# Patient Record
Sex: Female | Born: 1935
Health system: Southern US, Community
[De-identification: ages and names within clinical notes are randomized; demographics above are authoritative.]

## PROBLEM LIST (undated history)

## (undated) DIAGNOSIS — E039 Hypothyroidism, unspecified: Secondary | ICD-10-CM

## (undated) DIAGNOSIS — N182 Chronic kidney disease, stage 2 (mild): Secondary | ICD-10-CM

## (undated) DIAGNOSIS — J189 Pneumonia, unspecified organism: Secondary | ICD-10-CM

## (undated) DIAGNOSIS — F329 Major depressive disorder, single episode, unspecified: Secondary | ICD-10-CM

## (undated) DIAGNOSIS — J209 Acute bronchitis, unspecified: Secondary | ICD-10-CM

## (undated) DIAGNOSIS — N390 Urinary tract infection, site not specified: Secondary | ICD-10-CM

## (undated) DIAGNOSIS — I129 Hypertensive chronic kidney disease with stage 1 through stage 4 chronic kidney disease, or unspecified chronic kidney disease: Secondary | ICD-10-CM

## (undated) DIAGNOSIS — E785 Hyperlipidemia, unspecified: Secondary | ICD-10-CM

## (undated) DIAGNOSIS — I10 Essential (primary) hypertension: Secondary | ICD-10-CM

## (undated) DIAGNOSIS — R059 Cough, unspecified: Secondary | ICD-10-CM

## (undated) DIAGNOSIS — M810 Age-related osteoporosis without current pathological fracture: Secondary | ICD-10-CM

## (undated) DIAGNOSIS — N189 Chronic kidney disease, unspecified: Secondary | ICD-10-CM

## (undated) DIAGNOSIS — R05 Cough: Secondary | ICD-10-CM

## (undated) HISTORY — DX: Hyperlipidemia, unspecified: E78.5

## (undated) HISTORY — DX: Hypothyroidism, unspecified: E03.9

## (undated) HISTORY — DX: Chronic kidney disease, unspecified: N18.9

## (undated) HISTORY — DX: Urinary tract infection, site not specified: N39.0

## (undated) HISTORY — DX: Pneumonia, unspecified organism: J18.9

## (undated) HISTORY — DX: Major depressive disorder, single episode, unspecified: F32.9

## (undated) HISTORY — DX: Acute bronchitis, unspecified: J20.9

## (undated) HISTORY — DX: Hypertensive chronic kidney disease with stage 1 through stage 4 chronic kidney disease, or unspecified chronic kidney disease: I12.9

## (undated) HISTORY — DX: Chronic kidney disease, stage 2 (mild): N18.2

## (undated) HISTORY — DX: Cough, unspecified: R05.9

## (undated) HISTORY — DX: Essential (primary) hypertension: I10

## (undated) HISTORY — DX: Age-related osteoporosis without current pathological fracture: M81.0

---
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
## 2008-06-24 ENCOUNTER — Ambulatory Visit (HOSPITAL_COMMUNITY): Admission: RE | Admit: 2008-06-24 | Discharge: 2008-06-24 | Payer: Self-pay | Admitting: Pulmonary Disease

## 2008-07-04 ENCOUNTER — Emergency Department (HOSPITAL_COMMUNITY): Admission: EM | Admit: 2008-07-04 | Discharge: 2008-07-04 | Payer: Self-pay | Admitting: Emergency Medicine

## 2009-04-14 ENCOUNTER — Encounter: Payer: Self-pay | Admitting: Orthopedic Surgery

## 2009-04-14 ENCOUNTER — Emergency Department (HOSPITAL_COMMUNITY): Admission: EM | Admit: 2009-04-14 | Discharge: 2009-04-14 | Payer: Self-pay | Admitting: Emergency Medicine

## 2009-04-15 ENCOUNTER — Ambulatory Visit: Payer: Self-pay | Admitting: Orthopedic Surgery

## 2009-04-15 DIAGNOSIS — S52539A Colles' fracture of unspecified radius, initial encounter for closed fracture: Secondary | ICD-10-CM | POA: Insufficient documentation

## 2009-04-21 ENCOUNTER — Ambulatory Visit: Payer: Self-pay | Admitting: Orthopedic Surgery

## 2009-04-30 ENCOUNTER — Ambulatory Visit: Payer: Self-pay | Admitting: Orthopedic Surgery

## 2009-05-28 ENCOUNTER — Ambulatory Visit: Payer: Self-pay | Admitting: Orthopedic Surgery

## 2009-06-25 ENCOUNTER — Telehealth: Payer: Self-pay | Admitting: Orthopedic Surgery

## 2009-07-10 ENCOUNTER — Ambulatory Visit: Payer: Self-pay | Admitting: Orthopedic Surgery

## 2010-06-02 NOTE — Assessment & Plan Note (Signed)
Summary: RE-CK/XRAY WRIST/FX CARE/HUMANA/CAF   Visit Type:  Follow-up  CC:  right wrist fracture.  History of Present Illness:  DOI: 04/14/09 right wrist fracture.  TREATMENT: Cast, then brace  Not taking any medicines right now  Complains of no pain  Exam shows 50 extension 60 flexion of the RIGHT wrist no tenderness at the fracture site  X-rays ordered 3 views RIGHT wrist  X-ray show healed distal radius fracture mild alignment changes vs. normal wrist but acceptable  Doing well follow up as needed       Allergies: No Known Drug Allergies   Impression & Recommendations:  Problem # 1:  COLLES' FRACTURE, RIGHT (ICD-813.41) Assessment Improved  Orders: Post-Op Check (16109) Wrist x-ray complete, minimum 3 views (60454)  Patient Instructions: 1)  Please schedule a follow-up appointment as needed.

## 2010-06-02 NOTE — Assessment & Plan Note (Signed)
Summary: RE-CK/XRAY RT WRIST/HUMANA/CAF   Visit Type:  Follow-up  CC:  right wrist fracture.Marland Kitchen  History of Present Illness:  DOI: 04/14/09 right wrist fracture.  TREATMENT: Cast  MEDS: None  Scheduled for:  Xrays OOP.  xrays Show that the fracture still has just a hint of visibility so I've recommended a brace  AP lateral oblique RIGHT wrist shows a distal radius fracture which is healing with good callus formation, alignment is normal  Impression healing distal radius fracture    Allergies: No Known Drug Allergies   Impression & Recommendations:  Problem # 1:  COLLES' FRACTURE, RIGHT (ICD-813.41) Assessment Improved  Orders: Post-Op Check (09811) Wrist x-ray complete, minimum 3 views (91478)  Patient Instructions: 1)  Splint x 4 weeks then re-xray right wrist

## 2010-06-02 NOTE — Progress Notes (Signed)
Summary: postpone appointment  Phone Note Call from Patient   Summary of Call: Catherine West (04/10/1921) not able to come to her 4 week follow-up with  xr appointment, said she will call back when she feels better

## 2013-10-14 ENCOUNTER — Emergency Department (HOSPITAL_COMMUNITY): Payer: Medicare Other

## 2013-10-14 ENCOUNTER — Emergency Department (HOSPITAL_COMMUNITY)
Admission: EM | Admit: 2013-10-14 | Discharge: 2013-10-14 | Disposition: A | Discharge status: Home / Self Care | Payer: Medicare Other | Attending: Emergency Medicine | Admitting: Emergency Medicine

## 2013-10-14 ENCOUNTER — Encounter (HOSPITAL_COMMUNITY): Payer: Self-pay | Admitting: Emergency Medicine

## 2013-10-14 DIAGNOSIS — M25519 Pain in unspecified shoulder: Secondary | ICD-10-CM | POA: Insufficient documentation

## 2013-10-14 DIAGNOSIS — I10 Essential (primary) hypertension: Secondary | ICD-10-CM | POA: Insufficient documentation

## 2013-10-14 DIAGNOSIS — R079 Chest pain, unspecified: Secondary | ICD-10-CM | POA: Insufficient documentation

## 2013-10-14 DIAGNOSIS — Z79899 Other long term (current) drug therapy: Secondary | ICD-10-CM | POA: Insufficient documentation

## 2013-10-14 DIAGNOSIS — M549 Dorsalgia, unspecified: Secondary | ICD-10-CM

## 2013-10-14 DIAGNOSIS — M7520 Bicipital tendinitis, unspecified shoulder: Secondary | ICD-10-CM | POA: Insufficient documentation

## 2013-10-14 DIAGNOSIS — M7522 Bicipital tendinitis, left shoulder: Secondary | ICD-10-CM

## 2013-10-14 HISTORY — DX: Essential (primary) hypertension: I10

## 2013-10-14 LAB — CBC WITH DIFFERENTIAL/PLATELET
BASOS ABS: 0 10*3/uL (ref 0.0–0.1)
Basophils Relative: 0 % (ref 0–1)
EOS PCT: 0 % (ref 0–5)
Eosinophils Absolute: 0 10*3/uL (ref 0.0–0.7)
HCT: 43 % (ref 36.0–46.0)
Hemoglobin: 14.6 g/dL (ref 12.0–15.0)
LYMPHS ABS: 1.5 10*3/uL (ref 0.7–4.0)
LYMPHS PCT: 14 % (ref 12–46)
MCH: 33 pg (ref 26.0–34.0)
MCHC: 34 g/dL (ref 30.0–36.0)
MCV: 97.3 fL (ref 78.0–100.0)
Monocytes Absolute: 1.3 10*3/uL — ABNORMAL HIGH (ref 0.1–1.0)
Monocytes Relative: 13 % — ABNORMAL HIGH (ref 3–12)
NEUTROS ABS: 7.6 10*3/uL (ref 1.7–7.7)
NEUTROS PCT: 73 % (ref 43–77)
PLATELETS: 171 10*3/uL (ref 150–400)
RBC: 4.42 MIL/uL (ref 3.87–5.11)
RDW: 13.6 % (ref 11.5–15.5)
WBC: 10.4 10*3/uL (ref 4.0–10.5)

## 2013-10-14 LAB — BASIC METABOLIC PANEL
BUN: 18 mg/dL (ref 6–23)
CALCIUM: 8.9 mg/dL (ref 8.4–10.5)
CO2: 25 meq/L (ref 19–32)
CREATININE: 0.85 mg/dL (ref 0.50–1.10)
Chloride: 102 mEq/L (ref 96–112)
GFR, EST AFRICAN AMERICAN: 67 mL/min — AB (ref >90)
GFR, EST NON AFRICAN AMERICAN: 58 mL/min — AB (ref >90)
Glucose, Bld: 109 mg/dL — ABNORMAL HIGH (ref 70–99)
Potassium: 4.1 mEq/L (ref 3.7–5.3)
SODIUM: 139 meq/L (ref 137–147)

## 2013-10-14 LAB — TROPONIN I

## 2013-10-14 MED ORDER — IOHEXOL 300 MG/ML  SOLN
80.0000 mL | Freq: Once | INTRAMUSCULAR | Status: AC | PRN
  Administered 2013-10-14: 80 mL via INTRAVENOUS

## 2013-10-14 NOTE — Discharge Instructions (Signed)
CT scan of chest shows inflammation and possible scarring.   Radiologist recommends a repeat CT scan in 3-6 months.     Discuss this with Dr. Juanetta GoslingHawkins.      He can arrange followup.

## 2013-10-14 NOTE — ED Provider Notes (Signed)
CSN: [id number]     Arrival date & time 10/14/13  1416 History  This chart was scribed for Donnetta Hutching, MD by Karle Plumber, ED Scribe. This patient was seen in room [facility] and the patient's care was started at 3:00 PM.  Chief Complaint  Patient presents with  . Back Pain   The history is provided by the patient. No language interpreter was used.   HPI Comments:  Catherine West is a 80 y.o. female with h/o HTN who presents to the Emergency Department complaining of unchanging moderate left shoulder and mid back pain at T9 area upon inspiration that started approximately three weeks ago. She states that lying makes her back pain worse. Movement of her left arm makes the shoulder pain worse. Pt states her daughter made her come in today. Daughter states she called Dr. Romeo Apple, her orthopedist she has seen previously, and has an appt in the next 10 days. She denies fever, SOB, CP, or weakness. She states her PCP is Dr. Juanetta Gosling.  Past Medical History  Diagnosis Date  . HTN (hypertension)    History reviewed. No pertinent past surgical history. No family history on file. History  Substance Use Topics  . Smoking status: Never Smoker   . Smokeless tobacco: Never Used  . Alcohol Use: No   OB History   Grav Para Term Preterm Abortions TAB SAB Ect Mult Living                 Review of Systems A complete 10 system review of systems was obtained and all systems are negative except as noted in the HPI and PMH.   Allergies  Aspirin  Home Medications   Prior to Admission medications   Medication Sig Start Date End Date Taking? Authorizing Provider  BYSTOLIC 10 MG tablet Take 10 mg by mouth daily. 10/03/13  Yes Historical Provider, MD  sertraline (ZOLOFT) 50 MG tablet Take 50 mg by mouth every other day.  07/28/13  Yes Historical Provider, MD   Triage Vitals: BP 219/65  Pulse 57  Temp(Src) 97.8 F (36.6 C) (Oral)  Resp 20  Ht 5\' 4"  (1.626 m)  Wt 142 lb (64.411 kg)  BMI 24.36  kg/m2  SpO2 93% Physical Exam  Nursing note and vitals reviewed. Constitutional: She is oriented to person, place, and time. She appears well-developed and well-nourished.  HENT:  Head: Normocephalic and atraumatic.  Eyes: Conjunctivae and EOM are normal. Pupils are equal, round, and reactive to light.  Neck: Normal range of motion. Neck supple.  Cardiovascular: Normal rate, regular rhythm and normal heart sounds.   Pulmonary/Chest: Effort normal and breath sounds normal.  Abdominal: Soft. Bowel sounds are normal.  Musculoskeletal: Normal range of motion. She exhibits tenderness. She exhibits no edema.  Proximal medial humerus tendon tenderness. Full ROM. Right sided back nontender.  Neurological: She is alert and oriented to person, place, and time.  Skin: Skin is warm and dry.  Psychiatric: She has a normal mood and affect. Her behavior is normal.    ED Course  Procedures (including critical care time) DIAGNOSTIC STUDIES: Oxygen Saturation is 93% on RA, low by my interpretation.   COORDINATION OF CARE: 3:07 PM- Will order CXR and lab work. Pt verbalizes understanding and agrees to plan.  Medications  iohexol (OMNIPAQUE) 300 MG/ML solution 80 mL (80 mLs Intravenous Contrast Given 10/14/13 1648)    Labs Review Labs Reviewed  BASIC METABOLIC PANEL - Abnormal; Notable for the following:    Glucose, Bld  109 (*)    GFR calc non Af Amer 58 (*)    GFR calc Af Amer 67 (*)    All other components within normal limits  CBC WITH DIFFERENTIAL - Abnormal; Notable for the following:    Monocytes Relative 13 (*)    Monocytes Absolute 1.3 (*)    All other components within normal limits  TROPONIN I    Imaging Review Dg Chest 2 View  10/14/2013   CLINICAL DATA:  Right back shoulder and scapular pain. Intermittent fever.  EXAM: CHEST  2 VIEW  COMPARISON:  Two-view chest 06/24/2008.  FINDINGS: Mild cardiomegaly is present. Right-sided pleural thickening or loculated effusion is noted.  Mild biapical scarring is otherwise stable. Asymmetric right upper lobe airspace disease is evident.  A remote T4 compression fracture is evident. The visualized soft tissues and bony thorax are otherwise unremarkable. Atherosclerotic calcifications are noted in the aortic arch.  IMPRESSION: 1. Right upper lobe pleural opacification. This may represent a loculated effusion or infection. A chest wall mass is not excluded. Recommend CT of the chest with contrast for further evaluation. 2. Asymmetric right upper lobe airspace disease may represent atelectasis or early infection. 3. Borderline cardiomegaly without failure. 4. Atherosclerosis.   Electronically Signed   By: Gennette Pachris  Mattern M.D.   On: 10/14/2013 15:47   Ct Chest W Contrast  10/14/2013   CLINICAL DATA:  Left shoulder and arm pain.  Abnormal chest x-ray.  EXAM: CT CHEST WITH CONTRAST  TECHNIQUE: Multidetector CT imaging of the chest was performed during intravenous contrast administration.  CONTRAST:  80mL OMNIPAQUE IOHEXOL 300 MG/ML  SOLN  COMPARISON:  Radiographs today, 07/04/2008 and 06/24/2008.  FINDINGS: There is patchy subpleural airspace disease at the right apex, corresponding with the radiographic density. No well-defined mass or chest wall extension is identified. The appearance is most consistent with postinflammatory scarring although a degree of active inflammation not excluded. Small nodular densities are present elsewhere in the right upper, right middle and left upper lobes.  The heart is enlarged. There is atherosclerosis of the aorta, great vessels and coronary arteries. There are mildly enlarged superior mediastinal lymph nodes, including a high right paratracheal node measuring 11 mm short axis on coronal image 47. There is a 10 mm pre tracheal node on image 20. No hilar adenopathy is apparent. There is no pleural or pericardial effusion.  Images through the upper abdomen demonstrate an 11 mm left adrenal nodule, likely an adenoma.  There is atherosclerosis of the abdominal aorta.  Chronic T4 compression deformity is again noted. No acute osseous findings are seen.  IMPRESSION: 1. Right apical subpleural density most likely represents postinflammatory scarring, although could indicate active inflammation. No focal mass or chest wall extension is identified. 2. Nonspecific mildly enlarged superior mediastinal lymph nodes. 3. No pleural or pericardial effusion. 4. Scattered atherosclerosis. 5. To evaluate the stability of these findings, chest CT follow-up in 3-6 months is recommended.   Electronically Signed   By: Roxy HorsemanBill  Veazey M.D.   On: 10/14/2013 17:07     EKG Interpretation   Date/Time:  Sunday October 14 2013 14:42:44 EDT Ventricular Rate:  54 PR Interval:  210 QRS Duration: 145 QT Interval:  478 QTC Calculation: 453 R Axis:   -44 Text Interpretation:  Sinus rhythm Left bundle branch block Baseline  wander in lead(s) V3 Confirmed by Thora Scherman  MD, Ahlia Lemanski ([id number]) on 10/14/2013  3:51:48 PM      MDM   Final diagnoses:  Back pain  Biceps  tendinitis on left   CT chest with contrast reveals a right apical subpleural density most likely postinflammatory scarring.  This was discussed with the patient and her daughter. She will follow up with her primary care Dr. Juanetta GoslingHawkins for a followup CT chest in 3-6 months. She is hemodynamically stable.  I personally performed the services described in this documentation, which was scribed in my presence. The recorded information has been reviewed and is accurate.    Donnetta HutchingBrian Sayge Salvato, MD 10/15/13 1058

## 2013-10-14 NOTE — ED Notes (Signed)
Patient with no complaints at this time. Respirations even and unlabored. Skin warm/dry. Discharge instructions reviewed with patient at this time. Patient given opportunity to voice concerns/ask questions. IV removed per policy and band-aid applied to site. Patient discharged at this time and left Emergency Department with steady gait.  

## 2013-10-14 NOTE — ED Notes (Signed)
PT c/o back pain when pt breathes in for about a week. PT c/o left shoulder pain x3 weeks.

## 2013-10-18 ENCOUNTER — Telehealth: Payer: Self-pay | Admitting: Orthopedic Surgery

## 2013-10-18 NOTE — Telephone Encounter (Signed)
Patient called to cancel her upcoming appointment, for 11/20/13 for shoulder pain. States feeling better, said had gone to Emergency Room, had Xrays there, and was told "tendonitis."  I offered to keep this visit so that she can be evaluated orthopedically, states will just call back to re-schedule if needed.

## 2013-11-20 ENCOUNTER — Ambulatory Visit: Payer: Medicare Other | Admitting: Orthopedic Surgery

## 2015-02-23 ENCOUNTER — Emergency Department (HOSPITAL_COMMUNITY)
Admission: EM | Admit: 2015-02-23 | Discharge: 2015-02-23 | Disposition: A | Discharge status: Home / Self Care | Payer: Medicare Other | Attending: Emergency Medicine | Admitting: Emergency Medicine

## 2015-02-23 ENCOUNTER — Encounter (HOSPITAL_COMMUNITY): Payer: Self-pay | Admitting: Emergency Medicine

## 2015-02-23 DIAGNOSIS — I10 Essential (primary) hypertension: Secondary | ICD-10-CM | POA: Diagnosis not present

## 2015-02-23 DIAGNOSIS — Z79899 Other long term (current) drug therapy: Secondary | ICD-10-CM | POA: Insufficient documentation

## 2015-02-23 LAB — CBC
HCT: 45.6 % (ref 36.0–46.0)
Hemoglobin: 15.3 g/dL — ABNORMAL HIGH (ref 12.0–15.0)
MCH: 33.3 pg (ref 26.0–34.0)
MCHC: 33.6 g/dL (ref 30.0–36.0)
MCV: 99.3 fL (ref 78.0–100.0)
Platelets: 191 K/uL (ref 150–400)
RBC: 4.59 MIL/uL (ref 3.87–5.11)
RDW: 13.5 % (ref 11.5–15.5)
WBC: 8.8 K/uL (ref 4.0–10.5)

## 2015-02-23 LAB — TROPONIN I

## 2015-02-23 LAB — BASIC METABOLIC PANEL WITH GFR
Anion gap: 7 (ref 5–15)
BUN: 18 mg/dL (ref 6–20)
CO2: 28 mmol/L (ref 22–32)
Calcium: 9.4 mg/dL (ref 8.9–10.3)
Chloride: 107 mmol/L (ref 101–111)
Creatinine, Ser: 1.01 mg/dL — ABNORMAL HIGH (ref 0.44–1.00)
GFR calc Af Amer: 54 mL/min — ABNORMAL LOW
GFR calc non Af Amer: 47 mL/min — ABNORMAL LOW
Glucose, Bld: 94 mg/dL (ref 65–99)
Potassium: 4.3 mmol/L (ref 3.5–5.1)
Sodium: 142 mmol/L (ref 135–145)

## 2015-02-23 MED ORDER — HYDRALAZINE HCL 20 MG/ML IJ SOLN
INTRAMUSCULAR | Status: AC
  Administered 2015-02-23: 10 mg via INTRAVENOUS
  Filled 2015-02-23: qty 1

## 2015-02-23 MED ORDER — HYDRALAZINE HCL 20 MG/ML IJ SOLN
10.0000 mg | Freq: Once | INTRAMUSCULAR | Status: AC
  Administered 2015-02-23: 10 mg via INTRAVENOUS

## 2015-02-23 MED ORDER — LISINOPRIL 10 MG PO TABS: 10.0000 mg | ORAL_TABLET | Freq: Every day | ORAL | Status: DC

## 2015-02-23 NOTE — ED Notes (Signed)
Patient states she woke this morning at 0830 with dizziness. States she checked her blood [facility] and [facility]. Patient denies pain. States dizziness has resolved since.

## 2015-02-23 NOTE — Discharge Instructions (Signed)

## 2015-02-23 NOTE — ED Provider Notes (Signed)
CSN: [id number]     Arrival date & time 02/23/15  1207 History   First MD Initiated Contact with Patient 02/23/15 1242     Chief Complaint  Patient presents with  . Hypertension     (Consider location/radiation/quality/duration/timing/severity/associated sxs/prior Treatment) HPI Comments: The patient is a 81 year old West, she has a history of hypertension which has been well-controlled on Bystolic for 7 years.  She reports that this morning she had 2 discrete episodes where she felt off balance and dizzy. She reports that this was a lightheadedness, there was no vertigo, no nausea, no weakness or numbness and no changes in her vision. It was short-lived each time, she had her family members take her to the pharmacy where it was found that her blood pressure was 223/120 as taken manually by a provider at [facility]. She reports that all of her symptoms have resolved, she had no chest pain palpitation shortness of breath back pain neck pain abdominal pain diarrhea rectal bleeding dysuria swelling rashes numbness or weakness. There was never any difficulty speaking or changes in vision. At this time she has no complaints. She has not missed any of her medications, she has not started any new medications.  Patient is a 81 y.o. West presenting with hypertension. The history is provided by the patient and a relative.  Hypertension    Past Medical History  Diagnosis Date  . HTN (hypertension)    History reviewed. No pertinent past surgical history. History reviewed. No pertinent family history. Social History  Substance Use Topics  . Smoking status: Never Smoker   . Smokeless tobacco: Never Used  . Alcohol Use: No   OB History    No data available     Review of Systems  All other systems reviewed and are negative.     Allergies  Aspirin  Home Medications   Prior to Admission medications   Medication Sig Start Date End Date Taking? Authorizing Provider  BYSTOLIC 10 MG  tablet Take 10 mg by mouth daily. 10/03/13  Yes Historical Provider, MD  sertraline (ZOLOFT) 50 MG tablet Take 25 mg by mouth every other day.  07/28/13  Yes Historical Provider, MD  lisinopril (PRINIVIL,ZESTRIL) 10 MG tablet Take 1 tablet (10 mg total) by mouth daily. 02/23/15   Eber Hong, MD   BP 144/63 mmHg  Pulse 60  Temp(Src) 97.8 F (36.6 C) (Oral)  Resp 21  Ht  (1.651 m)  Wt 142 lb (64.411 kg)  BMI 23.63 kg/m2  SpO2 93% Physical Exam  Constitutional: She appears well-developed and well-nourished. No distress.  HENT:  Head: Normocephalic and atraumatic.  Mouth/Throat: Oropharynx is clear and moist. No oropharyngeal exudate.  Eyes: Conjunctivae and EOM are normal. Pupils are equal, round, and reactive to light. Right eye exhibits no discharge. Left eye exhibits no discharge. No scleral icterus.  Neck: Normal range of motion. Neck supple. No JVD present. No thyromegaly present.  Cardiovascular: Normal rate, regular rhythm, normal heart sounds and intact distal pulses.  Exam reveals no gallop and no friction rub.   No murmur heard. Pulmonary/Chest: Effort normal and breath sounds normal. No respiratory distress. She has no wheezes. She has no rales.  Abdominal: Soft. Bowel sounds are normal. She exhibits no distension and no mass. There is no tenderness.  Musculoskeletal: Normal range of motion. She exhibits no edema or tenderness.  Lymphadenopathy:    She has no cervical adenopathy.  Neurological: She is alert. Coordination normal.  Speech is clear, cranial nerves III  through XII are intact, memory is intact, strength is normal in all 4 extremities including grips, sensation is intact to light touch and pinprick in all 4 extremities. Coordination as tested by finger-nose-finger is normal, no limb ataxia. Normal gait, normal reflexes at the patellar tendons bilaterally  Skin: Skin is warm and dry. No rash noted. No erythema.  Psychiatric: She has a normal mood and affect. Her  behavior is normal.  Nursing note and vitals reviewed.   ED Course  Procedures (including critical care time) Labs Review Labs Reviewed  CBC - Abnormal; Notable for the following:    Hemoglobin 15.3 (*)    All other components within normal limits  BASIC METABOLIC PANEL - Abnormal; Notable for the following:    Creatinine, Ser 1.01 (*)    GFR calc non Af Amer 47 (*)    GFR calc Af Amer 54 (*)    All other components within normal limits  TROPONIN I    Imaging Review No results found. I have personally reviewed and evaluated these images and lab results as part of my medical decision-making.   EKG Interpretation   Date/Time:  Sunday February 23 2015 13:15:42 EDT Ventricular Rate:  61 PR Interval:  205 QRS Duration: 148 QT Interval:  497 QTC Calculation: 501 R Axis:   21 Text Interpretation:  Sinus rhythm Left bundle branch block since last  tracing no significant change Confirmed by Duvan Mousel  MD, Brin Ruggerio ([id number]) on  02/23/2015 1:27:49 PM      MDM   Final diagnoses:  Essential hypertension    The patient has a benign exam, her blood pressure is severely elevated, this will need to be rechecked, check kidney function, EKG and troponin, patient may need to have increased medication coverage for blood pressure, will ambulate after labs obtained. She has no symptoms at this time. Reassuring exam.  After a single dose of blood pressure medication, the patient improved significantly, has no more symptoms, is feeling well and is stable for discharge. I reviewed all the patient's labs with her, she has been given a copy of her results and will follow-up with Dr. Juanetta GoslingHawkins this week. She will start lisinopril, I discussed with her the risk of angioedema, she is agreeable to this risk and will start taking lisinopril tomorrow.  Meds given in ED:  Medications  hydrALAZINE (APRESOLINE) injection 10 mg (10 mg Intravenous Given 02/23/15 1400)    New Prescriptions   LISINOPRIL  (PRINIVIL,ZESTRIL) 10 MG TABLET    Take 1 tablet (10 mg total) by mouth daily.        Eber HongBrian Shavonne Ambroise, MD 02/23/15 [phone]

## 2015-02-25 ENCOUNTER — Encounter (HOSPITAL_BASED_OUTPATIENT_CLINIC_OR_DEPARTMENT_OTHER): Payer: Self-pay | Admitting: *Deleted

## 2015-02-25 ENCOUNTER — Emergency Department (HOSPITAL_BASED_OUTPATIENT_CLINIC_OR_DEPARTMENT_OTHER)
Admission: EM | Admit: 2015-02-25 | Discharge: 2015-02-25 | Disposition: A | Discharge status: Home / Self Care | Payer: Medicare Other | Attending: Emergency Medicine | Admitting: Emergency Medicine

## 2015-02-25 DIAGNOSIS — Z79899 Other long term (current) drug therapy: Secondary | ICD-10-CM | POA: Diagnosis not present

## 2015-02-25 DIAGNOSIS — I159 Secondary hypertension, unspecified: Secondary | ICD-10-CM | POA: Insufficient documentation

## 2015-02-25 DIAGNOSIS — I10 Essential (primary) hypertension: Secondary | ICD-10-CM | POA: Diagnosis present

## 2015-02-25 NOTE — ED Notes (Signed)
States she was seen at [facility] 2 days ago after waking up with dizziness. Her BP was elevated and she was treated for HTN and inner ear. Her MD increased her BP medication yesterday. [facility] and [facility]. Her daughter brought her here. Pt does not have any complaints.

## 2015-02-25 NOTE — ED Provider Notes (Signed)
CSN: [id number]     Arrival date & time 02/25/15  1351 History   First MD Initiated Contact with Patient 02/25/15 1403     Chief Complaint  Patient presents with  . Hypertension     (Consider location/radiation/quality/duration/timing/severity/associated sxs/prior Treatment) HPI Comments: Patient recently seen for high blood pressure. She is taking one blood pressure medication for many years and was seen in the ER 2 days ago for very elevated blood pressure. She was given IV hydralazine with improvement in her blood pressure and went home with a new prescription for lisinopril. She saw her doctor yesterday who increased her lisinopril. She went [facility] for a BP check and it was elevated again today and they sent her to the ER. Patient feels well, she denies any CP, SOB, N/V, headache.   Patient is a 81 y.o. female presenting with hypertension. The history is provided by the patient.  Hypertension This is a chronic problem. The current episode started more than 1 week ago. The problem occurs constantly. The problem has not changed since onset.Pertinent negatives include no shortness of breath. Nothing aggravates the symptoms. Nothing relieves the symptoms.    Past Medical History  Diagnosis Date  . HTN (hypertension)    History reviewed. No pertinent past surgical history. No family history on file. Social History  Substance Use Topics  . Smoking status: Never Smoker   . Smokeless tobacco: Never Used  . Alcohol Use: No   OB History    No data available     Review of Systems  Constitutional: Negative for fever.  Respiratory: Negative for cough and shortness of breath.   All other systems reviewed and are negative.     Allergies  Aspirin  Home Medications   Prior to Admission medications   Medication Sig Start Date End Date Taking? Authorizing Provider  BYSTOLIC 10 MG tablet Take 10 mg by mouth daily. 10/03/13   Historical Provider, MD  lisinopril (PRINIVIL,ZESTRIL)  10 MG tablet Take 1 tablet (10 mg total) by mouth daily. 02/23/15   Eber HongBrian Miller, MD  sertraline (ZOLOFT) 50 MG tablet Take 25 mg by mouth every other day.  07/28/13   Historical Provider, MD   BP 236/96 mmHg  Pulse 66  Temp(Src) 98.4 F (36.9 C) (Oral)  Resp 20  Wt 142 lb (64.411 kg)  SpO2 95% Physical Exam  Constitutional: She is oriented to person, place, and time. She appears well-developed and well-nourished. No distress.  HENT:  Head: Normocephalic and atraumatic.  Mouth/Throat: Oropharynx is clear and moist.  Eyes: EOM are normal. Pupils are equal, round, and reactive to light.  Neck: Normal range of motion. Neck supple.  Cardiovascular: Normal rate and regular rhythm.  Exam reveals no friction rub.   No murmur heard. Pulmonary/Chest: Effort normal and breath sounds normal. No respiratory distress. She has no wheezes. She has no rales.  Abdominal: Soft. She exhibits no distension. There is no tenderness. There is no rebound.  Musculoskeletal: Normal range of motion. She exhibits no edema.  Neurological: She is alert and oriented to person, place, and time.  Skin: No rash noted. She is not diaphoretic.  Nursing note and vitals reviewed.   ED Course  Procedures (including critical care time) Labs Review Labs Reviewed - No data to display  Imaging Review No results found. I have personally reviewed and evaluated these images and lab results as part of my medical decision-making.   EKG Interpretation None      MDM   Final  diagnoses:  Secondary hypertension, unspecified    Patient is here with high blood pressure. She's been seen 3 times over the past 3 days for her high blood pressure. She saw her primary care doctor yesterday who increased her lisinopril after being seen in the ER the day before. She's here again with high blood pressure. She feels well and has no symptoms. I spoke with her doctor, Dr. Juanetta Gosling, who recommended she continue the blood pressure is able  take time for it to drop her blood pressure. He recommended checking her blood pressure at home and not inappropriate place with they will always recommend going to the ER. I agree with this as the patient feels well.  Exam here benign. Stable for discharge.    Elwin Mocha, MD 02/25/15 [phone]

## 2015-02-25 NOTE — ED Notes (Signed)
Pt. Just started on MEDS yesterday.

## 2015-02-25 NOTE — Discharge Instructions (Signed)
Hypertension Hypertension, commonly called high blood pressure, is when the force of blood pumping through your arteries is too strong. Your arteries are the blood vessels that carry blood from your heart throughout your body. A blood pressure reading consists of a higher number over a lower number, such as 110/72. The higher number (systolic) is the pressure inside your arteries when your heart pumps. The lower number (diastolic) is the pressure inside your arteries when your heart relaxes. Ideally you want your blood pressure below 120/80. Hypertension forces your heart to work harder to pump blood. Your arteries may become narrow or stiff. Having untreated or uncontrolled hypertension can cause heart attack, stroke, kidney disease, and other problems. RISK FACTORS Some risk factors for high blood pressure are controllable. Others are not.  Risk factors you cannot control include:   Race. You may be at higher risk if you are African American.  Age. Risk increases with age.  Gender. Men are at higher risk than women before age 45 years. After age 65, women are at higher risk than men. Risk factors you can control include:  Not getting enough exercise or physical activity.  Being overweight.  Getting too much fat, sugar, calories, or salt in your diet.  Drinking too much alcohol. SIGNS AND SYMPTOMS Hypertension does not usually cause signs or symptoms. Extremely high blood pressure (hypertensive crisis) may cause headache, anxiety, shortness of breath, and nosebleed. DIAGNOSIS To check if you have hypertension, your health care provider will measure your blood pressure while you are seated, with your arm held at the level of your heart. It should be measured at least twice using the same arm. Certain conditions can cause a difference in blood pressure between your right and left arms. A blood pressure reading that is higher than normal on one occasion does not mean that you need treatment. If  it is not clear whether you have high blood pressure, you may be asked to return on a different day to have your blood pressure checked again. Or, you may be asked to monitor your blood pressure at home for 1 or more weeks. TREATMENT Treating high blood pressure includes making lifestyle changes and possibly taking medicine. Living a healthy lifestyle can help lower high blood pressure. You may need to change some of your habits. Lifestyle changes may include:  Following the DASH diet. This diet is high in fruits, vegetables, and whole grains. It is low in salt, red meat, and added sugars.  Keep your sodium intake below 2,300 mg per day.  Getting at least 30-45 minutes of aerobic exercise at least 4 times per week.  Losing weight if necessary.  Not smoking.  Limiting alcoholic beverages.  Learning ways to reduce stress. Your health care provider may prescribe medicine if lifestyle changes are not enough to get your blood pressure under control, and if one of the following is true:  You are 18-59 years of age and your systolic blood pressure is above 140.  You are 60 years of age or older, and your systolic blood pressure is above 150.  Your diastolic blood pressure is above 90.  You have diabetes, and your systolic blood pressure is over 140 or your diastolic blood pressure is over 90.  You have kidney disease and your blood pressure is above 140/90.  You have heart disease and your blood pressure is above 140/90. Your personal target blood pressure may vary depending on your medical conditions, your age, and other factors. HOME CARE INSTRUCTIONS    Have your blood pressure rechecked as directed by your health care provider.   Take medicines only as directed by your health care provider. Follow the directions carefully. Blood pressure medicines must be taken as prescribed. The medicine does not work as well when you skip doses. Skipping doses also puts you at risk for  problems.  Do not smoke.   Monitor your blood pressure at home as directed by your health care provider. SEEK MEDICAL CARE IF:   You think you are having a reaction to medicines taken.  You have recurrent headaches or feel dizzy.  You have swelling in your ankles.  You have trouble with your vision. SEEK IMMEDIATE MEDICAL CARE IF:  You develop a severe headache or confusion.  You have unusual weakness, numbness, or feel faint.  You have severe chest or abdominal pain.  You vomit repeatedly.  You have trouble breathing. MAKE SURE YOU:   Understand these instructions.  Will watch your condition.  Will get help right away if you are not doing well or get worse.   This information is not intended to replace advice given to you by your health care provider. Make sure you discuss any questions you have with your health care provider.   Document Released: 04/19/2005 Document Revised: 09/03/2014 Document Reviewed: 02/09/2013 Elsevier Interactive Patient Education 2016 [employer]  

## 2015-09-04 ENCOUNTER — Other Ambulatory Visit (HOSPITAL_COMMUNITY): Payer: Self-pay | Admitting: Pulmonary Disease

## 2015-09-04 DIAGNOSIS — Z780 Asymptomatic menopausal state: Secondary | ICD-10-CM

## 2015-09-11 ENCOUNTER — Ambulatory Visit (HOSPITAL_COMMUNITY)
Admission: RE | Admit: 2015-09-11 | Discharge: 2015-09-11 | Disposition: A | Discharge status: Home / Self Care | Payer: Medicare Other | Source: Ambulatory Visit | Attending: Pulmonary Disease | Admitting: Pulmonary Disease

## 2015-09-11 DIAGNOSIS — Z780 Asymptomatic menopausal state: Secondary | ICD-10-CM | POA: Insufficient documentation

## 2015-09-11 DIAGNOSIS — M810 Age-related osteoporosis without current pathological fracture: Secondary | ICD-10-CM | POA: Insufficient documentation

## 2015-09-11 LAB — HM DEXA SCAN

## 2015-10-10 LAB — FECAL OCCULT BLOOD, GUAIAC: Fecal Occult Blood: NEGATIVE

## 2016-02-24 DIAGNOSIS — Z23 Encounter for immunization: Secondary | ICD-10-CM | POA: Diagnosis not present

## 2016-03-08 DIAGNOSIS — E785 Hyperlipidemia, unspecified: Secondary | ICD-10-CM | POA: Diagnosis not present

## 2016-03-08 DIAGNOSIS — Z23 Encounter for immunization: Secondary | ICD-10-CM | POA: Diagnosis not present

## 2016-03-08 DIAGNOSIS — I10 Essential (primary) hypertension: Secondary | ICD-10-CM | POA: Diagnosis not present

## 2016-03-08 DIAGNOSIS — F321 Major depressive disorder, single episode, moderate: Secondary | ICD-10-CM | POA: Diagnosis not present

## 2016-05-04 DIAGNOSIS — I10 Essential (primary) hypertension: Secondary | ICD-10-CM | POA: Diagnosis not present

## 2016-05-04 DIAGNOSIS — F321 Major depressive disorder, single episode, moderate: Secondary | ICD-10-CM | POA: Diagnosis not present

## 2016-09-06 DIAGNOSIS — Z0000 Encounter for general adult medical examination without abnormal findings: Secondary | ICD-10-CM | POA: Diagnosis not present

## 2016-11-01 DIAGNOSIS — F321 Major depressive disorder, single episode, moderate: Secondary | ICD-10-CM | POA: Diagnosis not present

## 2016-11-01 DIAGNOSIS — E039 Hypothyroidism, unspecified: Secondary | ICD-10-CM | POA: Diagnosis not present

## 2016-11-01 DIAGNOSIS — I10 Essential (primary) hypertension: Secondary | ICD-10-CM | POA: Diagnosis not present

## 2016-11-01 DIAGNOSIS — E785 Hyperlipidemia, unspecified: Secondary | ICD-10-CM | POA: Diagnosis not present

## 2017-02-16 DIAGNOSIS — Z23 Encounter for immunization: Secondary | ICD-10-CM | POA: Diagnosis not present

## 2017-03-09 DIAGNOSIS — I10 Essential (primary) hypertension: Secondary | ICD-10-CM | POA: Diagnosis not present

## 2017-03-09 DIAGNOSIS — E785 Hyperlipidemia, unspecified: Secondary | ICD-10-CM | POA: Diagnosis not present

## 2017-03-09 DIAGNOSIS — N182 Chronic kidney disease, stage 2 (mild): Secondary | ICD-10-CM | POA: Diagnosis not present

## 2017-03-09 DIAGNOSIS — I129 Hypertensive chronic kidney disease with stage 1 through stage 4 chronic kidney disease, or unspecified chronic kidney disease: Secondary | ICD-10-CM | POA: Diagnosis not present

## 2017-05-05 DIAGNOSIS — I10 Essential (primary) hypertension: Secondary | ICD-10-CM | POA: Diagnosis not present

## 2017-05-05 DIAGNOSIS — N182 Chronic kidney disease, stage 2 (mild): Secondary | ICD-10-CM | POA: Diagnosis not present

## 2017-05-05 DIAGNOSIS — E785 Hyperlipidemia, unspecified: Secondary | ICD-10-CM | POA: Diagnosis not present

## 2017-09-08 DIAGNOSIS — Z0000 Encounter for general adult medical examination without abnormal findings: Secondary | ICD-10-CM | POA: Diagnosis not present

## 2018-02-22 ENCOUNTER — Emergency Department (HOSPITAL_COMMUNITY)
Admission: EM | Admit: 2018-02-22 | Discharge: 2018-02-22 | Disposition: A | Discharge status: Home / Self Care | Payer: Medicare Other | Attending: Emergency Medicine | Admitting: Emergency Medicine

## 2018-02-22 ENCOUNTER — Encounter (HOSPITAL_COMMUNITY): Payer: Self-pay | Admitting: Emergency Medicine

## 2018-02-22 ENCOUNTER — Emergency Department (HOSPITAL_COMMUNITY): Payer: Medicare Other

## 2018-02-22 ENCOUNTER — Other Ambulatory Visit: Payer: Self-pay

## 2018-02-22 DIAGNOSIS — S0003XA Contusion of scalp, initial encounter: Secondary | ICD-10-CM | POA: Insufficient documentation

## 2018-02-22 DIAGNOSIS — Y92008 Other place in unspecified non-institutional (private) residence as the place of occurrence of the external cause: Secondary | ICD-10-CM | POA: Insufficient documentation

## 2018-02-22 DIAGNOSIS — T148XXA Other injury of unspecified body region, initial encounter: Secondary | ICD-10-CM

## 2018-02-22 DIAGNOSIS — Y9389 Activity, other specified: Secondary | ICD-10-CM | POA: Diagnosis not present

## 2018-02-22 DIAGNOSIS — W1839XA Other fall on same level, initial encounter: Secondary | ICD-10-CM | POA: Diagnosis not present

## 2018-02-22 DIAGNOSIS — Y999 Unspecified external cause status: Secondary | ICD-10-CM | POA: Diagnosis not present

## 2018-02-22 DIAGNOSIS — N3001 Acute cystitis with hematuria: Secondary | ICD-10-CM | POA: Diagnosis not present

## 2018-02-22 DIAGNOSIS — S0291XA Unspecified fracture of skull, initial encounter for closed fracture: Secondary | ICD-10-CM | POA: Diagnosis not present

## 2018-02-22 DIAGNOSIS — Z79899 Other long term (current) drug therapy: Secondary | ICD-10-CM | POA: Insufficient documentation

## 2018-02-22 DIAGNOSIS — S0990XA Unspecified injury of head, initial encounter: Secondary | ICD-10-CM | POA: Diagnosis not present

## 2018-02-22 DIAGNOSIS — I10 Essential (primary) hypertension: Secondary | ICD-10-CM | POA: Insufficient documentation

## 2018-02-22 DIAGNOSIS — I447 Left bundle-branch block, unspecified: Secondary | ICD-10-CM | POA: Diagnosis not present

## 2018-02-22 DIAGNOSIS — R55 Syncope and collapse: Secondary | ICD-10-CM | POA: Diagnosis not present

## 2018-02-22 DIAGNOSIS — Z23 Encounter for immunization: Secondary | ICD-10-CM | POA: Diagnosis not present

## 2018-02-22 DIAGNOSIS — W19XXXA Unspecified fall, initial encounter: Secondary | ICD-10-CM | POA: Diagnosis not present

## 2018-02-22 DIAGNOSIS — R0902 Hypoxemia: Secondary | ICD-10-CM | POA: Diagnosis not present

## 2018-02-22 DIAGNOSIS — S199XXA Unspecified injury of neck, initial encounter: Secondary | ICD-10-CM | POA: Diagnosis not present

## 2018-02-22 LAB — CBC WITH DIFFERENTIAL/PLATELET
ABS IMMATURE GRANULOCYTES: 0.09 10*3/uL — AB (ref 0.00–0.07)
BASOS PCT: 0 %
Basophils Absolute: 0.1 10*3/uL (ref 0.0–0.1)
Eosinophils Absolute: 0.1 10*3/uL (ref 0.0–0.5)
Eosinophils Relative: 1 %
HEMATOCRIT: 44.3 % (ref 36.0–46.0)
HEMOGLOBIN: 14.4 g/dL (ref 12.0–15.0)
Immature Granulocytes: 1 %
LYMPHS ABS: 1.6 10*3/uL (ref 0.7–4.0)
Lymphocytes Relative: 10 %
MCH: 33.3 pg (ref 26.0–34.0)
MCHC: 32.5 g/dL (ref 30.0–36.0)
MCV: 102.3 fL — AB (ref 80.0–100.0)
MONO ABS: 1 10*3/uL (ref 0.1–1.0)
MONOS PCT: 6 %
NEUTROS ABS: 13.7 10*3/uL — AB (ref 1.7–7.7)
Neutrophils Relative %: 82 %
PLATELETS: 194 10*3/uL (ref 150–400)
RBC: 4.33 MIL/uL (ref 3.87–5.11)
RDW: 13.2 % (ref 11.5–15.5)
WBC: 16.5 10*3/uL — ABNORMAL HIGH (ref 4.0–10.5)
nRBC: 0 % (ref 0.0–0.2)

## 2018-02-22 LAB — URINALYSIS, ROUTINE W REFLEX MICROSCOPIC
Glucose, UA: NEGATIVE mg/dL
NITRITE: POSITIVE — AB
Protein, ur: >300 mg/dL — AB
Specific Gravity, Urine: 1.015 (ref 1.005–1.030)
pH: 6.5 (ref 5.0–8.0)

## 2018-02-22 LAB — COMPREHENSIVE METABOLIC PANEL
ALK PHOS: 87 U/L (ref 38–126)
ALT: 15 U/L (ref 0–44)
AST: 29 U/L (ref 15–41)
Albumin: 4 g/dL (ref 3.5–5.0)
Anion gap: 9 (ref 5–15)
BILIRUBIN TOTAL: 0.6 mg/dL (ref 0.3–1.2)
BUN: 26 mg/dL — AB (ref 8–23)
CALCIUM: 9.2 mg/dL (ref 8.9–10.3)
CO2: 27 mmol/L (ref 22–32)
Chloride: 101 mmol/L (ref 98–111)
Creatinine, Ser: 1.35 mg/dL — ABNORMAL HIGH (ref 0.44–1.00)
GFR, EST AFRICAN AMERICAN: 37 mL/min — AB (ref >60)
GFR, EST NON AFRICAN AMERICAN: 32 mL/min — AB (ref >60)
Glucose, Bld: 130 mg/dL — ABNORMAL HIGH (ref 70–99)
Potassium: 3.3 mmol/L — ABNORMAL LOW (ref 3.5–5.1)
Sodium: 137 mmol/L (ref 135–145)
TOTAL PROTEIN: 7.8 g/dL (ref 6.5–8.1)

## 2018-02-22 LAB — PROTIME-INR
INR: 0.95
PROTHROMBIN TIME: 12.6 s (ref 11.4–15.2)

## 2018-02-22 LAB — URINALYSIS, MICROSCOPIC (REFLEX): RBC / HPF: >50 RBC/hpf (ref 0–5)

## 2018-02-22 LAB — CK: Total CK: 130 U/L (ref 38–234)

## 2018-02-22 MED ORDER — CEPHALEXIN 250 MG PO CAPS: 500.0000 mg | ORAL_CAPSULE | Freq: Three times a day (TID) | ORAL | 0 refills | Status: DC

## 2018-02-22 MED ORDER — SODIUM CHLORIDE 0.9 % IV SOLN
1.0000 g | Freq: Once | INTRAVENOUS | Status: AC
  Administered 2018-02-22: 1 g via INTRAVENOUS
  Filled 2018-02-22: qty 10

## 2018-02-22 MED ORDER — SODIUM CHLORIDE 0.9 % IV BOLUS
500.0000 mL | Freq: Once | INTRAVENOUS | Status: AC
  Administered 2018-02-22: 500 mL via INTRAVENOUS

## 2018-02-22 MED ORDER — TETANUS-DIPHTH-ACELL PERTUSSIS 5-2.5-18.5 LF-MCG/0.5 IM SUSP
0.5000 mL | Freq: Once | INTRAMUSCULAR | Status: AC
  Administered 2018-02-22: 0.5 mL via INTRAMUSCULAR
  Filled 2018-02-22: qty 0.5

## 2018-02-22 NOTE — Discharge Instructions (Signed)
See your primary care doctor in 1 week.

## 2018-02-22 NOTE — ED Provider Notes (Signed)
[facility] EMERGENCY DEPARTMENT Provider Note   CSN: [id number] Arrival date & time: 02/22/18  0031     History   Chief Complaint Chief Complaint  Patient presents with  . Fall    HPI Catherine West is a 84 y.o. female.  HPI 84 year old female comes in with chief complaint of fall. Patient has history of hypertension.  According to the patient she had a fall in her porch while trying to enter her house.  Patient does not know why she fell down, but she is absolutely sure that she did not faint as she remembers hitting her head.  Patient was unable to get up after the fall because of weakness.   Patient had left her daughter's house sometime ago, and the daughter did not receive the call back as she normally does therefore she went to check on her mother and found her on the porch.  Patient denies any pain including headache.  She is bleeding in the back of her head.  She denies any numbness, chest pain, shortness of breath, focal weakness, back pain, abdominal pain.  Patient is not on any blood thinners.   Past Medical History:  Diagnosis Date  . HTN (hypertension)     Patient Active Problem List   Diagnosis Date Noted  . COLLES' FRACTURE, RIGHT 04/15/2009    History reviewed. No pertinent surgical history.   OB History   None      Home Medications    Prior to Admission medications   Medication Sig Start Date End Date Taking? Authorizing Provider  BYSTOLIC 10 MG tablet Take 10 mg by mouth daily. 10/03/13   [provider]  lisinopril (PRINIVIL,ZESTRIL) 10 MG tablet Take 1 tablet (10 mg total) by mouth daily. 02/23/15   Eber Hong, MD  sertraline (ZOLOFT) 50 MG tablet Take 25 mg by mouth every other day.  07/28/13   [provider]    Family History No family history on file.  Social History Social History   Tobacco Use  . Smoking status: Never Smoker  . Smokeless tobacco: Never Used  Substance Use Topics  . Alcohol use: No  .  Drug use: No     Allergies   Aspirin   Review of Systems Review of Systems  Constitutional: Negative for activity change.  Respiratory: Negative for shortness of breath.   Cardiovascular: Negative for chest pain.  Gastrointestinal: Negative for abdominal pain.  Genitourinary: Negative for dysuria and flank pain.  Musculoskeletal: Negative for arthralgias, back pain and myalgias.  Skin: Positive for wound.  Hematological: Does not bruise/bleed easily.     Physical Exam Updated Vital Signs BP (!) 170/92   Pulse 64   Temp 98.5 F (36.9 C) (Oral)   Resp 16   Ht 5\' 5"  (1.651 m)   Wt 64.4 kg   SpO2 93%   BMI 23.63 kg/m   Physical Exam  Constitutional: She is oriented to person, place, and time. She appears well-developed.  HENT:  Head: Normocephalic and atraumatic.  Patient has a large hematoma and bleeding from her occiput region.  There is significant clotting around the back of her head  Eyes: EOM are normal.  Neck: Neck supple.  Cardiovascular: Normal rate.  Pulmonary/Chest: Effort normal and breath sounds normal.  Abdominal: Soft. Bowel sounds are normal. There is no tenderness.  Musculoskeletal: She exhibits no edema, tenderness or deformity.  Small abrasion over the left elbow and dorsal hand  Neurological: She is alert and oriented to person,  place, and time.  Skin: Skin is warm and dry.  Nursing note and vitals reviewed.    ED Treatments / Results  Labs (all labs ordered are listed, but only abnormal results are displayed) Labs Reviewed  CBC WITH DIFFERENTIAL/PLATELET - Abnormal; Notable for the following components:      Result Value   WBC 16.5 (*)    MCV 102.3 (*)    Neutro Abs 13.7 (*)    Abs Immature Granulocytes 0.09 (*)    All other components within normal limits  COMPREHENSIVE METABOLIC PANEL - Abnormal; Notable for the following components:   Potassium 3.3 (*)    Glucose, Bld 130 (*)    BUN 26 (*)    Creatinine, Ser 1.35 (*)    GFR calc  non Af Amer 32 (*)    GFR calc Af Amer 37 (*)    All other components within normal limits  URINALYSIS, ROUTINE W REFLEX MICROSCOPIC - Abnormal; Notable for the following components:   Color, Urine BROWN (*)    APPearance CLOUDY (*)    Hgb urine dipstick LARGE (*)    Bilirubin Urine MODERATE (*)    Ketones, ur TRACE (*)    Protein, ur >300 (*)    Nitrite POSITIVE (*)    Leukocytes, UA MODERATE (*)    All other components within normal limits  URINALYSIS, MICROSCOPIC (REFLEX) - Abnormal; Notable for the following components:   Bacteria, UA MANY (*)    All other components within normal limits  PROTIME-INR  CK    EKG EKG Interpretation  Date/Time:  Wednesday February 22 2018 02:11:41 EDT Ventricular Rate:  61 PR Interval:    QRS Duration: 154 QT Interval:  503 QTC Calculation: 507 R Axis:   -22 Text Interpretation:  Sinus rhythm Prolonged PR interval Left bundle branch block No significant change since last tracing Confirmed by Derwood Kaplan ([id number]) on 02/22/2018 4:30:47 AM   Radiology Dg Chest 2 View  Result Date: 02/22/2018 CLINICAL DATA:  Found down. EXAM: CHEST - 2 VIEW COMPARISON:  10/14/2013 FINDINGS: Mild cardiomegaly. No confluent airspace opacities, effusions or edema. No acute bony abnormality. IMPRESSION: Cardiomegaly.  No active disease. Electronically Signed   By: Charlett Nose M.D.   On: 02/22/2018 02:42   Ct Head Wo Contrast  Result Date: 02/22/2018 CLINICAL DATA:  Fall, hematoma to back of head. EXAM: CT HEAD WITHOUT CONTRAST CT CERVICAL SPINE WITHOUT CONTRAST TECHNIQUE: Multidetector CT imaging of the head and cervical spine was performed following the standard protocol without intravenous contrast. Multiplanar CT image reconstructions of the cervical spine were also generated. COMPARISON:  07/04/2008 FINDINGS: CT HEAD FINDINGS Brain: There is atrophy and chronic small vessel disease changes. No acute intracranial abnormality. Specifically, no hemorrhage,  hydrocephalus, mass lesion, acute infarction, or significant intracranial injury. Vascular: No hyperdense vessel or unexpected calcification. Skull: No acute calvarial abnormality. Sinuses/Orbits: Visualized paranasal sinuses and mastoids clear. Orbital soft tissues unremarkable. Other: Large posterior right scalp hematoma. CT CERVICAL SPINE FINDINGS Alignment: No subluxation Skull base and vertebrae: No acute fracture. No primary bone lesion or focal pathologic process. Soft tissues and spinal canal: No prevertebral fluid or swelling. No visible canal hematoma. Disc levels: Degenerative disc disease changes with disc space narrowing and spurring at C5-6. Mild bilateral degenerative facet disease. Upper chest: No acute findings Other: Carotid artery calcifications. IMPRESSION: No acute intracranial abnormality. Atrophy, chronic microvascular disease. Right posterior scalp hematoma. Degenerative changes in the cervical spine. No acute bony abnormality. Electronically Signed   By:  Charlett Nose M.D.   On: 02/22/2018 02:38   Ct Cervical Spine Wo Contrast  Result Date: 02/22/2018 CLINICAL DATA:  Fall, hematoma to back of head. EXAM: CT HEAD WITHOUT CONTRAST CT CERVICAL SPINE WITHOUT CONTRAST TECHNIQUE: Multidetector CT imaging of the head and cervical spine was performed following the standard protocol without intravenous contrast. Multiplanar CT image reconstructions of the cervical spine were also generated. COMPARISON:  07/04/2008 FINDINGS: CT HEAD FINDINGS Brain: There is atrophy and chronic small vessel disease changes. No acute intracranial abnormality. Specifically, no hemorrhage, hydrocephalus, mass lesion, acute infarction, or significant intracranial injury. Vascular: No hyperdense vessel or unexpected calcification. Skull: No acute calvarial abnormality. Sinuses/Orbits: Visualized paranasal sinuses and mastoids clear. Orbital soft tissues unremarkable. Other: Large posterior right scalp hematoma. CT  CERVICAL SPINE FINDINGS Alignment: No subluxation Skull base and vertebrae: No acute fracture. No primary bone lesion or focal pathologic process. Soft tissues and spinal canal: No prevertebral fluid or swelling. No visible canal hematoma. Disc levels: Degenerative disc disease changes with disc space narrowing and spurring at C5-6. Mild bilateral degenerative facet disease. Upper chest: No acute findings Other: Carotid artery calcifications. IMPRESSION: No acute intracranial abnormality. Atrophy, chronic microvascular disease. Right posterior scalp hematoma. Degenerative changes in the cervical spine. No acute bony abnormality. Electronically Signed   By: Charlett Nose M.D.   On: 02/22/2018 02:38    Procedures .Marland KitchenIncision and Drainage Date/Time: 02/22/2018 3:52 AM Performed by: Derwood Kaplan, MD Authorized by: Derwood Kaplan, MD   Consent:    Consent obtained:  Verbal   Consent given by:  Patient   Risks discussed:  Bleeding and incomplete drainage Universal protocol:    Procedure explained and questions answered to patient or proxy's satisfaction: yes   Location:    Type:  Hematoma   Size:  4 CM   Location:  Head   Head location:  Scalp Procedure type:    Complexity:  Complex Procedure details:    Needle aspiration: yes     Needle size:  18 G   Incision depth:  Dermal   Wound management:  Irrigated with saline and extensive cleaning   Drainage:  Bloody   Drainage amount:  Moderate   Packing materials:  None Post-procedure details:    Patient tolerance of procedure:  Tolerated well, no immediate complications   (including critical care time)  Medications Ordered in ED Medications  cefTRIAXone (ROCEPHIN) 1 g in sodium chloride 0.9 % 100 mL IVPB (1 g Intravenous New Bag/Given 02/22/18 0413)  Tdap (BOOSTRIX) injection 0.5 mL (0.5 mLs Intramuscular Given 02/22/18 0208)  sodium chloride 0.9 % bolus 500 mL (0 mLs Intravenous Stopped 02/22/18 0340)     Initial Impression /  Assessment and Plan / ED Course  I have reviewed the triage vital signs and the nursing notes.  Pertinent labs & imaging results that were available during my care of the patient were reviewed by me and considered in my medical decision making (see chart for details).  Clinical Course as of Feb 22 429  Wed Feb 22, 2018  0152 Nursing team alerted me on the urine findings.  They noted that patient had a prolapsed bladder and her urine was bloody with some tissue in it.  I went and assessed the patient.  She indeed has both prolapsed uterus and rectum.  Patient states that " it has always been like this".  She does not have any pain in that area.  I asked her if she was aware that her urine  was bloody and patient denied.  She still does not have any UTI-like symptoms.  Repeat abdominal exam completed and is still nontender.  There is no ecchymosis over her abdomen and the abdomen is soft.  Patient does not want anything done for the prolapsed organs, and states that she has not even mentioned it to her primary care doctor.  We informed her that 1 of her concerns was that when she fell that could have led to elevated intra-peritoneal pressure that might have prolapse or bladder -but patient does not think that happened. We will check UA. We will now get basic labs. Vital signs do not show tachycardia and her abdomen does not have any peritoneal signs.  Since patient does not want anything done about the prolapse, I do not see any reason to get a CT scan in light of her not having any tenderness.  If patient's hemoglobin is significantly low, then we will get CT blunt trauma protocol ordered.  We will also get CT head and C-spine.  Nursing team to clean up the wound.   [AN]  0351 No internal bleeding seen.  CT Head Wo Contrast [AN]  0351 Patient has a nitrite positive urine with leukocytes, many bacteria and hematuria.  She might be having cystitis that led to the gross hematuria. We will give her  antibiotics.  Nitrite(!): POSITIVE [AN]    Clinical Course User Index [AN] Derwood Kaplan, MD    84 year old female comes in with chief complaint of fall. She has no complaints.  She thinks she had a mechanical fall, and is sure that she did not faint.  Patient remembers striking her head onto the porch.  It appears to me that patient might be downplaying her symptoms.  It is possible that she might have fainted and that is why she fell in first place.  Other possibilities that she has concussion and has some retrograde amnesia.  She has no cardiovascular history and denies any recent dizziness, palpitations, chest pain, shortness of breath.  We will place her on cardiac monitor and get an EKG.   CT head and C-spine have been ordered to ensure there is no critical injury in those areas.  Patient's wound will be cleaned up and then repaired.  Patient has some skin abrasions, otherwise she has no deformity and her range of motion is normal over upper extremity and lower extremities.  There is no focal tenderness either.  Abdomen is soft and there is no ecchymosis over the torso.  Final Clinical Impressions(s) / ED Diagnoses   Final diagnoses:  Acute cystitis with hematuria  Hematoma  Fall, initial encounter    ED Discharge Orders    None       Derwood Kaplan, MD 02/22/18 [phone]

## 2018-02-22 NOTE — ED Triage Notes (Addendum)
Pt states she left her daughters house around 2130 tonight. Family was unable to get in touch with her by phone. Family went to pts house and found pt on the ground around 0000. Pt has hematoma to the back of the head. Pt C/O pain to the back of the head. Pt unsure of why/how she fell.

## 2018-02-22 NOTE — ED Notes (Signed)
Patient transported to CT 

## 2018-03-16 DIAGNOSIS — Z0000 Encounter for general adult medical examination without abnormal findings: Secondary | ICD-10-CM | POA: Diagnosis not present

## 2018-03-16 DIAGNOSIS — F321 Major depressive disorder, single episode, moderate: Secondary | ICD-10-CM | POA: Diagnosis not present

## 2018-03-16 DIAGNOSIS — I10 Essential (primary) hypertension: Secondary | ICD-10-CM | POA: Diagnosis not present

## 2018-03-20 DIAGNOSIS — N390 Urinary tract infection, site not specified: Secondary | ICD-10-CM | POA: Diagnosis not present

## 2018-04-17 DIAGNOSIS — N390 Urinary tract infection, site not specified: Secondary | ICD-10-CM | POA: Diagnosis not present

## 2018-06-22 DIAGNOSIS — N390 Urinary tract infection, site not specified: Secondary | ICD-10-CM | POA: Diagnosis not present

## 2018-06-22 DIAGNOSIS — I129 Hypertensive chronic kidney disease with stage 1 through stage 4 chronic kidney disease, or unspecified chronic kidney disease: Secondary | ICD-10-CM | POA: Diagnosis not present

## 2018-06-22 DIAGNOSIS — F321 Major depressive disorder, single episode, moderate: Secondary | ICD-10-CM | POA: Diagnosis not present

## 2018-06-22 DIAGNOSIS — E785 Hyperlipidemia, unspecified: Secondary | ICD-10-CM | POA: Diagnosis not present

## 2018-09-20 DIAGNOSIS — Z0000 Encounter for general adult medical examination without abnormal findings: Secondary | ICD-10-CM | POA: Diagnosis not present

## 2018-12-21 DIAGNOSIS — N182 Chronic kidney disease, stage 2 (mild): Secondary | ICD-10-CM | POA: Diagnosis not present

## 2018-12-21 DIAGNOSIS — I129 Hypertensive chronic kidney disease with stage 1 through stage 4 chronic kidney disease, or unspecified chronic kidney disease: Secondary | ICD-10-CM | POA: Diagnosis not present

## 2018-12-21 DIAGNOSIS — E039 Hypothyroidism, unspecified: Secondary | ICD-10-CM | POA: Diagnosis not present

## 2018-12-21 DIAGNOSIS — E785 Hyperlipidemia, unspecified: Secondary | ICD-10-CM | POA: Diagnosis not present

## 2019-01-09 DIAGNOSIS — I129 Hypertensive chronic kidney disease with stage 1 through stage 4 chronic kidney disease, or unspecified chronic kidney disease: Secondary | ICD-10-CM | POA: Diagnosis not present

## 2019-01-09 DIAGNOSIS — Z23 Encounter for immunization: Secondary | ICD-10-CM | POA: Diagnosis not present

## 2019-01-09 DIAGNOSIS — E785 Hyperlipidemia, unspecified: Secondary | ICD-10-CM | POA: Diagnosis not present

## 2019-01-09 DIAGNOSIS — E039 Hypothyroidism, unspecified: Secondary | ICD-10-CM | POA: Diagnosis not present

## 2019-01-10 DIAGNOSIS — E039 Hypothyroidism, unspecified: Secondary | ICD-10-CM | POA: Diagnosis not present

## 2019-01-10 DIAGNOSIS — I129 Hypertensive chronic kidney disease with stage 1 through stage 4 chronic kidney disease, or unspecified chronic kidney disease: Secondary | ICD-10-CM | POA: Diagnosis not present

## 2019-01-10 DIAGNOSIS — E785 Hyperlipidemia, unspecified: Secondary | ICD-10-CM | POA: Diagnosis not present

## 2019-01-10 DIAGNOSIS — N182 Chronic kidney disease, stage 2 (mild): Secondary | ICD-10-CM | POA: Diagnosis not present

## 2019-01-10 LAB — COMPREHENSIVE METABOLIC PANEL
Albumin: 3.9 (ref 3.5–5.0)
Calcium: 8.9 (ref 8.7–10.7)
GFR calc Af Amer: 32
GFR calc non Af Amer: 28
Globulin: 3.1

## 2019-01-10 LAB — BASIC METABOLIC PANEL
BUN: 26 — AB (ref 4–21)
CO2: 31 — AB (ref 13–22)
Chloride: 104 (ref 99–108)
Creatinine: 1.6 — AB (ref <=1.1)
Glucose: 102
Potassium: 3.6 (ref 3.4–5.3)
Sodium: 142 (ref 137–147)

## 2019-01-10 LAB — CBC AND DIFFERENTIAL
HCT: 42 (ref 36–46)
Hemoglobin: 14.1 (ref 12.0–16.0)
Platelets: 183 (ref 150–399)
WBC: 7.7

## 2019-01-10 LAB — HEPATIC FUNCTION PANEL
ALT: 10 (ref 7–35)
AST: 20 (ref 13–35)
Alkaline Phosphatase: 66 (ref 25–125)

## 2019-01-10 LAB — TSH: TSH: 3.69 (ref <=5.90)

## 2019-01-10 LAB — CBC: RBC: 4.34 (ref 3.87–5.11)

## 2019-01-11 LAB — LIPID PANEL
Cholesterol: 216 — AB (ref 0–200)
HDL: 50 (ref 35–70)
LDL Cholesterol: 137
Triglycerides: 156 (ref 40–160)

## 2019-02-19 NOTE — Progress Notes (Signed)
[facility]    Provider:  Dr Lucia Gaskins Requesting Provider: Kari Baars, MD Primary Care Provider:  Kari Baars, MD  CC:  Multiple falls  HPI:  Catherine West is a 85 y.o. female here as requested by Kari Baars, MD for multiple falls.  Past medical history essential hypertension, hyperlipidemia, major depressive disorder, chronic kidney disease, cough, hypertensive chronic kidney disease, hypothyroidism.  Also with osteoporosis, history of pneumonia, acute bronchitis and urinary tract infection.  I reviewed Dr. Juanetta Gosling notes, patient was last seen January 09, 2019 because she fell, she just lost her balance and fell backwards, she hit her head and did not go to the emergency room, she is had multiple falls, using a cane.  No neurologic symptoms, I did not feel like she was dizzy but she was mildly anxious.  Examination was unremarkable except for hematoma in the back of her scalp, no focal neurologic abnormalities.  She refused blood testing per Dr. Juanetta Gosling notes, she does have trouble with anxiety and depression.  Patient has a prolapsed uterus and rectum which puts her at risk for urinary tract infections in fact last year when she fell in the emergency room in October 2019 it showed significant blood, protein, white blood cells and nitrite in her urine.  She is here with her daughter who also provides much information. Patient has had some falls this year but doing better. She had a fall last year, she went out to the car and she fell backwards in the setting of a significant UTI. She was not using her cane and she just fell. She feels better with a 4-prong cane and she doesn't have to go up and down steps. She fell again and hit her head in her kitchen, she went in the kitchen too fast and fell, not too long ago. She feels she loses her balance. One time daughter saw her feet going backwards and she stopped and then again she did it 3 times trying to catch her  balance. She denies any back pain, neck pain, knee pain, no pain whatsoever, no dizziness, no lightheadedness, no vertigo, no vision changes, no weakness.   Reviewed notes, labs and imaging from outside physicians, which showed: I reviewed Dr. Juanetta Gosling notes see HPI above.  I reviewed CT of the head report in October 2019 which showed atrophy and chronic microvascular disease.  Images are not available.  I reviewed emergency rooms from October 2019 where she presented with a fall while trying to enter her house.  At that time patient did not know why she fell but she states she did not faint and she remembered hitting her head.  She was unable to get up after the fall because of weakness.  She was bleeding at the back of the head at the time.  No alcohol or drug use.  Patient's white blood cells were elevated, as well as MCV, her potassium was 3.3 which is low, glucose was elevated, BUN and creatinine both elevated and urine showed brown color, cloudy with large hemoglobin and lots of protein and leukocytes obviously urinary tract infection.  She was treated with Rocephin and fluids.  Urine was also bloody.  She has a prolapsed uterus and rectum.  Review of Systems: Patient complains of symptoms per HPI as well as the following symptoms falls, imbalance. Pertinent negatives and positives per HPI. All others negative.   Social History   Socioeconomic History  . Marital status: Widowed    Spouse name: Not on  file  . Number of children: 3  . Years of education: 107  . Highest education level: High school graduate  Occupational History  . Not on file  Social Needs  . Financial resource strain: Not on file  . Food insecurity    Worry: Not on file    Inability: Not on file  . Transportation needs    Medical: Not on file    Non-medical: Not on file  Tobacco Use  . Smoking status: Never Smoker  . Smokeless tobacco: Never Used  Substance and Sexual Activity  . Alcohol use: No  . Drug use: No   . Sexual activity: Not on file  Lifestyle  . Physical activity    Days per week: Not on file    Minutes per session: Not on file  . Stress: Not on file  Relationships  . Social Herbalist on phone: Not on file    Gets together: Not on file    Attends religious service: Not on file    Active member of club or organization: Not on file    Attends meetings of clubs or organizations: Not on file    Relationship status: Not on file  . Intimate partner violence    Fear of current or ex partner: Not on file    Emotionally abused: Not on file    Physically abused: Not on file    Forced sexual activity: Not on file  Other Topics Concern  . Not on file  Social History Narrative   Lives alone, daughter & son provide support and transportation    Family History  Problem Relation Age of Onset  . Other Father        brain tumor, inoperable  . Emphysema Sister        smoker  . Stroke Neg Hx   . Parkinson's disease Neg Hx   . Dementia Neg Hx     Past Medical History:  Diagnosis Date  . Acute bronchitis   . HTN (hypertension)   . Osteoporosis   . Pneumonia   . Urinary tract infection     Patient Active Problem List   Diagnosis Date Noted  . COLLES' FRACTURE, RIGHT 04/15/2009    History reviewed. No pertinent surgical history.  Current Outpatient Medications  Medication Sig Dispense Refill  . hydrochlorothiazide (MICROZIDE) 12.5 MG capsule Take 1 capsule by mouth daily.    Marland Kitchen losartan (COZAAR) 100 MG tablet Take 1 tablet by mouth daily.    . metoprolol succinate (TOPROL-XL) 100 MG 24 hr tablet Take 1 tablet by mouth daily.    . sertraline (ZOLOFT) 100 MG tablet Take 100 mg by mouth daily.    Marland Kitchen trimethoprim (TRIMPEX) 100 MG tablet Take 100 mg by mouth daily.     No current facility-administered medications for this visit.     Allergies as of 02/20/2019 - Review Complete 02/20/2019  Allergen Reaction Noted  . Aspirin Palpitations 10/14/2013    Vitals: BP (!)  144/74 (BP Location: Left Arm, Patient Position: Sitting)   Pulse (!) 48   Temp 98.2 F (36.8 C) Comment: daughter 43; both taken at front door  Ht 5\' 2"  (1.575 m)   Wt 139 lb (63 kg)   BMI 25.42 kg/m  Last Weight:  Wt Readings from Last 1 Encounters:  02/20/19 139 lb (63 kg)   Last Height:   Ht Readings from Last 1 Encounters:  02/20/19 5\' 2"  (1.575 m)     Physical exam:  Exam: Gen: NAD, conversant, well nourised, thin, well groomed                     CV: RRR, no MRG. No Carotid Bruits. No peripheral edema, warm, nontender Eyes: Conjunctivae clear without exudates or hemorrhage  Neuro: Detailed Neurologic Exam  Speech:    Speech is normal; fluent and spontaneous with normal comprehension.  Cognition:    The patient is oriented to person, place, and time;     recent and remote memory intact;     language fluent;     normal attention, concentration,     fund of knowledge Cranial Nerves:    The pupils are equal, round, and reactive to light. Attempted fundoscopic exam could not visualize due to small pupils. Visual fields are full to finger confrontation. Extraocular movements are intact. Trigeminal sensation is intact and the muscles of mastication are normal. The face is symmetric. The palate elevates in the midline. Hearing intact. Voice is normal. Shoulder shrug is normal. The tongue has normal motion without fasciculations.   Coordination:    No dysmetria   Gait:    Slightly wide based with imbalance, multiple step turn, short strides however not shuffling or overtly parkinsonian   Motor Observation:    No asymmetry, no atrophy, and no involuntary movements noted. Tone:    Normal muscle tone.    Posture:    Posture is normal. normal erect    Strength: Hip flexion 4/5 bilaterally otherwise strength is intact in the upper and lower limbs.      Sensation: intact to LT     Reflex Exam:  DTR's:    Deep tendon reflexes in the upper and lower extremities are  slightly brisk in the uppers, normal in the lowers and symmetric bilaterally.   Toes:    The toes are downgoing bilaterally.   Clonus:    Clonus is absent.    Assessment/Plan: Amazing  85 y.o. female who looks decades less than her stated age and is here as requested by Kari BaarsHawkins, Edward, MD for multiple falls.  Past medical history essential hypertension, hyperlipidemia, major depressive disorder, chronic kidney disease, cough, hypertensive chronic kidney disease, hypothyroidism.  Also with osteoporosis, history of pneumonia, acute bronchitis and urinary tract infection.   . She looks amazing, she denies any joint pain, may be some gait apraxia due to brain atrophy and chronic white matter changes but these changes are not significant for her age. She denies any back pain, neck pain, knee pain, no pain whatsoever, no dizziness, no lightheadedness,  no vertigo, no vision changes, no weakness, no sensory changes.    Slightly wide based with imbalance, multiple step turn, short strides however not shuffling or overtly parkinsonian. Falls may be  due to aging and not practicing balance,some lower proximal weakness, possibly gait apraxia due to brain atrophy.   - She has not been to PT, will order home PT and safety inspection. If not improved with PT can consider MRI brain,c-spine or even l-spine, emg/ncs she has some slightly brisk reflexes in the uppers and slight prox LE weakness. She is slightly wide based and ataxic, I would image the brain and c-spine first if we were to start anywhere.  - At this point would try conservative measures, try to ensure patient is using walking aid, home safety inspection, physical therapy. She is slightly wide based and ataxic, I would image the brain and c-spine first if we were to start anywhere.  Orders Placed  This Encounter  Procedures  . Ambulatory referral to Home Health   No orders of the defined types were placed in this encounter.   Cc: Kari Baars,  MD   A total of 60 minutes was spent face-to-face with this patient. Over half this time was spent on counseling patient on the  1. Gait abnormality   2. Falls frequently    diagnosis and different diagnostic and therapeutic options, counseling and coordination of care, risks ans benefits of management, compliance, or risk factor reduction and education.     Naomie Dean, MD  [facility] [address] [place], [place] [address]  Phone [phone] Fax [phone]

## 2019-02-20 ENCOUNTER — Other Ambulatory Visit: Payer: Self-pay

## 2019-02-20 ENCOUNTER — Encounter: Payer: Self-pay | Admitting: Neurology

## 2019-02-20 ENCOUNTER — Ambulatory Visit: Payer: Medicare Other | Admitting: Neurology

## 2019-02-20 VITALS — BP 144/74 | HR 48 | Temp 98.2°F | Ht 62.0 in | Wt 139.0 lb

## 2019-02-20 DIAGNOSIS — R296 Repeated falls: Secondary | ICD-10-CM | POA: Diagnosis not present

## 2019-02-20 DIAGNOSIS — R269 Unspecified abnormalities of gait and mobility: Secondary | ICD-10-CM | POA: Diagnosis not present

## 2019-02-20 NOTE — Patient Instructions (Addendum)
Physical Therapy Please call after physical therapy if you want to come back and get more testing  Fall Prevention in the Home, Adult Falls can cause injuries. They can happen to people of all ages. There are many things you can do to make your home safe and to help prevent falls. Ask for help when making these changes, if needed. What actions can I take to prevent falls? General Instructions  Use good lighting in all rooms. Replace any light bulbs that burn out.  Turn on the lights when you go into a dark area. Use night-lights.  Keep items that you use often in easy-to-reach places. Lower the shelves around your home if necessary.  Set up your furniture so you have a clear path. Avoid moving your furniture around.  Do not have throw rugs and other things on the floor that can make you trip.  Avoid walking on wet floors.  If any of your floors are uneven, fix them.  Add color or contrast paint or tape to clearly mark and help you see: ? Any grab bars or handrails. ? First and last steps of stairways. ? Where the edge of each step is.  If you use a stepladder: ? Make sure that it is fully opened. Do not climb a closed stepladder. ? Make sure that both sides of the stepladder are locked into place. ? Ask someone to hold the stepladder for you while you use it.  If there are any pets around you, be aware of where they are. What can I do in the bathroom?      Keep the floor dry. Clean up any water that spills onto the floor as soon as it happens.  Remove soap buildup in the tub or shower regularly.  Use non-skid mats or decals on the floor of the tub or shower.  Attach bath mats securely with double-sided, non-slip rug tape.  If you need to sit down in the shower, use a plastic, non-slip stool.  Install grab bars by the toilet and in the tub and shower. Do not use towel bars as grab bars. What can I do in the bedroom?  Make sure that you have a light by your bed that  is easy to reach.  Do not use any sheets or blankets that are too big for your bed. They should not hang down onto the floor.  Have a firm chair that has side arms. You can use this for support while you get dressed. What can I do in the kitchen?  Clean up any spills right away.  If you need to reach something above you, use a strong step stool that has a grab bar.  Keep electrical cords out of the way.  Do not use floor polish or wax that makes floors slippery. If you must use wax, use non-skid floor wax. What can I do with my stairs?  Do not leave any items on the stairs.  Make sure that you have a light switch at the top of the stairs and the bottom of the stairs. If you do not have them, ask someone to add them for you.  Make sure that there are handrails on both sides of the stairs, and use them. Fix handrails that are broken or loose. Make sure that handrails are as long as the stairways.  Install non-slip stair treads on all stairs in your home.  Avoid having throw rugs at the top or bottom of the stairs. If you  do have throw rugs, attach them to the floor with carpet tape.  Choose a carpet that does not hide the edge of the steps on the stairway.  Check any carpeting to make sure that it is firmly attached to the stairs. Fix any carpet that is loose or worn. What can I do on the outside of my home?  Use bright outdoor lighting.  Regularly fix the edges of walkways and driveways and fix any cracks.  Remove anything that might make you trip as you walk through a door, such as a raised step or threshold.  Trim any bushes or trees on the path to your home.  Regularly check to see if handrails are loose or broken. Make sure that both sides of any steps have handrails.  Install guardrails along the edges of any raised decks and porches.  Clear walking paths of anything that might make someone trip, such as tools or rocks.  Have any leaves, snow, or ice cleared  regularly.  Use sand or salt on walking paths during winter.  Clean up any spills in your garage right away. This includes grease or oil spills. What other actions can I take?  Wear shoes that: ? Have a low heel. Do not wear high heels. ? Have rubber bottoms. ? Are comfortable and fit you well. ? Are closed at the toe. Do not wear open-toe sandals.  Use tools that help you move around (mobility aids) if they are needed. These include: ? Canes. ? Walkers. ? Scooters. ? Crutches.  Review your medicines with your doctor. Some medicines can make you feel dizzy. This can increase your chance of falling. Ask your doctor what other things you can do to help prevent falls. Where to find more information  Centers for Disease Control and Prevention, STEADI: https://garcia.biz/  [employer] on Aging: BrainJudge.co.uk Contact a doctor if:  You are afraid of falling at home.  You feel weak, drowsy, or dizzy at home.  You fall at home. Summary  There are many simple things that you can do to make your home safe and to help prevent falls.  Ways to make your home safe include removing tripping hazards and installing grab bars in the bathroom.  Ask for help when making these changes in your home. This information is not intended to replace advice given to you by your health care provider. Make sure you discuss any questions you have with your health care provider. Document Released: 02/13/2009 Document Revised: 08/10/2018 Document Reviewed: 12/02/2016 Elsevier Patient Education  2020 [name].

## 2019-02-22 DIAGNOSIS — R269 Unspecified abnormalities of gait and mobility: Secondary | ICD-10-CM | POA: Insufficient documentation

## 2019-02-22 DIAGNOSIS — R296 Repeated falls: Secondary | ICD-10-CM | POA: Insufficient documentation

## 2019-02-27 ENCOUNTER — Telehealth: Payer: Self-pay | Admitting: Neurology

## 2019-02-27 NOTE — Telephone Encounter (Signed)
Per [facility] patient is refusing to Do home Health.   Sprinkle, Gelene Mink, Hinton Dyer R        pt is refusing HH only want to do out patient . Just wanted to make you aware.  Thanks

## 2019-02-28 NOTE — Telephone Encounter (Signed)
Noted thanks °

## 2019-03-22 DIAGNOSIS — E039 Hypothyroidism, unspecified: Secondary | ICD-10-CM | POA: Diagnosis not present

## 2019-03-22 DIAGNOSIS — N189 Chronic kidney disease, unspecified: Secondary | ICD-10-CM | POA: Diagnosis not present

## 2019-03-22 DIAGNOSIS — E785 Hyperlipidemia, unspecified: Secondary | ICD-10-CM | POA: Diagnosis not present

## 2019-03-22 DIAGNOSIS — I129 Hypertensive chronic kidney disease with stage 1 through stage 4 chronic kidney disease, or unspecified chronic kidney disease: Secondary | ICD-10-CM | POA: Diagnosis not present

## 2019-04-06 DIAGNOSIS — R05 Cough: Secondary | ICD-10-CM

## 2019-04-06 DIAGNOSIS — R059 Cough, unspecified: Secondary | ICD-10-CM

## 2019-04-06 DIAGNOSIS — E039 Hypothyroidism, unspecified: Secondary | ICD-10-CM | POA: Insufficient documentation

## 2019-04-06 DIAGNOSIS — M810 Age-related osteoporosis without current pathological fracture: Secondary | ICD-10-CM

## 2019-04-06 DIAGNOSIS — E785 Hyperlipidemia, unspecified: Secondary | ICD-10-CM

## 2019-04-06 DIAGNOSIS — F329 Major depressive disorder, single episode, unspecified: Secondary | ICD-10-CM

## 2019-04-06 DIAGNOSIS — N189 Chronic kidney disease, unspecified: Secondary | ICD-10-CM

## 2019-04-06 DIAGNOSIS — I10 Essential (primary) hypertension: Secondary | ICD-10-CM

## 2019-04-06 DIAGNOSIS — N182 Chronic kidney disease, stage 2 (mild): Secondary | ICD-10-CM

## 2019-04-06 DIAGNOSIS — I129 Hypertensive chronic kidney disease with stage 1 through stage 4 chronic kidney disease, or unspecified chronic kidney disease: Secondary | ICD-10-CM

## 2019-06-28 ENCOUNTER — Other Ambulatory Visit: Payer: Self-pay

## 2019-06-28 ENCOUNTER — Ambulatory Visit
Admission: EM | Admit: 2019-06-28 | Discharge: 2019-06-28 | Disposition: A | Discharge status: Home / Self Care | Payer: Medicare Other | Attending: Emergency Medicine | Admitting: Emergency Medicine

## 2019-06-28 DIAGNOSIS — B029 Zoster without complications: Secondary | ICD-10-CM | POA: Diagnosis not present

## 2019-06-28 DIAGNOSIS — R21 Rash and other nonspecific skin eruption: Secondary | ICD-10-CM

## 2019-06-28 DIAGNOSIS — J986 Disorders of diaphragm: Secondary | ICD-10-CM | POA: Diagnosis not present

## 2019-06-28 DIAGNOSIS — W19XXXA Unspecified fall, initial encounter: Secondary | ICD-10-CM

## 2019-06-28 DIAGNOSIS — M40209 Unspecified kyphosis, site unspecified: Secondary | ICD-10-CM | POA: Diagnosis not present

## 2019-06-28 MED ORDER — PREDNISONE 10 MG (21) PO TBPK: ORAL_TABLET | Freq: Every day | ORAL | 0 refills | Status: DC

## 2019-06-28 MED ORDER — VALACYCLOVIR HCL 1 G PO TABS: 1000.0000 mg | ORAL_TABLET | Freq: Every day | ORAL | 0 refills | Status: AC

## 2019-06-28 NOTE — ED Provider Notes (Signed)
[place]   [id number] 06/28/19 Arrival Time: 1027  CC: Rash  SUBJECTIVE:  Catherine West is a 86 y.o. female who presents with a red itchy rash to RT torso x 1 week.  States she had a fall backwards on carpet at home and then noticed rash. Localizes the rash to RT torso.  Describes it as red and itchy.  Has tried OTC medication without relief.  Symptoms are made worse with scratching.  Denies similar symptoms in the past.   Denies fever, chills, nausea, vomiting, swelling, discharge, SOB, difficulty breathing, chest pain, abdominal pain, changes in bowel or bladder function.    ROS: As per HPI.  All other pertinent ROS negative.     Past Medical History:  Diagnosis Date  . Acute bronchitis   . Acute bronchitis   . Chronic kidney disease, stage 2 (mild)   . Chronic kidney disease, unspecified   . Cough   . Essential (primary) hypertension   . HTN (hypertension)   . Hyperlipidemia, unspecified   . Hypertensive chronic kidney disease with stage 1 through stage 4 chronic kidney disease, or unspecified chronic kidney disease   . Hypothyroidism, unspecified   . Major depressive disorder, single episode, unspecified   . Osteoporosis   . Osteoporosis   . Pneumonia   . Pneumonia   . Urinary tract infection   . Urinary tract infection    History reviewed. No pertinent surgical history. Allergies  Allergen Reactions  . Aspirin Palpitations    Pt has no knowledge of this    No current facility-administered medications on file prior to encounter.   Current Outpatient Medications on File Prior to Encounter  Medication Sig Dispense Refill  . hydrochlorothiazide (MICROZIDE) 12.5 MG capsule Take 1 capsule by mouth daily.    Marland Kitchen losartan (COZAAR) 100 MG tablet Take 1 tablet by mouth daily.    . metoprolol succinate (TOPROL-XL) 100 MG 24 hr tablet Take 1 tablet by mouth daily.    . sertraline (ZOLOFT) 100 MG tablet Take 100 mg by mouth daily.    Marland Kitchen trimethoprim (TRIMPEX) 100  MG tablet Take 100 mg by mouth daily.     Social History   Socioeconomic History  . Marital status: Widowed    Spouse name: Not on file  . Number of children: 3  . Years of education: 81  . Highest education level: High school graduate  Occupational History  . Not on file  Tobacco Use  . Smoking status: Never Smoker  . Smokeless tobacco: Never Used  Substance and Sexual Activity  . Alcohol use: No  . Drug use: No  . Sexual activity: Not on file  Other Topics Concern  . Not on file  Social History Narrative   Lives alone, daughter & son provide support and transportation   Social Determinants of Health   Financial Resource Strain:   . Difficulty of Paying Living Expenses: Not on file  Food Insecurity:   . Worried About Charity fundraiser in the Last Year: Not on file  . Ran Out of Food in the Last Year: Not on file  Transportation Needs:   . Lack of Transportation (Medical): Not on file  . Lack of Transportation (Non-Medical): Not on file  Physical Activity:   . Days of Exercise per Week: Not on file  . Minutes of Exercise per Session: Not on file  Stress:   . Feeling of Stress : Not on file  Social Connections:   . Frequency  of Communication with Friends and Family: Not on file  . Frequency of Social Gatherings with Friends and Family: Not on file  . Attends Religious Services: Not on file  . Active Member of Clubs or Organizations: Not on file  . Attends Banker Meetings: Not on file  . Marital Status: Not on file  Intimate Partner Violence:   . Fear of Current or Ex-Partner: Not on file  . Emotionally Abused: Not on file  . Physically Abused: Not on file  . Sexually Abused: Not on file   Family History  Problem Relation Age of Onset  . Other Father        brain tumor, inoperable  . Emphysema Sister        smoker  . Stroke Neg Hx   . Parkinson's disease Neg Hx   . Dementia Neg Hx     OBJECTIVE: Vitals:   06/28/19 1509  BP: (!) 144/72   Pulse: (!) 52  Resp: 18  Temp: 98.9 F (37.2 C)  SpO2: 93%    General appearance: alert; no distress Head: NCAT ENT: EOMI grossly Lungs: clear to auscultation bilaterally Heart: regular rate and rhythm.   Chest wall: NTTP with AP or lateral compression Extremities: no edema Skin: warm and dry; crops of erythema macules and papules over RT torso; some crusting, no obvious discharge or bleeding Psychological: alert and cooperative; normal mood and affect  ASSESSMENT & PLAN:  1. Herpes zoster without complication   2. Rash and nonspecific skin eruption   3. Fall, initial encounter     Meds ordered this encounter  Medications  . predniSONE (STERAPRED UNI-PAK 21 TAB) 10 MG (21) TBPK tablet    Sig: Take by mouth daily. Take 6 tabs by mouth daily  for 2 days, then 5 tabs for 2 days, then 4 tabs for 2 days, then 3 tabs for 2 days, 2 tabs for 2 days, then 1 tab by mouth daily for 2 days    Dispense:  42 tablet    Refill:  0    Order Specific Question:   Supervising Provider    Answer:   Eustace Moore [[id number]]  . valACYclovir (VALTREX) 1000 MG tablet    Sig: Take 1 tablet (1,000 mg total) by mouth daily for 10 days.    Dispense:  10 tablet    Refill:  0    Order Specific Question:   Supervising Provider    Answer:   Eustace Moore [[id number]]   Declines chest/ rib x-ray today.  Low suspicion for rib fracture given mechanism of fall and physical exam.   Rest and use ice/heat as needed for symptomatic relief Prescribed valacyclovir 1000mg  day for 10 days; dose adjusted given CKD Prescribed prednisone taper for inflammation and pain Use OTC medications such as ibuprofen/ tylenol.  Use tramadol as needed for break-through pain Follow up with PCP in 7-10 days if rash is still present Follow up with PCP if symptoms of burning, stinging, tingling or numbness occur after rash resolves, you may need additional treatment Return here or go to ER if you have any new or worsening  symptoms (such as eye involvement, severe pain, or signs of secondary infection such as fever, chills, nausea, vomiting, discharge, redness or warmth over site of rash)  Reviewed expectations re: course of current medical issues. Questions answered. Outlined signs and symptoms indicating need for more acute intervention. Patient verbalized understanding. After Visit Summary given.   9-10, PA-C 06/28/19 1550

## 2019-06-28 NOTE — Discharge Instructions (Signed)
Declines chest/ rib x-ray today.  Low suspicion for rib fracture given mechanism of fall and physical exam.   Rest and use ice/heat as needed for symptomatic relief Prescribed valacyclovir 1000mg  day for 10 days; dose adjusted given CKD Prescribed prednisone taper for inflammation and pain Use OTC medications such as ibuprofen/ tylenol.  Use tramadol as needed for break-through pain Follow up with PCP in 7-10 days if rash is still present Follow up with PCP if symptoms of burning, stinging, tingling or numbness occur after rash resolves, you may need additional treatment Return here or go to ER if you have any new or worsening symptoms (such as eye involvement, severe pain, or signs of secondary infection such as fever, chills, nausea, vomiting, discharge, redness or warmth over site of rash)

## 2019-06-28 NOTE — ED Triage Notes (Signed)
Pt states she fell on back over a week ago , pt denies pain but is concerned about bumps that have formed that itch

## 2019-08-30 ENCOUNTER — Ambulatory Visit: Payer: Medicare Other | Admitting: Family Medicine

## 2019-09-19 DIAGNOSIS — H524 Presbyopia: Secondary | ICD-10-CM | POA: Diagnosis not present

## 2019-10-23 DIAGNOSIS — F419 Anxiety disorder, unspecified: Secondary | ICD-10-CM | POA: Diagnosis not present

## 2019-10-23 DIAGNOSIS — I10 Essential (primary) hypertension: Secondary | ICD-10-CM | POA: Diagnosis not present

## 2019-10-23 DIAGNOSIS — R001 Bradycardia, unspecified: Secondary | ICD-10-CM | POA: Diagnosis not present

## 2019-10-23 DIAGNOSIS — Z0189 Encounter for other specified special examinations: Secondary | ICD-10-CM | POA: Diagnosis not present

## 2019-10-26 DIAGNOSIS — R001 Bradycardia, unspecified: Secondary | ICD-10-CM | POA: Diagnosis not present

## 2019-10-26 DIAGNOSIS — I10 Essential (primary) hypertension: Secondary | ICD-10-CM | POA: Diagnosis not present

## 2019-10-26 DIAGNOSIS — Z712 Person consulting for explanation of examination or test findings: Secondary | ICD-10-CM | POA: Diagnosis not present

## 2019-10-26 DIAGNOSIS — E569 Vitamin deficiency, unspecified: Secondary | ICD-10-CM | POA: Diagnosis not present

## 2019-10-30 DIAGNOSIS — M8580 Other specified disorders of bone density and structure, unspecified site: Secondary | ICD-10-CM | POA: Diagnosis not present

## 2019-10-30 DIAGNOSIS — I10 Essential (primary) hypertension: Secondary | ICD-10-CM | POA: Diagnosis not present

## 2019-10-30 DIAGNOSIS — F419 Anxiety disorder, unspecified: Secondary | ICD-10-CM | POA: Diagnosis not present

## 2019-10-30 DIAGNOSIS — R001 Bradycardia, unspecified: Secondary | ICD-10-CM | POA: Diagnosis not present

## 2019-11-04 ENCOUNTER — Other Ambulatory Visit: Payer: Self-pay

## 2019-11-04 ENCOUNTER — Emergency Department (HOSPITAL_COMMUNITY)
Admission: EM | Admit: 2019-11-04 | Discharge: 2019-11-04 | Disposition: A | Discharge status: Home / Self Care | Payer: Medicare Other | Attending: Emergency Medicine | Admitting: Emergency Medicine

## 2019-11-04 ENCOUNTER — Encounter (HOSPITAL_COMMUNITY): Payer: Self-pay | Admitting: Emergency Medicine

## 2019-11-04 ENCOUNTER — Emergency Department (HOSPITAL_COMMUNITY): Payer: Medicare Other

## 2019-11-04 DIAGNOSIS — W1839XA Other fall on same level, initial encounter: Secondary | ICD-10-CM | POA: Insufficient documentation

## 2019-11-04 DIAGNOSIS — S0990XA Unspecified injury of head, initial encounter: Secondary | ICD-10-CM | POA: Insufficient documentation

## 2019-11-04 DIAGNOSIS — Y9389 Activity, other specified: Secondary | ICD-10-CM | POA: Diagnosis not present

## 2019-11-04 DIAGNOSIS — Y92194 Driveway of other specified residential institution as the place of occurrence of the external cause: Secondary | ICD-10-CM | POA: Diagnosis not present

## 2019-11-04 DIAGNOSIS — W19XXXA Unspecified fall, initial encounter: Secondary | ICD-10-CM

## 2019-11-04 DIAGNOSIS — E039 Hypothyroidism, unspecified: Secondary | ICD-10-CM | POA: Insufficient documentation

## 2019-11-04 DIAGNOSIS — I129 Hypertensive chronic kidney disease with stage 1 through stage 4 chronic kidney disease, or unspecified chronic kidney disease: Secondary | ICD-10-CM | POA: Diagnosis not present

## 2019-11-04 DIAGNOSIS — R0902 Hypoxemia: Secondary | ICD-10-CM | POA: Diagnosis not present

## 2019-11-04 DIAGNOSIS — I10 Essential (primary) hypertension: Secondary | ICD-10-CM | POA: Diagnosis not present

## 2019-11-04 DIAGNOSIS — S0101XA Laceration without foreign body of scalp, initial encounter: Secondary | ICD-10-CM | POA: Diagnosis not present

## 2019-11-04 DIAGNOSIS — N182 Chronic kidney disease, stage 2 (mild): Secondary | ICD-10-CM | POA: Insufficient documentation

## 2019-11-04 DIAGNOSIS — Y999 Unspecified external cause status: Secondary | ICD-10-CM | POA: Diagnosis not present

## 2019-11-04 DIAGNOSIS — R58 Hemorrhage, not elsewhere classified: Secondary | ICD-10-CM | POA: Diagnosis not present

## 2019-11-04 NOTE — ED Triage Notes (Signed)
Pt tripped and fell getting out of the car. Pt with small laceration to back of head from fall. Bleeding controlled at this time.

## 2019-11-04 NOTE — ED Provider Notes (Signed)
[facility] EMERGENCY DEPARTMENT Provider Note   CSN: [id number] Arrival date & time: 11/04/19  1919     History Chief Complaint  Patient presents with  . Fall  . Laceration    Catherine West is a 86 y.o. female presenting with a mechanical fall backwards today, striking her head on the concrete.  The patient ports that she came back from a long car ride, got out of the car on her driveway, which is low, and felt like she was losing her balance and fell backwards.  Her daughter managed to partially catch her, the patient landed on her bottom, and then fell backwards and struck her head on the ground.  There is no loss of consciousness.  She is not on blood thinners.  Minimal headache.  No neck pain or back pain.  HPI     Past Medical History:  Diagnosis Date  . Acute bronchitis   . Acute bronchitis   . Chronic kidney disease, stage 2 (mild)   . Chronic kidney disease, unspecified   . Cough   . Essential (primary) hypertension   . HTN (hypertension)   . Hyperlipidemia, unspecified   . Hypertensive chronic kidney disease with stage 1 through stage 4 chronic kidney disease, or unspecified chronic kidney disease   . Hypothyroidism, unspecified   . Major depressive disorder, single episode, unspecified   . Osteoporosis   . Osteoporosis   . Pneumonia   . Pneumonia   . Urinary tract infection   . Urinary tract infection     Patient Active Problem List   Diagnosis Date Noted  . Essential (primary) hypertension   . Hyperlipidemia, unspecified   . Major depressive disorder, single episode, unspecified   . Chronic kidney disease, stage 2 (mild)   . Chronic kidney disease, unspecified   . Cough   . Hypertensive chronic kidney disease with stage 1 through stage 4 chronic kidney disease, or unspecified chronic kidney disease   . Hypothyroidism, unspecified   . Osteoporosis   . Gait abnormality 02/22/2019  . Falls frequently 02/22/2019  . COLLES' FRACTURE, RIGHT 04/15/2009     History reviewed. No pertinent surgical history.   OB History   No obstetric history on file.     Family History  Problem Relation Age of Onset  . Other Father        brain tumor, inoperable  . Emphysema Sister        smoker  . Stroke Neg Hx   . Parkinson's disease Neg Hx   . Dementia Neg Hx     Social History   Tobacco Use  . Smoking status: Never Smoker  . Smokeless tobacco: Never Used  Vaping Use  . Vaping Use: Never used  Substance Use Topics  . Alcohol use: No  . Drug use: No    Home Medications Prior to Admission medications   Medication Sig Start Date End Date Taking? Authorizing Provider  hydrochlorothiazide (MICROZIDE) 12.5 MG capsule Take 1 capsule by mouth daily. 01/15/19   [provider]  losartan (COZAAR) 100 MG tablet Take 1 tablet by mouth daily. 01/15/19   [provider]  metoprolol succinate (TOPROL-XL) 100 MG 24 hr tablet Take 1 tablet by mouth daily. 01/15/19   [provider]  predniSONE (STERAPRED UNI-PAK 21 TAB) 10 MG (21) TBPK tablet Take by mouth daily. Take 6 tabs by mouth daily  for 2 days, then 5 tabs for 2 days, then 4 tabs for 2 days, then 3 tabs  for 2 days, 2 tabs for 2 days, then 1 tab by mouth daily for 2 days 06/28/19   Alvino Chapel, [place], PA-C  sertraline (ZOLOFT) 100 MG tablet Take 100 mg by mouth daily.    [provider]  trimethoprim (TRIMPEX) 100 MG tablet Take 100 mg by mouth daily.    [provider]    Allergies    Aspirin  Review of Systems   Review of Systems  Constitutional: Negative for chills and fever.  HENT: Negative for ear pain and sore throat.   Eyes: Negative for photophobia and visual disturbance.  Respiratory: Negative for cough and shortness of breath.   Cardiovascular: Negative for chest pain and palpitations.  Gastrointestinal: Negative for abdominal pain and vomiting.  Genitourinary: Negative for dysuria and hematuria.  Musculoskeletal: Positive for  arthralgias. Negative for neck pain.  Skin: Positive for wound. Negative for rash.  Neurological: Positive for dizziness and headaches. Negative for syncope, speech difficulty and weakness.  Psychiatric/Behavioral: Negative for agitation and confusion.  All other systems reviewed and are negative.   Physical Exam Updated Vital Signs BP (!) 186/88 (BP Location: Left Arm)   Pulse 78   Temp 99.3 F (37.4 C) (Oral)   Resp 18   Ht 5\' 2"  (1.575 m)   Wt 64.9 kg   SpO2 97%   BMI 26.16 kg/m   Physical Exam Vitals and nursing note reviewed.  Constitutional:      General: She is not in acute distress.    Appearance: She is well-developed.  HENT:     Head: Normocephalic.      Comments: Abrasion with small hematoma to the occiput of the skull, no active bleed, no visible laceration.  Some maceration over the hematoma site without active bleeding. Eyes:     Conjunctiva/sclera: Conjunctivae normal.  Cardiovascular:     Rate and Rhythm: Normal rate and regular rhythm.     Pulses: Normal pulses.  Pulmonary:     Effort: Pulmonary effort is normal. No respiratory distress.     Breath sounds: Normal breath sounds.  Abdominal:     Palpations: Abdomen is soft.     Tenderness: There is no abdominal tenderness.  Musculoskeletal:        General: No swelling, tenderness or signs of injury.     Cervical back: Normal range of motion and neck supple. No rigidity or tenderness.  Skin:    General: Skin is warm and dry.  Neurological:     General: No focal deficit present.     Mental Status: She is alert and oriented to person, place, and time.  Psychiatric:        Mood and Affect: Mood normal.        Behavior: Behavior normal.     ED Results / Procedures / Treatments   Labs (all labs ordered are listed, but only abnormal results are displayed) Labs Reviewed - No data to display  EKG None  Radiology CT Head Wo Contrast  Result Date: 11/04/2019 CLINICAL DATA:  86 year old female with  head trauma. EXAM: CT HEAD WITHOUT CONTRAST TECHNIQUE: Contiguous axial images were obtained from the base of the skull through the vertex without intravenous contrast. COMPARISON:  Head CT dated 02/22/2018. FINDINGS: Brain: Mild age-related atrophy and moderate chronic microvascular ischemic changes. There is no acute intracranial hemorrhage. No mass effect or midline shift. No extra-axial fluid collection. Vascular: No hyperdense vessel or unexpected calcification. Skull: Normal. Negative for fracture or focal lesion. Sinuses/Orbits: No acute finding. Other: Right  parietal scalp laceration and small hematoma. IMPRESSION: 1. No acute intracranial hemorrhage. 2. Age-related atrophy and chronic microvascular ischemic changes. Electronically Signed   By: Elgie Collard M.D.   On: 11/04/2019 20:44    Procedures Procedures (including critical care time)  Medications Ordered in ED Medications - No data to display  ED Course  I have reviewed the triage vital signs and the nursing notes.  Pertinent labs & imaging results that were available during my care of the patient were reviewed by me and considered in my medical decision making (see chart for details).  86 yo female presenting with fall and head injury.  No LOC.  She has been describing to me vertigo symptoms for many months.  She gets dizzy with sudden head movements.  This likely triggered her fall today.  I have a much lower suspicion for ACS or stroke.  CTH without acute intracranial bleed or fracture.  No evidence of spinal tenderness or fracture on exam. Low clinical suspicion for trauma to remaining extremities.  Okay for discharge home with daughter. I advised that she use her walker at all times, move slowly, particularly when standing up, and to talk to her doctor about her ongoing vertigo balance issues.  Clinical Course as of Nov 05 1307  Sun Nov 04, 2019  2051 IMPRESSION: 1. No acute intracranial hemorrhage. 2. Age-related  atrophy and chronic microvascular ischemic changes   [MT]    Clinical Course User Index [MT] Maisie Hauser, Kermit Balo, MD    Final Clinical Impression(s) / ED Diagnoses Final diagnoses:  Injury of head, initial encounter  Fall, initial encounter    Rx / DC Orders ED Discharge Orders    None       Renaye Rakers Kermit Balo, MD 11/05/19 1309

## 2019-11-06 DIAGNOSIS — R600 Localized edema: Secondary | ICD-10-CM | POA: Diagnosis not present

## 2019-11-06 DIAGNOSIS — R42 Dizziness and giddiness: Secondary | ICD-10-CM | POA: Diagnosis not present

## 2019-11-06 DIAGNOSIS — I129 Hypertensive chronic kidney disease with stage 1 through stage 4 chronic kidney disease, or unspecified chronic kidney disease: Secondary | ICD-10-CM | POA: Diagnosis not present

## 2019-11-06 DIAGNOSIS — R001 Bradycardia, unspecified: Secondary | ICD-10-CM | POA: Diagnosis not present

## 2019-11-09 DIAGNOSIS — R42 Dizziness and giddiness: Secondary | ICD-10-CM | POA: Diagnosis not present

## 2019-11-09 DIAGNOSIS — Z602 Problems related to living alone: Secondary | ICD-10-CM | POA: Diagnosis not present

## 2019-11-09 DIAGNOSIS — R600 Localized edema: Secondary | ICD-10-CM | POA: Diagnosis not present

## 2019-11-09 DIAGNOSIS — Z9181 History of falling: Secondary | ICD-10-CM | POA: Diagnosis not present

## 2019-11-09 DIAGNOSIS — R269 Unspecified abnormalities of gait and mobility: Secondary | ICD-10-CM | POA: Diagnosis not present

## 2019-11-09 DIAGNOSIS — N1832 Chronic kidney disease, stage 3b: Secondary | ICD-10-CM | POA: Diagnosis not present

## 2019-11-09 DIAGNOSIS — I129 Hypertensive chronic kidney disease with stage 1 through stage 4 chronic kidney disease, or unspecified chronic kidney disease: Secondary | ICD-10-CM | POA: Diagnosis not present

## 2019-11-09 DIAGNOSIS — R001 Bradycardia, unspecified: Secondary | ICD-10-CM | POA: Diagnosis not present

## 2019-11-19 DIAGNOSIS — N1832 Chronic kidney disease, stage 3b: Secondary | ICD-10-CM | POA: Diagnosis not present

## 2019-11-19 DIAGNOSIS — I129 Hypertensive chronic kidney disease with stage 1 through stage 4 chronic kidney disease, or unspecified chronic kidney disease: Secondary | ICD-10-CM | POA: Diagnosis not present

## 2019-11-19 DIAGNOSIS — R42 Dizziness and giddiness: Secondary | ICD-10-CM | POA: Diagnosis not present

## 2019-11-19 DIAGNOSIS — R600 Localized edema: Secondary | ICD-10-CM | POA: Diagnosis not present

## 2020-02-08 DIAGNOSIS — R42 Dizziness and giddiness: Secondary | ICD-10-CM | POA: Diagnosis not present

## 2020-02-08 DIAGNOSIS — Z712 Person consulting for explanation of examination or test findings: Secondary | ICD-10-CM | POA: Diagnosis not present

## 2020-02-08 DIAGNOSIS — I129 Hypertensive chronic kidney disease with stage 1 through stage 4 chronic kidney disease, or unspecified chronic kidney disease: Secondary | ICD-10-CM | POA: Diagnosis not present

## 2020-02-08 DIAGNOSIS — N1832 Chronic kidney disease, stage 3b: Secondary | ICD-10-CM | POA: Diagnosis not present

## 2020-02-12 DIAGNOSIS — Z0001 Encounter for general adult medical examination with abnormal findings: Secondary | ICD-10-CM | POA: Diagnosis not present

## 2020-02-12 DIAGNOSIS — F419 Anxiety disorder, unspecified: Secondary | ICD-10-CM | POA: Diagnosis not present

## 2020-02-12 DIAGNOSIS — M8580 Other specified disorders of bone density and structure, unspecified site: Secondary | ICD-10-CM | POA: Diagnosis not present

## 2020-02-12 DIAGNOSIS — E559 Vitamin D deficiency, unspecified: Secondary | ICD-10-CM | POA: Diagnosis not present

## 2020-02-12 DIAGNOSIS — I10 Essential (primary) hypertension: Secondary | ICD-10-CM | POA: Diagnosis not present

## 2020-04-03 DIAGNOSIS — H903 Sensorineural hearing loss, bilateral: Secondary | ICD-10-CM | POA: Diagnosis not present

## 2020-04-03 DIAGNOSIS — H9313 Tinnitus, bilateral: Secondary | ICD-10-CM | POA: Diagnosis not present

## 2020-04-03 DIAGNOSIS — H832X3 Labyrinthine dysfunction, bilateral: Secondary | ICD-10-CM | POA: Diagnosis not present

## 2020-04-03 DIAGNOSIS — R42 Dizziness and giddiness: Secondary | ICD-10-CM | POA: Diagnosis not present

## 2020-07-07 DIAGNOSIS — N1832 Chronic kidney disease, stage 3b: Secondary | ICD-10-CM | POA: Diagnosis not present

## 2020-07-07 DIAGNOSIS — I129 Hypertensive chronic kidney disease with stage 1 through stage 4 chronic kidney disease, or unspecified chronic kidney disease: Secondary | ICD-10-CM | POA: Diagnosis not present

## 2020-07-07 DIAGNOSIS — Z712 Person consulting for explanation of examination or test findings: Secondary | ICD-10-CM | POA: Diagnosis not present

## 2020-07-07 DIAGNOSIS — R42 Dizziness and giddiness: Secondary | ICD-10-CM | POA: Diagnosis not present

## 2020-07-10 DIAGNOSIS — F419 Anxiety disorder, unspecified: Secondary | ICD-10-CM | POA: Diagnosis not present

## 2020-07-10 DIAGNOSIS — E785 Hyperlipidemia, unspecified: Secondary | ICD-10-CM | POA: Diagnosis not present

## 2020-07-10 DIAGNOSIS — I10 Essential (primary) hypertension: Secondary | ICD-10-CM | POA: Diagnosis not present

## 2020-07-10 DIAGNOSIS — N1832 Chronic kidney disease, stage 3b: Secondary | ICD-10-CM | POA: Diagnosis not present

## 2020-09-23 DIAGNOSIS — I739 Peripheral vascular disease, unspecified: Secondary | ICD-10-CM | POA: Diagnosis not present

## 2020-10-14 DIAGNOSIS — E559 Vitamin D deficiency, unspecified: Secondary | ICD-10-CM | POA: Diagnosis not present

## 2020-10-14 DIAGNOSIS — I10 Essential (primary) hypertension: Secondary | ICD-10-CM | POA: Diagnosis not present

## 2020-10-14 DIAGNOSIS — M8580 Other specified disorders of bone density and structure, unspecified site: Secondary | ICD-10-CM | POA: Diagnosis not present

## 2020-10-21 DIAGNOSIS — N1832 Chronic kidney disease, stage 3b: Secondary | ICD-10-CM | POA: Diagnosis not present

## 2020-10-21 DIAGNOSIS — I10 Essential (primary) hypertension: Secondary | ICD-10-CM | POA: Diagnosis not present

## 2020-10-21 DIAGNOSIS — F418 Other specified anxiety disorders: Secondary | ICD-10-CM | POA: Diagnosis not present

## 2020-10-21 DIAGNOSIS — E785 Hyperlipidemia, unspecified: Secondary | ICD-10-CM | POA: Diagnosis not present

## 2020-11-07 DIAGNOSIS — Z13820 Encounter for screening for osteoporosis: Secondary | ICD-10-CM | POA: Diagnosis not present

## 2020-11-07 DIAGNOSIS — Z1211 Encounter for screening for malignant neoplasm of colon: Secondary | ICD-10-CM | POA: Diagnosis not present

## 2020-11-07 DIAGNOSIS — Z136 Encounter for screening for cardiovascular disorders: Secondary | ICD-10-CM | POA: Diagnosis not present

## 2020-11-07 DIAGNOSIS — Z0001 Encounter for general adult medical examination with abnormal findings: Secondary | ICD-10-CM | POA: Diagnosis not present

## 2020-12-12 IMAGING — CT CT HEAD W/O CM
4 series · 17 of 47 positions shown, 19 images · non-contrast
Comparison: Head CT dated 02/22/2018.

CLINICAL DATA: [AGE] female with head trauma.

EXAM:
CT HEAD WITHOUT CONTRAST
TECHNIQUE: Contiguous axial images were obtained from the base of the skull
through the vertex without intravenous contrast.

[Series 2: head w o · axial · 0.45mm/px · z∈[+38,+158]mm · 7 of 34 slices shown, 9 images]
[im 5/34  brain]
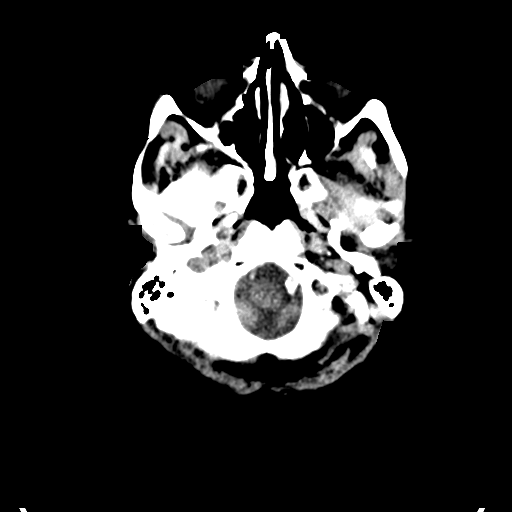
[im 5/34  bone]
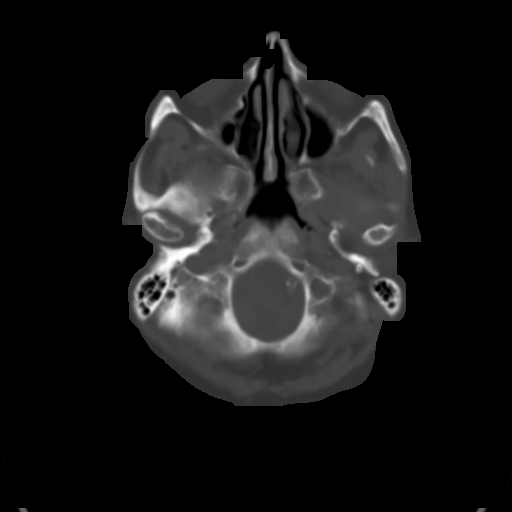
[im 9/34  brain]
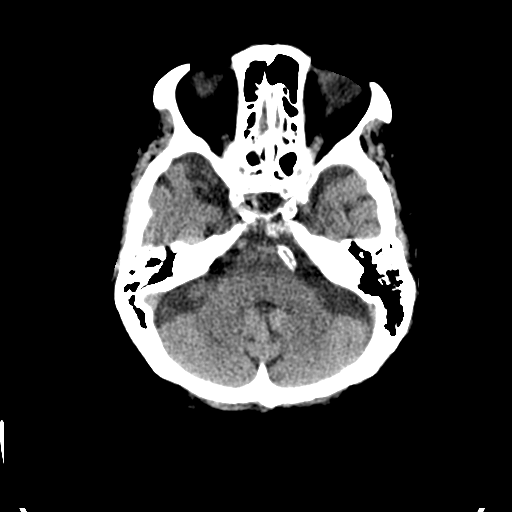
[im 13/34  brain]
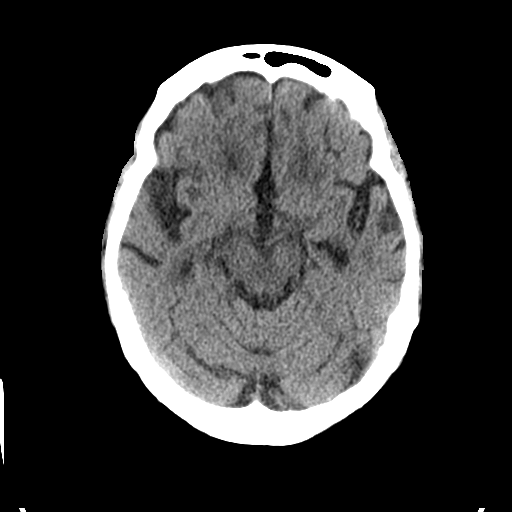
[im 17/34  brain]
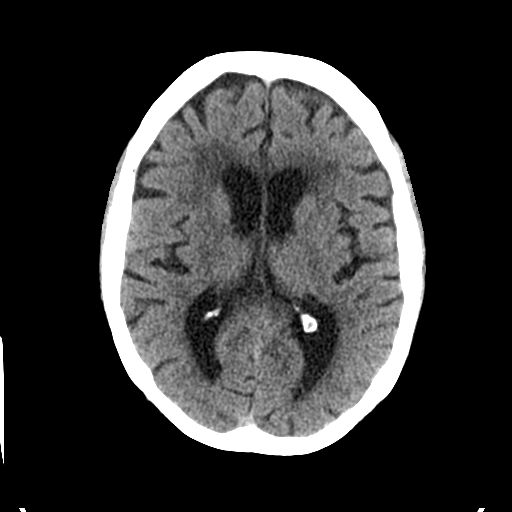
[im 21/34  brain]
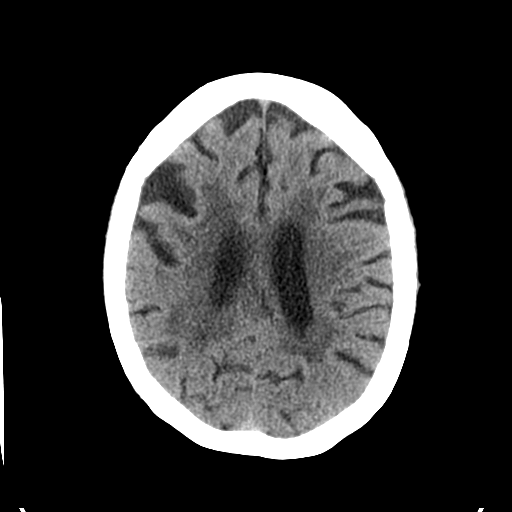
[im 21/34  bone]
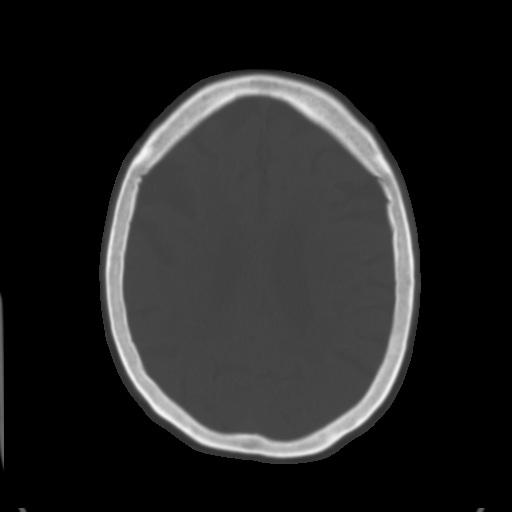
[im 25/34  brain]
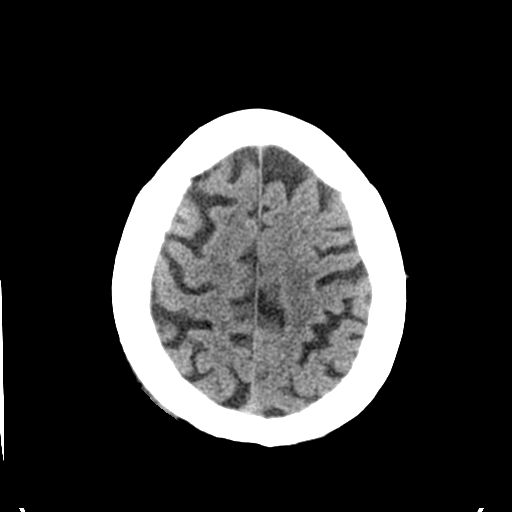
[im 29/34  brain]
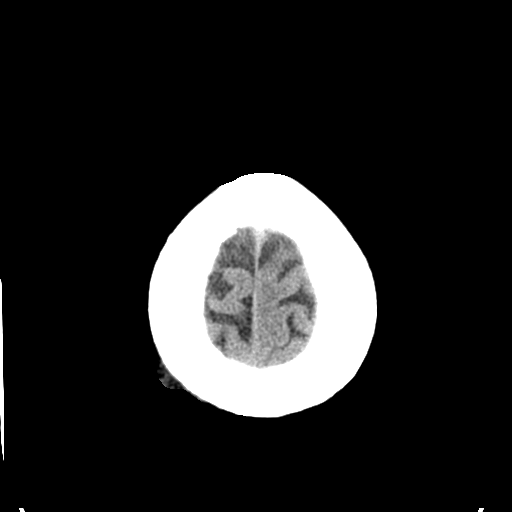

[Series 3: head bone · axial · 0.45mm/px · z∈[+34,+92]mm · 4 of 85 slices shown]
[im 9/85  bone]
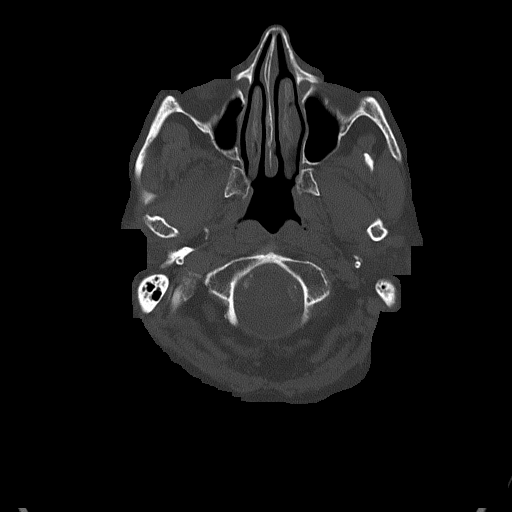
[im 17/85  bone]
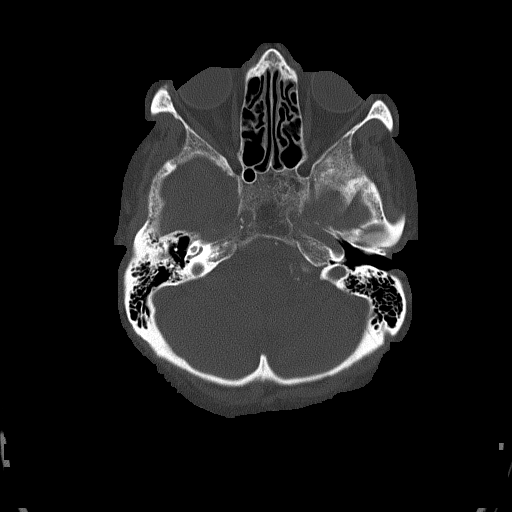
[im 26/85  bone]
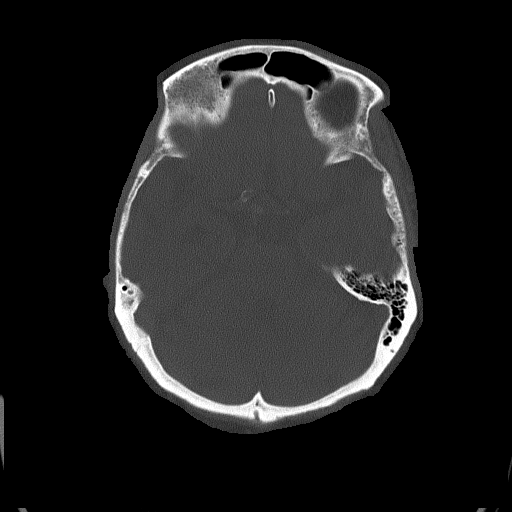
[im 38/85  bone]
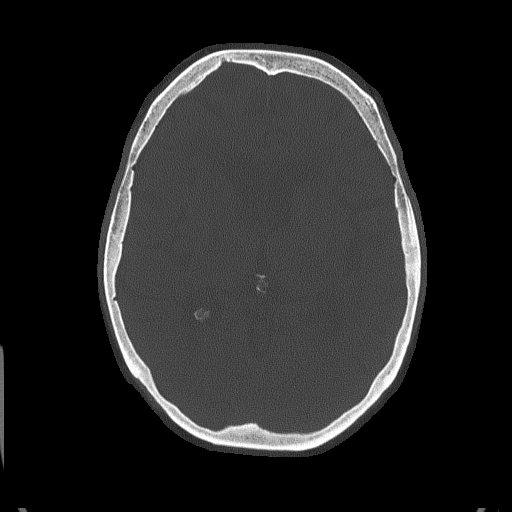

[Series 4: coronal soft · coronal · 0.33mm/px · 3 of 71 slices shown]
[im 24/71  brain]
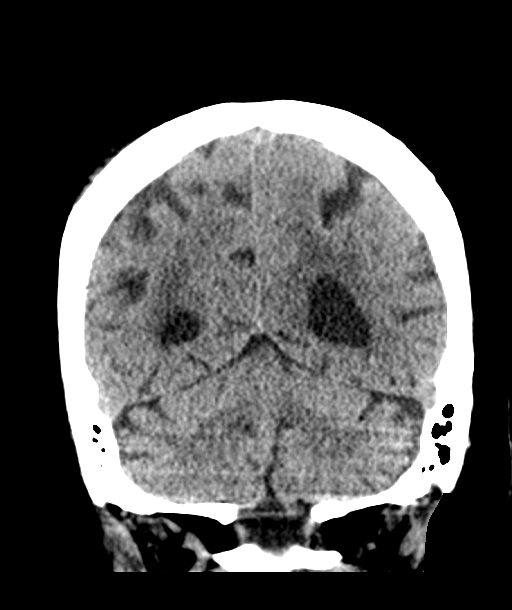
[im 32/71  brain]
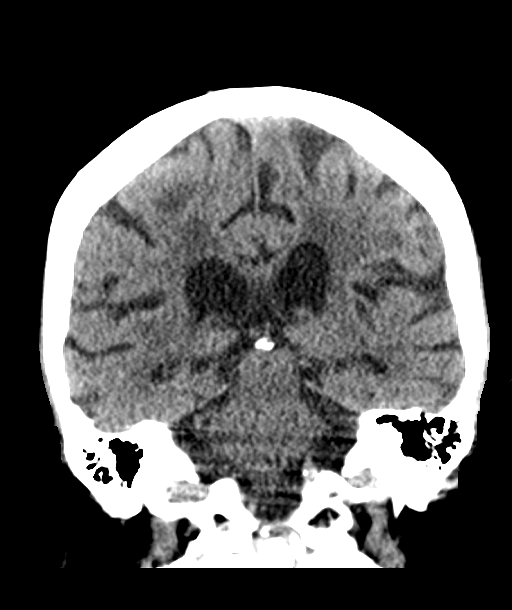
[im 39/71  brain]
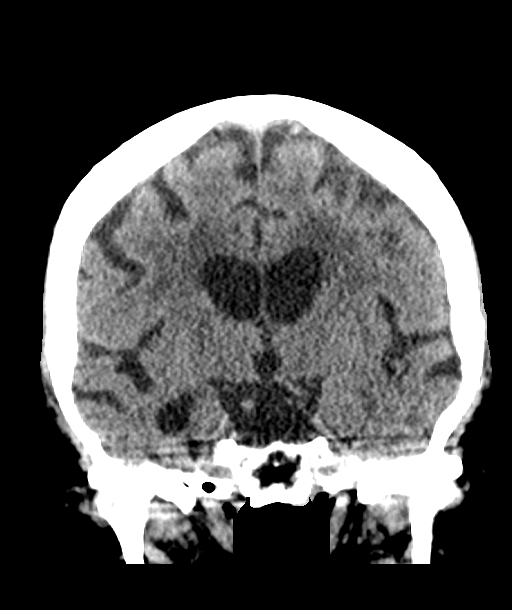

[Series 5: sagittal soft · sagittal · 0.37mm/px · 3 of 57 slices shown]
[im 19/57  brain]
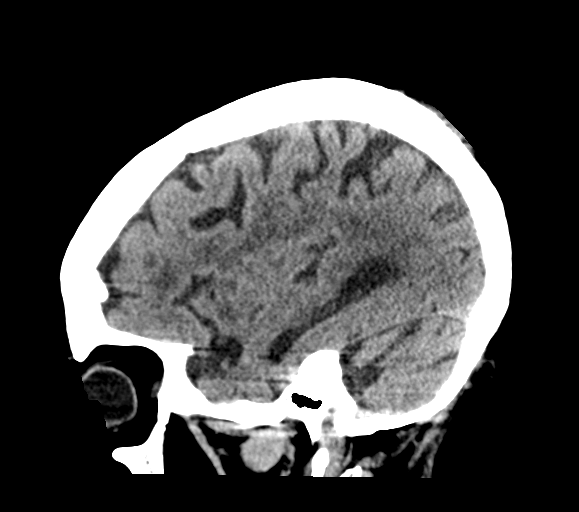
[im 29/57  brain]
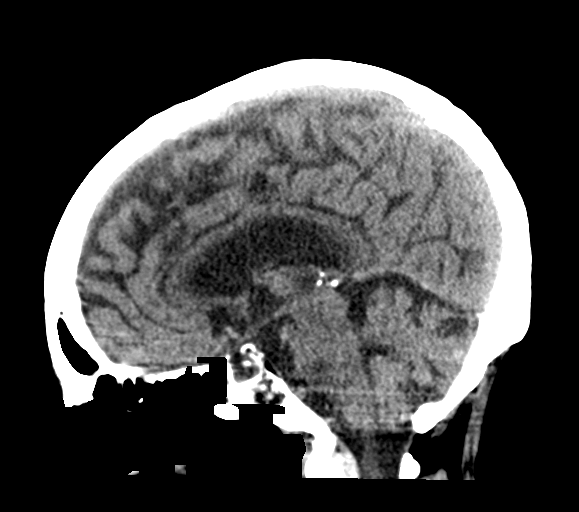
[im 38/57  brain]
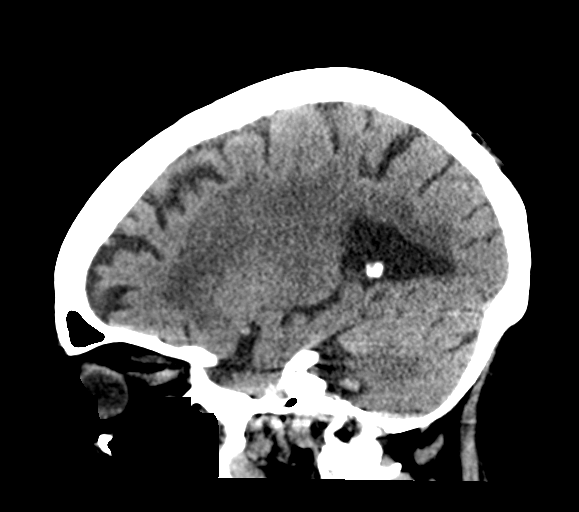

[17 of 47 positions shown; findings below may reference images not displayed]

FINDINGS: Brain: Mild age-related atrophy and moderate chronic microvascular
ischemic changes. There is no acute intracranial hemorrhage. No mass
effect or midline shift. No extra-axial fluid collection.

Vascular: No hyperdense vessel or unexpected calcification.

Skull: Normal. Negative for fracture or focal lesion.

Sinuses/Orbits: No acute finding.

Other: Right parietal scalp laceration and small hematoma.
IMPRESSION: 1. No acute intracranial hemorrhage.
2. Age-related atrophy and chronic microvascular ischemic changes.

## 2021-01-14 DIAGNOSIS — E782 Mixed hyperlipidemia: Secondary | ICD-10-CM | POA: Diagnosis not present

## 2021-01-14 DIAGNOSIS — N1832 Chronic kidney disease, stage 3b: Secondary | ICD-10-CM | POA: Diagnosis not present

## 2021-01-14 DIAGNOSIS — I10 Essential (primary) hypertension: Secondary | ICD-10-CM | POA: Diagnosis not present

## 2021-01-30 DIAGNOSIS — I10 Essential (primary) hypertension: Secondary | ICD-10-CM | POA: Diagnosis not present

## 2021-01-30 DIAGNOSIS — E1165 Type 2 diabetes mellitus with hyperglycemia: Secondary | ICD-10-CM | POA: Diagnosis not present

## 2021-02-03 DIAGNOSIS — F418 Other specified anxiety disorders: Secondary | ICD-10-CM | POA: Diagnosis not present

## 2021-02-03 DIAGNOSIS — E785 Hyperlipidemia, unspecified: Secondary | ICD-10-CM | POA: Diagnosis not present

## 2021-02-03 DIAGNOSIS — I10 Essential (primary) hypertension: Secondary | ICD-10-CM | POA: Diagnosis not present

## 2021-02-03 DIAGNOSIS — N1832 Chronic kidney disease, stage 3b: Secondary | ICD-10-CM | POA: Diagnosis not present

## 2021-07-31 DIAGNOSIS — I10 Essential (primary) hypertension: Secondary | ICD-10-CM | POA: Diagnosis not present

## 2021-07-31 DIAGNOSIS — E559 Vitamin D deficiency, unspecified: Secondary | ICD-10-CM | POA: Diagnosis not present

## 2021-08-04 DIAGNOSIS — N1832 Chronic kidney disease, stage 3b: Secondary | ICD-10-CM | POA: Diagnosis not present

## 2021-08-04 DIAGNOSIS — E785 Hyperlipidemia, unspecified: Secondary | ICD-10-CM | POA: Diagnosis not present

## 2021-08-04 DIAGNOSIS — I10 Essential (primary) hypertension: Secondary | ICD-10-CM | POA: Diagnosis not present

## 2021-08-04 DIAGNOSIS — F418 Other specified anxiety disorders: Secondary | ICD-10-CM | POA: Diagnosis not present

## 2021-11-23 DIAGNOSIS — H52223 Regular astigmatism, bilateral: Secondary | ICD-10-CM | POA: Diagnosis not present

## 2022-01-27 DIAGNOSIS — E785 Hyperlipidemia, unspecified: Secondary | ICD-10-CM | POA: Diagnosis not present

## 2022-01-27 DIAGNOSIS — R809 Proteinuria, unspecified: Secondary | ICD-10-CM | POA: Diagnosis not present

## 2022-01-27 DIAGNOSIS — E559 Vitamin D deficiency, unspecified: Secondary | ICD-10-CM | POA: Diagnosis not present

## 2022-02-03 DIAGNOSIS — E785 Hyperlipidemia, unspecified: Secondary | ICD-10-CM | POA: Diagnosis not present

## 2022-02-03 DIAGNOSIS — I10 Essential (primary) hypertension: Secondary | ICD-10-CM | POA: Diagnosis not present

## 2022-02-03 DIAGNOSIS — F418 Other specified anxiety disorders: Secondary | ICD-10-CM | POA: Diagnosis not present

## 2022-02-03 DIAGNOSIS — N1832 Chronic kidney disease, stage 3b: Secondary | ICD-10-CM | POA: Diagnosis not present

## 2022-03-04 ENCOUNTER — Emergency Department (HOSPITAL_COMMUNITY)
Admission: EM | Admit: 2022-03-04 | Discharge: 2022-03-04 | Disposition: A | Discharge status: Home / Self Care | Payer: Medicare Other | Attending: Emergency Medicine | Admitting: Emergency Medicine

## 2022-03-04 ENCOUNTER — Encounter (HOSPITAL_COMMUNITY): Payer: Self-pay | Admitting: Emergency Medicine

## 2022-03-04 DIAGNOSIS — W1830XA Fall on same level, unspecified, initial encounter: Secondary | ICD-10-CM | POA: Insufficient documentation

## 2022-03-04 DIAGNOSIS — S8991XA Unspecified injury of right lower leg, initial encounter: Secondary | ICD-10-CM | POA: Diagnosis present

## 2022-03-04 DIAGNOSIS — M79606 Pain in leg, unspecified: Secondary | ICD-10-CM | POA: Diagnosis not present

## 2022-03-04 DIAGNOSIS — S81811A Laceration without foreign body, right lower leg, initial encounter: Secondary | ICD-10-CM | POA: Diagnosis not present

## 2022-03-04 DIAGNOSIS — R5381 Other malaise: Secondary | ICD-10-CM | POA: Diagnosis not present

## 2022-03-04 DIAGNOSIS — I10 Essential (primary) hypertension: Secondary | ICD-10-CM | POA: Diagnosis not present

## 2022-03-04 DIAGNOSIS — W19XXXA Unspecified fall, initial encounter: Secondary | ICD-10-CM | POA: Diagnosis not present

## 2022-03-04 MED ORDER — LIDOCAINE HCL (PF) 1 % IJ SOLN
INTRAMUSCULAR | Status: AC
  Filled 2022-03-04: qty 30

## 2022-03-04 NOTE — ED Provider Notes (Signed)
[facility] EMERGENCY DEPARTMENT Provider Note   CSN: [id number] Arrival date & time: 03/04/22  0255     History  Chief Complaint  Patient presents with   Extremity Laceration    Catherine West is a 88 y.o. female.  Patient is a 88 year old female with past medical history of hypertension, osteoporosis, hyperlipidemia.  Patient presenting with complaints of fall.  She got up to go to the bathroom and fell.  She struck the front of her leg on an unknown object and caused a laceration to the right shin.  Bleeding controlled with direct pressure.  She was able to ambulate afterward and attempted to stop the bleeding with a Kleenex.  She called 911, then was transported here.  ABD pad placed over the dressing along with Kerlix.  She denies other injury.  She denies having struck her head or lost consciousness.  She has no other complaints.  The history is provided by the patient.       Home Medications Prior to Admission medications   Medication Sig Start Date End Date Taking? Authorizing Provider  hydrochlorothiazide (MICROZIDE) 12.5 MG capsule Take 1 capsule by mouth daily. 01/15/19   [provider]  losartan (COZAAR) 100 MG tablet Take 1 tablet by mouth daily. 01/15/19   [provider]  metoprolol succinate (TOPROL-XL) 100 MG 24 hr tablet Take 1 tablet by mouth daily. 01/15/19   [provider]  predniSONE (STERAPRED UNI-PAK 21 TAB) 10 MG (21) TBPK tablet Take by mouth daily. Take 6 tabs by mouth daily  for 2 days, then 5 tabs for 2 days, then 4 tabs for 2 days, then 3 tabs for 2 days, 2 tabs for 2 days, then 1 tab by mouth daily for 2 days 06/28/19   Stacey Drain, [place], PA-C  sertraline (ZOLOFT) 100 MG tablet Take 100 mg by mouth daily.    [provider]  trimethoprim (TRIMPEX) 100 MG tablet Take 100 mg by mouth daily.    [provider]      Allergies    Aspirin    Review of Systems   Review of Systems  All other systems reviewed and  are negative.   Physical Exam Updated Vital Signs BP (!) 201/77   Pulse 60   Temp 97.9 F (36.6 C)   Resp 19   Ht 5\' 2"  (1.575 m)   Wt 64.9 kg   SpO2 95%   BMI 26.17 kg/m  Physical Exam Vitals and nursing note reviewed.  Constitutional:      General: She is not in acute distress.    Appearance: She is well-developed. She is not diaphoretic.  HENT:     Head: Normocephalic and atraumatic.  Cardiovascular:     Rate and Rhythm: Normal rate and regular rhythm.     Heart sounds: No murmur heard.    No friction rub. No gallop.  Pulmonary:     Effort: Pulmonary effort is normal. No respiratory distress.     Breath sounds: Normal breath sounds. No wheezing.  Abdominal:     General: Bowel sounds are normal. There is no distension.     Palpations: Abdomen is soft.     Tenderness: There is no abdominal tenderness.  Musculoskeletal:        General: Normal range of motion.     Cervical back: Normal range of motion and neck supple.     Comments: To the right anterior shin, there is an L-shaped laceration extending approximately 12 cm.  The  inferior aspect of the wound involves deeper tissues, but the superior aspect is a skin tear.  Distal PMS is intact.  Skin:    General: Skin is warm and dry.  Neurological:     General: No focal deficit present.     Mental Status: She is alert and oriented to person, place, and time.     ED Results / Procedures / Treatments   Labs (all labs ordered are listed, but only abnormal results are displayed) Labs Reviewed - No data to display  EKG None  Radiology No results found.  Procedures Procedures    Medications Ordered in ED Medications  lidocaine (PF) (XYLOCAINE) 1 % injection (  Given 03/04/22 0332)    ED Course/ Medical Decision Making/ A&P  Patient presenting with complaints of a fall as described in the HPI.  She has a laceration to the right shin.  This was repaired as below.  Patient is complaining of no other injury and has  no other complaints.  LACERATION REPAIR Performed by: Veryl Speak Authorized by: Veryl Speak Consent: Verbal consent obtained. Risks and benefits: risks, benefits and alternatives were discussed Consent given by: patient Patient identity confirmed: provided demographic data Prepped and Draped in normal sterile fashion Wound explored  Laceration Location: right lower leg  Laceration Length: 12cm  No Foreign Bodies seen or palpated  Anesthesia: local infiltration  Local anesthetic: lidocaine 1% without epinephrine  Anesthetic total: 4 ml  Irrigation method: syringe Amount of cleaning: standard  Skin closure: 3-0 ethilon  Number of sutures: 4  Technique: simple interrupted  Patient tolerance: Patient tolerated the procedure well with no immediate complications.   The superior aspect of the wound is a skin tear, but as it extends inferiorly, goes into the deeper tissues.  The inferior aspect was sutured with 4 sutures, then the superior aspect secured into place with Steri-Strips and benzoin.  Final Clinical Impression(s) / ED Diagnoses Final diagnoses:  None    Rx / DC Orders ED Discharge Orders     None         Veryl Speak, MD 03/04/22 [id number]

## 2022-03-04 NOTE — Discharge Instructions (Signed)
Local wound care with bacitracin and dressing changes once daily.  Allow the Steri-Strips to fall off on their own, then follow-up with your doctor afterward for suture removal.  Return to the emergency department for worsening pain, pus draining from the wound, redness surrounding the wound, or for other new and concerning symptoms.

## 2022-03-04 NOTE — ED Triage Notes (Signed)
Pt BIB [facility] from home after pt had mechanical fall causing laceration to right lower leg. EMS placed bandage, bleeding controlled at this time.

## 2022-03-12 DIAGNOSIS — S81811D Laceration without foreign body, right lower leg, subsequent encounter: Secondary | ICD-10-CM | POA: Diagnosis not present

## 2022-07-29 DIAGNOSIS — E559 Vitamin D deficiency, unspecified: Secondary | ICD-10-CM | POA: Diagnosis not present

## 2022-07-29 DIAGNOSIS — R809 Proteinuria, unspecified: Secondary | ICD-10-CM | POA: Diagnosis not present

## 2022-07-29 DIAGNOSIS — E785 Hyperlipidemia, unspecified: Secondary | ICD-10-CM | POA: Diagnosis not present

## 2022-08-05 DIAGNOSIS — I129 Hypertensive chronic kidney disease with stage 1 through stage 4 chronic kidney disease, or unspecified chronic kidney disease: Secondary | ICD-10-CM | POA: Diagnosis not present

## 2022-08-05 DIAGNOSIS — I10 Essential (primary) hypertension: Secondary | ICD-10-CM | POA: Diagnosis not present

## 2022-08-05 DIAGNOSIS — F418 Other specified anxiety disorders: Secondary | ICD-10-CM | POA: Diagnosis not present

## 2022-08-05 DIAGNOSIS — N1832 Chronic kidney disease, stage 3b: Secondary | ICD-10-CM | POA: Diagnosis not present

## 2022-08-05 DIAGNOSIS — Z0001 Encounter for general adult medical examination with abnormal findings: Secondary | ICD-10-CM | POA: Diagnosis not present

## 2022-08-05 DIAGNOSIS — E785 Hyperlipidemia, unspecified: Secondary | ICD-10-CM | POA: Diagnosis not present

## 2022-08-20 DIAGNOSIS — M545 Low back pain, unspecified: Secondary | ICD-10-CM | POA: Diagnosis not present

## 2022-08-20 DIAGNOSIS — R296 Repeated falls: Secondary | ICD-10-CM | POA: Diagnosis not present

## 2022-08-20 DIAGNOSIS — I10 Essential (primary) hypertension: Secondary | ICD-10-CM | POA: Diagnosis not present

## 2022-08-23 ENCOUNTER — Ambulatory Visit (HOSPITAL_COMMUNITY)
Admission: RE | Admit: 2022-08-23 | Discharge: 2022-08-23 | Disposition: A | Discharge status: Home / Self Care | Payer: Medicare Other | Source: Ambulatory Visit | Attending: Internal Medicine | Admitting: Internal Medicine

## 2022-08-23 ENCOUNTER — Ambulatory Visit
Admission: EM | Admit: 2022-08-23 | Discharge: 2022-08-23 | Disposition: A | Discharge status: Home / Self Care | Payer: Medicare Other

## 2022-08-23 ENCOUNTER — Other Ambulatory Visit (HOSPITAL_COMMUNITY): Payer: Self-pay | Admitting: Internal Medicine

## 2022-08-23 DIAGNOSIS — M545 Low back pain, unspecified: Secondary | ICD-10-CM

## 2022-08-23 DIAGNOSIS — G8929 Other chronic pain: Secondary | ICD-10-CM | POA: Diagnosis not present

## 2022-08-23 DIAGNOSIS — M4126 Other idiopathic scoliosis, lumbar region: Secondary | ICD-10-CM | POA: Diagnosis not present

## 2022-08-23 NOTE — ED Triage Notes (Signed)
Lower back pain that started 3 days ago. Pain when trying to move or bend down. Pt states she also is having some lower abdominal cramping that comes and goes.

## 2022-08-23 NOTE — ED Provider Notes (Signed)
[facility]    CSN: [id number] Arrival date & time: 08/23/22  1253      History   Chief Complaint Chief Complaint  Patient presents with   Back Pain    HPI Catherine West is a 89 y.o. female.   Patient presents today with son and daughter.  They report 1 day history of significant chronic low back pain that has acutely worsened.  Reports the patient called them earlier this morning stating she could not get out of bed because of the pain.  EMS was called and recommended they transported her up here for an x-ray of her back.  They reported the hospital was full and she would have to wait for a very long time to be seen in the emergency room.  No new urinary incontinence, recent fall, injury, or trauma to the back.  No dysuria, urinary frequency, urgency, foul urinary odor, new abdominal pain, or new back pain.  No fever or nausea/vomiting.  History is a little bit difficult to obtain as patient's son and daughter seem to disagree on the history.  Son reports the back pain has been ongoing for the past 5 to 6 months.  Patient reports she has taken an entire bottle of Tylenol PM throughout the past few months without improvement in her back pain.  The daughter then reports the back pain has been present for 3 days.  The patient is sitting in a wheelchair today.  She is not typically wheelchair-bound and is ambulatory at home with a cane.    Past Medical History:  Diagnosis Date   Acute bronchitis    Acute bronchitis    Chronic kidney disease, stage 2 (mild)    Chronic kidney disease, unspecified    Cough    Essential (primary) hypertension    HTN (hypertension)    Hyperlipidemia, unspecified    Hypertensive chronic kidney disease with stage 1 through stage 4 chronic kidney disease, or unspecified chronic kidney disease    Hypothyroidism, unspecified    Major depressive disorder, single episode, unspecified    Osteoporosis    Osteoporosis    Pneumonia    Pneumonia     Urinary tract infection    Urinary tract infection     Patient Active Problem List   Diagnosis Date Noted   Essential (primary) hypertension    Hyperlipidemia, unspecified    Major depressive disorder, single episode, unspecified    Chronic kidney disease, stage 2 (mild)    Chronic kidney disease, unspecified    Cough    Hypertensive chronic kidney disease with stage 1 through stage 4 chronic kidney disease, or unspecified chronic kidney disease    Hypothyroidism, unspecified    Osteoporosis    Gait abnormality 02/22/2019   Falls frequently 02/22/2019   COLLES' FRACTURE, RIGHT 04/15/2009    History reviewed. No pertinent surgical history.  OB History   No obstetric history on file.      Home Medications    Prior to Admission medications   Medication Sig Start Date End Date Taking? Authorizing Provider  losartan (COZAAR) 100 MG tablet Take 1 tablet by mouth daily. 01/15/19  Yes [provider]  metoprolol succinate (TOPROL-XL) 100 MG 24 hr tablet Take 1 tablet by mouth daily. 01/15/19  Yes [provider]  sertraline (ZOLOFT) 100 MG tablet Take 100 mg by mouth daily.   Yes [provider]  hydrochlorothiazide (MICROZIDE) 12.5 MG capsule Take 1 capsule by mouth daily. 01/15/19   [provider]  predniSONE (STERAPRED UNI-PAK 21 TAB) 10 MG (21) TBPK tablet Take by mouth daily. Take 6 tabs by mouth daily  for 2 days, then 5 tabs for 2 days, then 4 tabs for 2 days, then 3 tabs for 2 days, 2 tabs for 2 days, then 1 tab by mouth daily for 2 days 06/28/19   Alvino Chapel, [place], PA-C  trimethoprim (TRIMPEX) 100 MG tablet Take 100 mg by mouth daily.    [provider]    Family History Family History  Problem Relation Age of Onset   Other Father        brain tumor, inoperable   Emphysema Sister        smoker   Stroke Neg Hx    Parkinson's disease Neg Hx    Dementia Neg Hx     Social History Social History   Tobacco Use   Smoking  status: Never   Smokeless tobacco: Never  Vaping Use   Vaping Use: Never used  Substance Use Topics   Alcohol use: No   Drug use: No     Allergies   Aspirin   Review of Systems Review of Systems Per HPI  Physical Exam Triage Vital Signs ED Triage Vitals  Enc Vitals Group     BP 08/23/22 1412 (!) 164/114     Pulse Rate 08/23/22 1410 (!) 52     Resp 08/23/22 1410 18     Temp 08/23/22 1410 99.4 F (37.4 C)     Temp Source 08/23/22 1410 Oral     SpO2 08/23/22 1410 94 %     Weight --      Height --      Head Circumference --      Peak Flow --      Pain Score 08/23/22 1411 7     Pain Loc --      Pain Edu? --      Excl. in GC? --    No data found.  Updated Vital Signs BP (!) 164/114   Pulse (!) 52   Temp 99.4 F (37.4 C) (Oral)   Resp 18   SpO2 94%   Visual Acuity Right Eye Distance:   Left Eye Distance:   Bilateral Distance:    Right Eye Near:   Left Eye Near:    Bilateral Near:     Physical examination abbreviated today given recommendation to go to emergency room Physical Exam Vitals and nursing note reviewed.  Constitutional:      General: She is not in acute distress.    Appearance: Normal appearance. She is not toxic-appearing.  Pulmonary:     Effort: Pulmonary effort is normal. No respiratory distress.  Musculoskeletal:     Right lower leg: No edema.     Left lower leg: No edema.  Skin:    General: Skin is warm and dry.     Capillary Refill: Capillary refill takes less than 2 seconds.     Coloration: Skin is not jaundiced or pale.     Findings: No erythema or rash.  Neurological:     Mental Status: She is alert and oriented to person, place, and time.     Motor: Weakness present.     Gait: Gait abnormal (sitting in wheelchair).  Psychiatric:        Behavior: Behavior is cooperative.      UC Treatments / Results  Labs (all labs ordered are listed, but only abnormal results are displayed) Labs Reviewed - No data  to  display  EKG   Radiology No results found.  Procedures Procedures (including critical care time)  Medications Ordered in UC Medications - No data to display  Initial Impression / Assessment and Plan / UC Course  I have reviewed the triage vital signs and the nursing notes.  Pertinent labs & imaging results that were available during my care of the patient were reviewed by me and considered in my medical decision making (see chart for details).   Patient is well-appearing, afebrile, not tachycardic, not tachypneic, oxygenating well on room air.  Patient is hypertensive in urgent care today.  1. Chronic midline low back pain, unspecified whether sciatica present During triage, unable to obtain urine sample due to patient not able to move from wheelchair to commode Patient also able not able to stand freely for lumbar x-ray Given chronicity of pain acutely worsening, recommended further evaluation and management emergency room Patient is safe to transport via private vehicle at this time  The patient was given the opportunity to ask questions.  All questions answered to their satisfaction.  The patient is in agreement to this plan.    Final Clinical Impressions(s) / UC Diagnoses   Final diagnoses:  Chronic midline low back pain, unspecified whether sciatica present     Discharge Instructions      Please go to the ER for further evaluation and management of back pain    ED Prescriptions   None    PDMP not reviewed this encounter.   Valentino Nose, NP 08/23/22 1505

## 2022-08-23 NOTE — Discharge Instructions (Signed)
Please go to the ER for further evaluation and management of back pain

## 2022-08-30 DIAGNOSIS — M545 Low back pain, unspecified: Secondary | ICD-10-CM | POA: Diagnosis not present

## 2022-08-30 DIAGNOSIS — M6281 Muscle weakness (generalized): Secondary | ICD-10-CM | POA: Diagnosis not present

## 2022-08-30 DIAGNOSIS — M810 Age-related osteoporosis without current pathological fracture: Secondary | ICD-10-CM | POA: Diagnosis not present

## 2022-08-30 DIAGNOSIS — N189 Chronic kidney disease, unspecified: Secondary | ICD-10-CM | POA: Diagnosis not present

## 2022-09-04 ENCOUNTER — Emergency Department (HOSPITAL_COMMUNITY): Payer: Medicare Other

## 2022-09-04 ENCOUNTER — Other Ambulatory Visit (HOSPITAL_COMMUNITY): Payer: Medicare Other

## 2022-09-04 ENCOUNTER — Other Ambulatory Visit: Payer: Self-pay

## 2022-09-04 ENCOUNTER — Encounter (HOSPITAL_COMMUNITY): Payer: Self-pay

## 2022-09-04 ENCOUNTER — Inpatient Hospital Stay (HOSPITAL_COMMUNITY)
Admission: EM | Admit: 2022-09-04 | Discharge: 2022-09-07 | DRG: 291 | Disposition: A | Discharge status: Skilled Nursing Facility | Payer: Medicare Other | Source: Skilled Nursing Facility | Attending: Internal Medicine | Admitting: Internal Medicine

## 2022-09-04 DIAGNOSIS — Z1152 Encounter for screening for COVID-19: Secondary | ICD-10-CM

## 2022-09-04 DIAGNOSIS — R296 Repeated falls: Secondary | ICD-10-CM | POA: Diagnosis not present

## 2022-09-04 DIAGNOSIS — M545 Low back pain, unspecified: Secondary | ICD-10-CM | POA: Diagnosis not present

## 2022-09-04 DIAGNOSIS — E785 Hyperlipidemia, unspecified: Secondary | ICD-10-CM | POA: Diagnosis not present

## 2022-09-04 DIAGNOSIS — Z66 Do not resuscitate: Secondary | ICD-10-CM | POA: Diagnosis not present

## 2022-09-04 DIAGNOSIS — R109 Unspecified abdominal pain: Secondary | ICD-10-CM | POA: Diagnosis present

## 2022-09-04 DIAGNOSIS — Z825 Family history of asthma and other chronic lower respiratory diseases: Secondary | ICD-10-CM

## 2022-09-04 DIAGNOSIS — I509 Heart failure, unspecified: Secondary | ICD-10-CM | POA: Diagnosis not present

## 2022-09-04 DIAGNOSIS — E876 Hypokalemia: Secondary | ICD-10-CM | POA: Diagnosis not present

## 2022-09-04 DIAGNOSIS — R0902 Hypoxemia: Secondary | ICD-10-CM | POA: Diagnosis not present

## 2022-09-04 DIAGNOSIS — Z79899 Other long term (current) drug therapy: Secondary | ICD-10-CM | POA: Diagnosis not present

## 2022-09-04 DIAGNOSIS — I4430 Unspecified atrioventricular block: Secondary | ICD-10-CM | POA: Diagnosis not present

## 2022-09-04 DIAGNOSIS — D72829 Elevated white blood cell count, unspecified: Secondary | ICD-10-CM | POA: Diagnosis present

## 2022-09-04 DIAGNOSIS — Z886 Allergy status to analgesic agent status: Secondary | ICD-10-CM

## 2022-09-04 DIAGNOSIS — I5043 Acute on chronic combined systolic (congestive) and diastolic (congestive) heart failure: Secondary | ICD-10-CM | POA: Diagnosis not present

## 2022-09-04 DIAGNOSIS — S32000A Wedge compression fracture of unspecified lumbar vertebra, initial encounter for closed fracture: Secondary | ICD-10-CM

## 2022-09-04 DIAGNOSIS — R269 Unspecified abnormalities of gait and mobility: Secondary | ICD-10-CM | POA: Diagnosis not present

## 2022-09-04 DIAGNOSIS — I5021 Acute systolic (congestive) heart failure: Secondary | ICD-10-CM

## 2022-09-04 DIAGNOSIS — I110 Hypertensive heart disease with heart failure: Secondary | ICD-10-CM | POA: Diagnosis not present

## 2022-09-04 DIAGNOSIS — N1330 Unspecified hydronephrosis: Secondary | ICD-10-CM | POA: Diagnosis not present

## 2022-09-04 DIAGNOSIS — M8008XD Age-related osteoporosis with current pathological fracture, vertebra(e), subsequent encounter for fracture with routine healing: Secondary | ICD-10-CM | POA: Diagnosis not present

## 2022-09-04 DIAGNOSIS — R0602 Shortness of breath: Secondary | ICD-10-CM | POA: Diagnosis not present

## 2022-09-04 DIAGNOSIS — I130 Hypertensive heart and chronic kidney disease with heart failure and stage 1 through stage 4 chronic kidney disease, or unspecified chronic kidney disease: Secondary | ICD-10-CM | POA: Diagnosis not present

## 2022-09-04 DIAGNOSIS — F32A Depression, unspecified: Secondary | ICD-10-CM | POA: Diagnosis present

## 2022-09-04 DIAGNOSIS — N200 Calculus of kidney: Secondary | ICD-10-CM | POA: Diagnosis not present

## 2022-09-04 DIAGNOSIS — J90 Pleural effusion, not elsewhere classified: Secondary | ICD-10-CM | POA: Diagnosis not present

## 2022-09-04 DIAGNOSIS — N182 Chronic kidney disease, stage 2 (mild): Secondary | ICD-10-CM | POA: Diagnosis present

## 2022-09-04 DIAGNOSIS — R001 Bradycardia, unspecified: Secondary | ICD-10-CM | POA: Diagnosis not present

## 2022-09-04 DIAGNOSIS — I10 Essential (primary) hypertension: Secondary | ICD-10-CM | POA: Diagnosis not present

## 2022-09-04 DIAGNOSIS — M549 Dorsalgia, unspecified: Secondary | ICD-10-CM | POA: Diagnosis not present

## 2022-09-04 DIAGNOSIS — E039 Hypothyroidism, unspecified: Secondary | ICD-10-CM | POA: Diagnosis not present

## 2022-09-04 DIAGNOSIS — M810 Age-related osteoporosis without current pathological fracture: Secondary | ICD-10-CM | POA: Diagnosis present

## 2022-09-04 DIAGNOSIS — I5031 Acute diastolic (congestive) heart failure: Secondary | ICD-10-CM | POA: Diagnosis not present

## 2022-09-04 LAB — BASIC METABOLIC PANEL
Anion gap: 10 (ref 5–15)
BUN: 23 mg/dL (ref 8–23)
CO2: 28 mmol/L (ref 22–32)
Calcium: 8.8 mg/dL — ABNORMAL LOW (ref 8.9–10.3)
Chloride: 97 mmol/L — ABNORMAL LOW (ref 98–111)
Creatinine, Ser: 1.15 mg/dL — ABNORMAL HIGH (ref 0.44–1.00)
GFR, Estimated: 43 mL/min — ABNORMAL LOW (ref >60)
Glucose, Bld: 137 mg/dL — ABNORMAL HIGH (ref 70–99)
Potassium: 3.1 mmol/L — ABNORMAL LOW (ref 3.5–5.1)
Sodium: 135 mmol/L (ref 135–145)

## 2022-09-04 LAB — CBC
HCT: 42.4 % (ref 36.0–46.0)
Hemoglobin: 14.3 g/dL (ref 12.0–15.0)
MCH: 32.8 pg (ref 26.0–34.0)
MCHC: 33.7 g/dL (ref 30.0–36.0)
MCV: 97.2 fL (ref 80.0–100.0)
Platelets: 214 10*3/uL (ref 150–400)
RBC: 4.36 MIL/uL (ref 3.87–5.11)
RDW: 13.3 % (ref 11.5–15.5)
WBC: 11.9 10*3/uL — ABNORMAL HIGH (ref 4.0–10.5)
nRBC: 0 % (ref 0.0–0.2)

## 2022-09-04 LAB — SARS CORONAVIRUS 2 BY RT PCR: SARS Coronavirus 2 by RT PCR: NEGATIVE

## 2022-09-04 LAB — BRAIN NATRIURETIC PEPTIDE: B Natriuretic Peptide: 2170 pg/mL — ABNORMAL HIGH (ref 0.0–100.0)

## 2022-09-04 MED ORDER — TRAMADOL HCL 50 MG PO TABS
50.0000 mg | ORAL_TABLET | Freq: Once | ORAL | Status: AC
  Administered 2022-09-04: 50 mg via ORAL
  Filled 2022-09-04: qty 1

## 2022-09-04 MED ORDER — LOSARTAN POTASSIUM 50 MG PO TABS
100.0000 mg | ORAL_TABLET | Freq: Every day | ORAL | Status: DC
  Administered 2022-09-05 – 2022-09-07 (×3): 100 mg via ORAL
  Filled 2022-09-04 (×3): qty 2

## 2022-09-04 MED ORDER — HEPARIN SODIUM (PORCINE) 5000 UNIT/ML IJ SOLN
5000.0000 [IU] | Freq: Three times a day (TID) | INTRAMUSCULAR | Status: DC
  Administered 2022-09-04 – 2022-09-07 (×8): 5000 [IU] via SUBCUTANEOUS
  Filled 2022-09-04 (×8): qty 1

## 2022-09-04 MED ORDER — ACETAMINOPHEN 325 MG PO TABS
650.0000 mg | ORAL_TABLET | Freq: Four times a day (QID) | ORAL | Status: DC | PRN
  Administered 2022-09-06: 650 mg via ORAL
  Filled 2022-09-04: qty 2

## 2022-09-04 MED ORDER — ACETAMINOPHEN 325 MG PO TABS
650.0000 mg | ORAL_TABLET | Freq: Once | ORAL | Status: AC
  Administered 2022-09-04: 650 mg via ORAL
  Filled 2022-09-04: qty 2

## 2022-09-04 MED ORDER — FUROSEMIDE 10 MG/ML IJ SOLN
40.0000 mg | Freq: Once | INTRAMUSCULAR | Status: AC
  Administered 2022-09-04: 40 mg via INTRAVENOUS
  Filled 2022-09-04: qty 4

## 2022-09-04 MED ORDER — SERTRALINE HCL 50 MG PO TABS
100.0000 mg | ORAL_TABLET | Freq: Every day | ORAL | Status: DC
  Administered 2022-09-05 – 2022-09-07 (×3): 100 mg via ORAL
  Filled 2022-09-04 (×3): qty 2

## 2022-09-04 MED ORDER — ONDANSETRON HCL 4 MG PO TABS: 4.0000 mg | ORAL_TABLET | Freq: Four times a day (QID) | ORAL | Status: DC | PRN

## 2022-09-04 MED ORDER — METOPROLOL SUCCINATE ER 50 MG PO TB24
100.0000 mg | ORAL_TABLET | Freq: Every day | ORAL | Status: DC
  Administered 2022-09-05: 100 mg via ORAL
  Filled 2022-09-04 (×2): qty 2

## 2022-09-04 MED ORDER — LABETALOL HCL 5 MG/ML IV SOLN
10.0000 mg | Freq: Once | INTRAVENOUS | Status: AC
  Administered 2022-09-04: 10 mg via INTRAVENOUS
  Filled 2022-09-04: qty 4

## 2022-09-04 MED ORDER — ACETAMINOPHEN 650 MG RE SUPP: 650.0000 mg | Freq: Four times a day (QID) | RECTAL | Status: DC | PRN

## 2022-09-04 MED ORDER — ONDANSETRON HCL 4 MG/2ML IJ SOLN: 4.0000 mg | Freq: Four times a day (QID) | INTRAMUSCULAR | Status: DC | PRN

## 2022-09-04 NOTE — H&P (Signed)
History and Physical    Catherine West:[id number] DOB: May 29, 1920 DOA: 09/04/2022  PCP: Benita Stabile, MD   Patient coming from: SNF  Chief Complaint: Back pain  HPI: Catherine West is a 89 y.o. female with medical history significant for hypertension, depression, and osteoporosis who presented to the ED with complaints of low back pain with radiation to her groin and bilateral legs.  She was noted to have significant blood pressure elevation in the ED as well as some mild hypoxemia.  She denies any chest pain, fevers, chills, or shortness of breath.  Denies any lower extremity edema.   ED Course: Vital signs stable and patient afebrile.  BNP over 2000 and leukocytosis of 11,900 noted with potassium 3.1 and creatinine 1.15.  Chest x-ray with diffuse infiltrates noted and CT scan renal stone study done on account of abdominal/flank pain with no findings of renal stone.  There is some trace bilateral stones.  Patient was given pain medications and started on IV Lasix with improvements in blood pressure readings.  Review of Systems: Reviewed as noted above, otherwise negative.  Past Medical History:  Diagnosis Date   Acute bronchitis    Acute bronchitis    Chronic kidney disease, stage 2 (mild)    Chronic kidney disease, unspecified    Cough    Essential (primary) hypertension    HTN (hypertension)    Hyperlipidemia, unspecified    Hypertensive chronic kidney disease with stage 1 through stage 4 chronic kidney disease, or unspecified chronic kidney disease    Hypothyroidism, unspecified    Major depressive disorder, single episode, unspecified    Osteoporosis    Osteoporosis    Pneumonia    Pneumonia    Urinary tract infection    Urinary tract infection     History reviewed. No pertinent surgical history.   reports that she has never smoked. She has never used smokeless tobacco. She reports that she does not drink alcohol and does not use drugs.  Allergies  Allergen  Reactions   Aspirin Palpitations    Pt has no knowledge of this     Family History  Problem Relation Age of Onset   Other Father        brain tumor, inoperable   Emphysema Sister        smoker   Stroke Neg Hx    Parkinson's disease Neg Hx    Dementia Neg Hx     Prior to Admission medications   Medication Sig Start Date End Date Taking? Authorizing Provider  hydrochlorothiazide (MICROZIDE) 12.5 MG capsule Take 1 capsule by mouth daily. 01/15/19   [provider]  losartan (COZAAR) 100 MG tablet Take 1 tablet by mouth daily. 01/15/19   [provider]  metoprolol succinate (TOPROL-XL) 100 MG 24 hr tablet Take 1 tablet by mouth daily. 01/15/19   [provider]  predniSONE (STERAPRED UNI-PAK 21 TAB) 10 MG (21) TBPK tablet Take by mouth daily. Take 6 tabs by mouth daily  for 2 days, then 5 tabs for 2 days, then 4 tabs for 2 days, then 3 tabs for 2 days, 2 tabs for 2 days, then 1 tab by mouth daily for 2 days 06/28/19   Alvino Chapel, [place], PA-C  sertraline (ZOLOFT) 100 MG tablet Take 100 mg by mouth daily.    [provider]  trimethoprim (TRIMPEX) 100 MG tablet Take 100 mg by mouth daily.    [provider]    Physical Exam: Vitals:  09/04/22 1156 09/04/22 1200 09/04/22 1230 09/04/22 1346  BP: (!) 177/90 (!) 218/99 (!) 201/114 (!) 146/64  Pulse: 68 65 63 60  Resp: 16   16  Temp: 97.7 F (36.5 C)   97.9 F (36.6 C)  TempSrc: Oral   Oral  SpO2: 93% 92% (!) 89% 96%    Constitutional: NAD, calm, comfortable Vitals:   09/04/22 1156 09/04/22 1200 09/04/22 1230 09/04/22 1346  BP: (!) 177/90 (!) 218/99 (!) 201/114 (!) 146/64  Pulse: 68 65 63 60  Resp: 16   16  Temp: 97.7 F (36.5 C)   97.9 F (36.6 C)  TempSrc: Oral   Oral  SpO2: 93% 92% (!) 89% 96%   Eyes: lids and conjunctivae normal Neck: normal, supple Respiratory: clear to auscultation bilaterally. Normal respiratory effort. No accessory muscle use.  Cardiovascular: Regular rate  and rhythm, no murmurs. Abdomen: no tenderness, no distention. Bowel sounds positive.  Musculoskeletal:  No edema. Skin: no rashes, lesions, ulcers.  Psychiatric: Flat affect  Labs on Admission: I have personally reviewed following labs and imaging studies  CBC: Recent Labs  Lab 09/04/22 1345  WBC 11.9*  HGB 14.3  HCT 42.4  MCV 97.2  PLT 214   Basic Metabolic Panel: Recent Labs  Lab 09/04/22 1345  NA 135  K 3.1*  CL 97*  CO2 28  GLUCOSE 137*  BUN 23  CREATININE 1.15*  CALCIUM 8.8*   GFR: CrCl cannot be calculated (Unknown ideal weight.). Liver Function Tests: No results for input(s): "AST", "ALT", "ALKPHOS", "BILITOT", "PROT", "ALBUMIN" in the last 168 hours. No results for input(s): "LIPASE", "AMYLASE" in the last 168 hours. No results for input(s): "AMMONIA" in the last 168 hours. Coagulation Profile: No results for input(s): "INR", "PROTIME" in the last 168 hours. Cardiac Enzymes: No results for input(s): "CKTOTAL", "CKMB", "CKMBINDEX", "TROPONINI" in the last 168 hours. BNP (last 3 results) No results for input(s): "PROBNP" in the last 8760 hours. HbA1C: No results for input(s): "HGBA1C" in the last 72 hours. CBG: No results for input(s): "GLUCAP" in the last 168 hours. Lipid Profile: No results for input(s): "CHOL", "HDL", "LDLCALC", "TRIG", "CHOLHDL", "LDLDIRECT" in the last 72 hours. Thyroid Function Tests: No results for input(s): "TSH", "T4TOTAL", "FREET4", "T3FREE", "THYROIDAB" in the last 72 hours. Anemia Panel: No results for input(s): "VITAMINB12", "FOLATE", "FERRITIN", "TIBC", "IRON", "RETICCTPCT" in the last 72 hours. Urine analysis:    Component Value Date/Time   COLORURINE BROWN (A) 02/22/2018 0146   APPEARANCEUR CLOUDY (A) 02/22/2018 0146   LABSPEC 1.015 02/22/2018 0146   PHURINE 6.5 02/22/2018 0146   GLUCOSEU NEGATIVE 02/22/2018 0146   HGBUR LARGE (A) 02/22/2018 0146   BILIRUBINUR MODERATE (A) 02/22/2018 0146   KETONESUR TRACE (A)  02/22/2018 0146   PROTEINUR >300 (A) 02/22/2018 0146   NITRITE POSITIVE (A) 02/22/2018 0146   LEUKOCYTESUR MODERATE (A) 02/22/2018 0146    Radiological Exams on Admission: DG Chest Port 1 View  Result Date: 09/04/2022 CLINICAL DATA:  Shortness of breath. EXAM: PORTABLE CHEST 1 VIEW COMPARISON:  02/22/2018 FINDINGS: Heart size remains within normal limits. Ectasia and atherosclerotic calcification of thoracic aorta again noted. Diffuse interstitial infiltrates are seen which are new. Tiny bilateral pleural effusions are also seen. No evidence of pulmonary consolidation. IMPRESSION: New diffuse interstitial infiltrates, which may be due to interstitial edema or pneumonitis. Tiny bilateral pleural effusions. Electronically Signed   By: Danae Orleans M.D.   On: 09/04/2022 13:27   CT Renal Stone Study  Result Date: 09/04/2022 CLINICAL  DATA:  Abdominal/flank pain EXAM: CT ABDOMEN AND PELVIS WITHOUT CONTRAST TECHNIQUE: Multidetector CT imaging of the abdomen and pelvis was performed following the standard protocol without IV contrast. RADIATION DOSE REDUCTION: This exam was performed according to the departmental dose-optimization program which includes automated exposure control, adjustment of the mA and/or kV according to patient size and/or use of iterative reconstruction technique. COMPARISON:  Lumbar spine radiograph dated August 23, 2022 FINDINGS: Lower chest: Cardiomegaly. Small right and trace left pleural effusions. Small hiatal hernia. Bilateral bronchiectasis, small nodules and more confluent areas of consolidation. Hepatobiliary: No focal liver abnormality is seen. No gallstones, gallbladder wall thickening, or biliary dilatation. Pancreas: Unremarkable. No pancreatic ductal dilatation or surrounding inflammatory changes. Spleen: Normal in size without focal abnormality. Adrenals/Urinary Tract: Small low-attenuation left adrenal gland nodule measuring 1.2 cm on series 2, image 14, likely benign adenoma  with no specific follow-up imaging recommended. Hydronephrosis. Punctate bilateral nonobstructing stones of the bilateral upper poles. Bladder is unremarkable. Stomach/Bowel: Stomach is within normal limits. Mild diverticulosis. No evidence of bowel wall thickening, distention, or inflammatory changes. Vascular/Lymphatic: Aortic atherosclerosis. No enlarged abdominal or pelvic lymph nodes. Reproductive: Uterus and bilateral adnexa are unremarkable. Other: No abdominal wall hernia or abnormality. No abdominopelvic ascites. Musculoskeletal: Moderate compression deformity of the inferior endplate of L1, unchanged when compared with prior lumbar spine radiograph. Dextrocurvature of the lumbar spine. Acute osseous abnormality. IMPRESSION: 1. No acute findings in the abdomen or pelvis, including no evidence of obstructive uropathy. 2. Small right and trace left pleural effusions. 3. Moderate compression deformity of the inferior endplate of L1, unchanged when compared with prior lumbar spine radiograph. 4. Bilateral bronchiectasis and small nodules with more confluent areas of consolidation, findings are likely due to chronic atypical infection. Consider dedicated chest CT for complete evaluation. 5. Aortic Atherosclerosis (ICD10-I70.0). Electronically Signed   By: Allegra Lai M.D.   On: 09/04/2022 12:50     Assessment/Plan Principal Problem:   Acute CHF (HCC) Active Problems:   Gait abnormality   Falls frequently   Essential (primary) hypertension   Hypothyroidism, unspecified   Osteoporosis    Uncontrolled hypertension with concern for acute CHF BNP elevation noted Ordered 2D echocardiogram Administer IV Lasix Continue home blood pressure medications  Low back pain Continue pain management PT evaluation  Depression Continue home medications  Osteoporosis   DVT prophylaxis: Heparin Code Status: DNR Family Communication: Grandson at bedside Disposition Plan: Admit for diuresis and  heart failure evaluation Consults called: None Admission status: Observation, telemetry  Severity of Illness: The appropriate patient status for this patient is OBSERVATION. Observation status is judged to be reasonable and necessary in order to provide the required intensity of service to ensure the patient's safety. The patient's presenting symptoms, physical exam findings, and initial radiographic and laboratory data in the context of their medical condition is felt to place them at decreased risk for further clinical deterioration. Furthermore, it is anticipated that the patient will be medically stable for discharge from the hospital within 2 midnights of admission.    Handy Mcloud D Sherryll Burger DO [facility]  If 7PM-7AM, please contact night-coverage [email or web address]  09/04/2022, 3:03 PM

## 2022-09-04 NOTE — ED Notes (Signed)
O2 sats 87-88% on RA, placed patient on oxygen @ 2L via [place]

## 2022-09-04 NOTE — ED Provider Notes (Signed)
[place] EMERGENCY DEPARTMENT AT [facility] Provider Note   CSN: [id number] Arrival date & time: 09/04/22  1143     History {Add pertinent medical, surgical, social history, OB history to HPI:1} Chief Complaint  Patient presents with   Back Pain   Hypertension   low o2 sats    Catherine West is a 89 y.o. female.  Pt c/o pain to lower back in past several days. Pain dull to sharp, constant, bil flank and low back, non radiating, worse w certain movements and positional changes. No saddle area or leg numbness. No leg weakness. No problems w normal/baseline bowel or bladder function. No fever or chills. No trauma or fall.   The history is provided by the patient, medical records and the EMS personnel.  Back Pain Associated symptoms: no abdominal pain, no chest pain, no fever, no headaches, no numbness and no weakness        Home Medications Prior to Admission medications   Medication Sig Start Date End Date Taking? Authorizing Provider  hydrochlorothiazide (MICROZIDE) 12.5 MG capsule Take 1 capsule by mouth daily. 01/15/19   [provider]  losartan (COZAAR) 100 MG tablet Take 1 tablet by mouth daily. 01/15/19   [provider]  metoprolol succinate (TOPROL-XL) 100 MG 24 hr tablet Take 1 tablet by mouth daily. 01/15/19   [provider]  predniSONE (STERAPRED UNI-PAK 21 TAB) 10 MG (21) TBPK tablet Take by mouth daily. Take 6 tabs by mouth daily  for 2 days, then 5 tabs for 2 days, then 4 tabs for 2 days, then 3 tabs for 2 days, 2 tabs for 2 days, then 1 tab by mouth daily for 2 days 06/28/19   Alvino Chapel, [place], PA-C  sertraline (ZOLOFT) 100 MG tablet Take 100 mg by mouth daily.    [provider]  trimethoprim (TRIMPEX) 100 MG tablet Take 100 mg by mouth daily.    [provider]      Allergies    Aspirin    Review of Systems   Review of Systems  Constitutional:  Negative for chills and fever.  Respiratory:  Negative  for shortness of breath.   Cardiovascular:  Negative for chest pain.  Gastrointestinal:  Negative for abdominal pain, nausea and vomiting.  Genitourinary:  Positive for flank pain.  Musculoskeletal:  Positive for back pain. Negative for neck pain.  Skin:  Negative for rash.  Neurological:  Negative for weakness, numbness and headaches.    Physical Exam Updated Vital Signs BP (!) 146/64 (BP Location: Right Arm)   Pulse 60   Temp 97.9 F (36.6 C) (Oral)   Resp 16   SpO2 96%  Physical Exam Vitals and nursing note reviewed.  Constitutional:      Appearance: Normal appearance. She is well-developed.  HENT:     Head: Atraumatic.     Nose: Nose normal.     Mouth/Throat:     Mouth: Mucous membranes are moist.  Eyes:     General: No scleral icterus.    Conjunctiva/sclera: Conjunctivae normal.  Neck:     Trachea: No tracheal deviation.  Cardiovascular:     Rate and Rhythm: Normal rate and regular rhythm.     Pulses: Normal pulses.     Heart sounds: Normal heart sounds. No murmur heard.    No friction rub. No gallop.  Pulmonary:     Effort: Pulmonary effort is normal. No respiratory distress.     Breath sounds: Normal breath sounds.  Abdominal:     General: Bowel sounds are normal. There is no distension.     Palpations: Abdomen is soft.     Tenderness: There is no abdominal tenderness. There is no guarding.     Comments: No pulsatile mass felt.   Genitourinary:    Comments: No cva tenderness.  Musculoskeletal:        General: No swelling.     Cervical back: Normal range of motion and neck supple. No rigidity. No muscular tenderness.     Comments: T/L/S spine non tender. ?scoliosis.   Skin:    General: Skin is warm and dry.     Findings: No rash.  Neurological:     Mental Status: She is alert.     Comments: Alert, speech normal. Motor/sens grossly intact bil lower extremities, stre 5/5. Sens grossly intact.   Psychiatric:        Mood and Affect: Mood normal.     ED  Results / Procedures / Treatments   Labs (all labs ordered are listed, but only abnormal results are displayed) Labs Reviewed  BRAIN NATRIURETIC PEPTIDE - Abnormal; Notable for the following components:      Result Value   B Natriuretic Peptide 2,170.0 (*)    All other components within normal limits  CBC - Abnormal; Notable for the following components:   WBC 11.9 (*)    All other components within normal limits  BASIC METABOLIC PANEL - Abnormal; Notable for the following components:   Potassium 3.1 (*)    Chloride 97 (*)    Glucose, Bld 137 (*)    Creatinine, Ser 1.15 (*)    Calcium 8.8 (*)    GFR, Estimated 43 (*)    All other components within normal limits  SARS CORONAVIRUS 2 BY RT PCR    EKG None  Radiology DG Chest Port 1 View  Result Date: 09/04/2022 CLINICAL DATA:  Shortness of breath. EXAM: PORTABLE CHEST 1 VIEW COMPARISON:  02/22/2018 FINDINGS: Heart size remains within normal limits. Ectasia and atherosclerotic calcification of thoracic aorta again noted. Diffuse interstitial infiltrates are seen which are new. Tiny bilateral pleural effusions are also seen. No evidence of pulmonary consolidation. IMPRESSION: New diffuse interstitial infiltrates, which may be due to interstitial edema or pneumonitis. Tiny bilateral pleural effusions. Electronically Signed   By: Danae Orleans M.D.   On: 09/04/2022 13:27   CT Renal Stone Study  Result Date: 09/04/2022 CLINICAL DATA:  Abdominal/flank pain EXAM: CT ABDOMEN AND PELVIS WITHOUT CONTRAST TECHNIQUE: Multidetector CT imaging of the abdomen and pelvis was performed following the standard protocol without IV contrast. RADIATION DOSE REDUCTION: This exam was performed according to the departmental dose-optimization program which includes automated exposure control, adjustment of the mA and/or kV according to patient size and/or use of iterative reconstruction technique. COMPARISON:  Lumbar spine radiograph dated August 23, 2022 FINDINGS:  Lower chest: Cardiomegaly. Small right and trace left pleural effusions. Small hiatal hernia. Bilateral bronchiectasis, small nodules and more confluent areas of consolidation. Hepatobiliary: No focal liver abnormality is seen. No gallstones, gallbladder wall thickening, or biliary dilatation. Pancreas: Unremarkable. No pancreatic ductal dilatation or surrounding inflammatory changes. Spleen: Normal in size without focal abnormality. Adrenals/Urinary Tract: Small low-attenuation left adrenal gland nodule measuring 1.2 cm on series 2, image 14, likely benign adenoma with no specific follow-up imaging recommended. Hydronephrosis. Punctate bilateral nonobstructing stones of the bilateral upper poles. Bladder is unremarkable. Stomach/Bowel: Stomach is within normal limits. Mild diverticulosis. No evidence of bowel wall thickening, distention, or  inflammatory changes. Vascular/Lymphatic: Aortic atherosclerosis. No enlarged abdominal or pelvic lymph nodes. Reproductive: Uterus and bilateral adnexa are unremarkable. Other: No abdominal wall hernia or abnormality. No abdominopelvic ascites. Musculoskeletal: Moderate compression deformity of the inferior endplate of L1, unchanged when compared with prior lumbar spine radiograph. Dextrocurvature of the lumbar spine. Acute osseous abnormality. IMPRESSION: 1. No acute findings in the abdomen or pelvis, including no evidence of obstructive uropathy. 2. Small right and trace left pleural effusions. 3. Moderate compression deformity of the inferior endplate of L1, unchanged when compared with prior lumbar spine radiograph. 4. Bilateral bronchiectasis and small nodules with more confluent areas of consolidation, findings are likely due to chronic atypical infection. Consider dedicated chest CT for complete evaluation. 5. Aortic Atherosclerosis (ICD10-I70.0). Electronically Signed   By: Allegra Lai M.D.   On: 09/04/2022 12:50    Procedures Procedures  {Document cardiac  monitor, telemetry assessment procedure when appropriate:1}  Medications Ordered in ED Medications  furosemide (LASIX) injection 40 mg (has no administration in time range)  traMADol (ULTRAM) tablet 50 mg (50 mg Oral Given 09/04/22 1254)  acetaminophen (TYLENOL) tablet 650 mg (650 mg Oral Given 09/04/22 1253)  labetalol (NORMODYNE) injection 10 mg (10 mg Intravenous Given 09/04/22 1346)    ED Course/ Medical Decision Making/ A&P   {   Click here for ABCD2, HEART and other calculatorsREFRESH Note before signing :1}                          Medical Decision Making Amount and/or Complexity of Data Reviewed Labs: ordered. Radiology: ordered.  Risk OTC drugs. Prescription drug management.   Iv ns. Continuous pulse ox and cardiac monitoring. Imaging ordered.   Differential diagnosis includes DDD, scoliosis, ureteral stone, compression fracture, AAA, etc. Dispo decision including potential need for admission considered - will get imaging and reassess.   Reviewed nursing notes and prior charts for additional history. External reports reviewed. Additional history from: EMS.   Cardiac monitor: sinus rhythm, rate 68.  CT reviewed/interpreted by me - no acute abd process. Lumbar compression fx c/w prior imaging.   Room air pulse ox 88%. Bp is very high, 200s over 110-120.  Pt indicates had her bp med this AM. Labetalol iv.   Cxr reviewed/interpreted by me - vascular congestion.   Additional labs reviewed/interpreted by me - bnp v high. Given uncontrolled htn, chf, mild hypoxia, o2 ns, lasix iv.   Will consult hospitalists for admission.      {Document critical care time when appropriate:1} {Document review of labs and clinical decision tools ie heart score, Chads2Vasc2 etc:1}  {Document your independent review of radiology images, and any outside records:1} {Document your discussion with family members, caretakers, and with consultants:1} {Document social determinants of health  affecting pt's care:1} {Document your decision making why or why not admission, treatments were needed:1} Final Clinical Impression(s) / ED Diagnoses Final diagnoses:  None    Rx / DC Orders ED Discharge Orders     None

## 2022-09-04 NOTE — ED Triage Notes (Signed)
Patient arrives to ED via [facility], resides at [facility] in [place]. Alert and oriented x 4. c/o lumbar pain that radiates to groin and bilateral legs. Patient denies falling. No acute distress noted.

## 2022-09-04 NOTE — ED Notes (Signed)
No fecal impaction, small amount of soft stool felt . Patient has external hemorrhoids, No bleeding noted. Bladder scan 296 ml. DNR bracelet placed on patients right arm

## 2022-09-04 NOTE — ED Notes (Signed)
External Cath placed due to immobility within patients range of motion and patient becoming SOB. Admit for lower back pain. Instructed to do so by [employer] [place]

## 2022-09-04 NOTE — ED Notes (Signed)
Blood pressure elevated EDP notified.

## 2022-09-04 NOTE — ED Notes (Signed)
Spoke with [employer] [facility] [place], updates given. Family at bedside.

## 2022-09-05 ENCOUNTER — Observation Stay (HOSPITAL_COMMUNITY): Payer: Medicare Other

## 2022-09-05 DIAGNOSIS — E039 Hypothyroidism, unspecified: Secondary | ICD-10-CM | POA: Diagnosis present

## 2022-09-05 DIAGNOSIS — E785 Hyperlipidemia, unspecified: Secondary | ICD-10-CM | POA: Diagnosis present

## 2022-09-05 DIAGNOSIS — M8008XD Age-related osteoporosis with current pathological fracture, vertebra(e), subsequent encounter for fracture with routine healing: Secondary | ICD-10-CM | POA: Diagnosis not present

## 2022-09-05 DIAGNOSIS — I4430 Unspecified atrioventricular block: Secondary | ICD-10-CM | POA: Diagnosis present

## 2022-09-05 DIAGNOSIS — I5031 Acute diastolic (congestive) heart failure: Secondary | ICD-10-CM

## 2022-09-05 DIAGNOSIS — Z66 Do not resuscitate: Secondary | ICD-10-CM | POA: Diagnosis present

## 2022-09-05 DIAGNOSIS — Z886 Allergy status to analgesic agent status: Secondary | ICD-10-CM | POA: Diagnosis not present

## 2022-09-05 DIAGNOSIS — R109 Unspecified abdominal pain: Secondary | ICD-10-CM | POA: Diagnosis present

## 2022-09-05 DIAGNOSIS — E876 Hypokalemia: Secondary | ICD-10-CM | POA: Diagnosis present

## 2022-09-05 DIAGNOSIS — D72829 Elevated white blood cell count, unspecified: Secondary | ICD-10-CM | POA: Diagnosis present

## 2022-09-05 DIAGNOSIS — I5043 Acute on chronic combined systolic (congestive) and diastolic (congestive) heart failure: Secondary | ICD-10-CM | POA: Diagnosis present

## 2022-09-05 DIAGNOSIS — R001 Bradycardia, unspecified: Secondary | ICD-10-CM | POA: Diagnosis present

## 2022-09-05 DIAGNOSIS — R0902 Hypoxemia: Secondary | ICD-10-CM | POA: Diagnosis present

## 2022-09-05 DIAGNOSIS — F32A Depression, unspecified: Secondary | ICD-10-CM | POA: Diagnosis present

## 2022-09-05 DIAGNOSIS — N182 Chronic kidney disease, stage 2 (mild): Secondary | ICD-10-CM | POA: Diagnosis present

## 2022-09-05 DIAGNOSIS — I130 Hypertensive heart and chronic kidney disease with heart failure and stage 1 through stage 4 chronic kidney disease, or unspecified chronic kidney disease: Secondary | ICD-10-CM | POA: Diagnosis present

## 2022-09-05 DIAGNOSIS — Z79899 Other long term (current) drug therapy: Secondary | ICD-10-CM | POA: Diagnosis not present

## 2022-09-05 DIAGNOSIS — I509 Heart failure, unspecified: Secondary | ICD-10-CM | POA: Diagnosis not present

## 2022-09-05 DIAGNOSIS — Z1152 Encounter for screening for COVID-19: Secondary | ICD-10-CM | POA: Diagnosis not present

## 2022-09-05 DIAGNOSIS — R269 Unspecified abnormalities of gait and mobility: Secondary | ICD-10-CM | POA: Diagnosis present

## 2022-09-05 DIAGNOSIS — Z825 Family history of asthma and other chronic lower respiratory diseases: Secondary | ICD-10-CM | POA: Diagnosis not present

## 2022-09-05 DIAGNOSIS — R296 Repeated falls: Secondary | ICD-10-CM | POA: Diagnosis present

## 2022-09-05 LAB — ECHOCARDIOGRAM COMPLETE
Area-P 1/2: 2.16 cm2
Calc EF: 49.4 %
Height: 64 in
MV M vel: 4.79 m/s
MV Peak grad: 91.8 mmHg
Radius: 0.4 cm
S' Lateral: 3.1 cm
Single Plane A2C EF: 44.2 %
Single Plane A4C EF: 47.5 %
Weight: 2077.62 oz

## 2022-09-05 LAB — CBC
HCT: 38.8 % (ref 36.0–46.0)
Hemoglobin: 12.9 g/dL (ref 12.0–15.0)
MCH: 32.9 pg (ref 26.0–34.0)
MCHC: 33.2 g/dL (ref 30.0–36.0)
MCV: 99 fL (ref 80.0–100.0)
Platelets: 202 10*3/uL (ref 150–400)
RBC: 3.92 MIL/uL (ref 3.87–5.11)
RDW: 13.2 % (ref 11.5–15.5)
WBC: 8.9 10*3/uL (ref 4.0–10.5)
nRBC: 0 % (ref 0.0–0.2)

## 2022-09-05 LAB — BASIC METABOLIC PANEL
Anion gap: 11 (ref 5–15)
BUN: 26 mg/dL — ABNORMAL HIGH (ref 8–23)
CO2: 29 mmol/L (ref 22–32)
Calcium: 8.6 mg/dL — ABNORMAL LOW (ref 8.9–10.3)
Chloride: 97 mmol/L — ABNORMAL LOW (ref 98–111)
Creatinine, Ser: 1.22 mg/dL — ABNORMAL HIGH (ref 0.44–1.00)
GFR, Estimated: 40 mL/min — ABNORMAL LOW (ref >60)
Glucose, Bld: 89 mg/dL (ref 70–99)
Potassium: 2.9 mmol/L — ABNORMAL LOW (ref 3.5–5.1)
Sodium: 137 mmol/L (ref 135–145)

## 2022-09-05 LAB — MRSA NEXT GEN BY PCR, NASAL: MRSA by PCR Next Gen: NOT DETECTED

## 2022-09-05 LAB — MAGNESIUM: Magnesium: 2.1 mg/dL (ref 1.7–2.4)

## 2022-09-05 MED ORDER — POTASSIUM CHLORIDE 10 MEQ/100ML IV SOLN
10.0000 meq | INTRAVENOUS | Status: AC
  Administered 2022-09-05 (×4): 10 meq via INTRAVENOUS
  Filled 2022-09-05 (×3): qty 100

## 2022-09-05 MED ORDER — FUROSEMIDE 10 MG/ML IJ SOLN
40.0000 mg | Freq: Once | INTRAMUSCULAR | Status: AC
  Administered 2022-09-05: 40 mg via INTRAVENOUS
  Filled 2022-09-05: qty 4

## 2022-09-05 MED ORDER — POTASSIUM CHLORIDE CRYS ER 20 MEQ PO TBCR
40.0000 meq | EXTENDED_RELEASE_TABLET | Freq: Two times a day (BID) | ORAL | Status: AC
  Administered 2022-09-05 (×2): 40 meq via ORAL
  Filled 2022-09-05 (×2): qty 2

## 2022-09-05 NOTE — Plan of Care (Signed)
  Problem: Acute Rehab PT Goals(only PT should resolve) Goal: Pt Will Go Supine/Side To Sit Flowsheets (Taken 09/05/2022 1547) Pt will go Supine/Side to Sit: with modified independence Goal: Patient Will Transfer Sit To/From Stand Flowsheets (Taken 09/05/2022 1547) Patient will transfer sit to/from stand:  with min guard assist  from elevated surface Goal: Pt Will Transfer Bed To Chair/Chair To Bed Flowsheets (Taken 09/05/2022 1547) Pt will Transfer Bed to Chair/Chair to Bed: min guard assist Goal: Pt Will Ambulate Flowsheets (Taken 09/05/2022 1547) Pt will Ambulate:  25 feet  with min guard assist  with rolling walker  3:48 PM, 09/05/22 Georges Lynch PT DPT  Physical Therapist with [facility]  [phone]

## 2022-09-05 NOTE — Evaluation (Signed)
Physical Therapy Evaluation Patient Details Name: Catherine West MRN: [id number] DOB: Nov 19, 1920 Today's Date: 09/05/2022  History of Present Illness  Catherine West is a 89 y.o. female with medical history significant for hypertension, depression, and osteoporosis who presented to the ED with complaints of low back pain with radiation to her groin and bilateral legs.  She was noted to have significant blood pressure elevation in the ED as well as some mild hypoxemia.  She denies any chest pain, fevers, chills, or shortness of breath.  Denies any lower extremity edema.   Clinical Impression  Patient presents supine in bed, with granddaughter at bedside. Patient is awake, alert and cooperative. Patient and family stating they do not think independent living after this point is reasonable, so they are considering assisted living placement. Patient requires mod A for bed mobility, and is able to stand briefly at bedside with assist. Patient unsteady in standing and requires RW and min guard for balance. Patient fatigues quickly and returns to bed. Patient requires Mod A for transfer and positioning in bed. Patient left in bed, with HOB elevated, phone and call bell in reach, bed alarm set and family members present in room. Patient will benefit from continued physical therapy in hospital and recommended venue below to increase strength, balance, endurance for safe ADLs and gait.        Recommendations for follow up therapy are one component of a multi-disciplinary discharge planning process, led by the attending physician.  Recommendations may be updated based on patient status, additional functional criteria and insurance authorization.  Follow Up Recommendations Can patient physically be transported by private vehicle: Yes     Assistance Recommended at Discharge Frequent or constant Supervision/Assistance  Patient can return home with the following  A lot of help with walking and/or transfers;A  little help with bathing/dressing/bathroom;Help with stairs or ramp for entrance    Equipment Recommendations None recommended by PT  Recommendations for Other Services  OT consult    Functional Status Assessment Patient has had a recent decline in their functional status and demonstrates the ability to make significant improvements in function in a reasonable and predictable amount of time.     Precautions / Restrictions Precautions Precautions: Fall Restrictions Weight Bearing Restrictions: No      Mobility  Bed Mobility Overal bed mobility: Needs Assistance Bed Mobility: Supine to Sit, Sit to Supine     Supine to sit: Mod assist Sit to supine: Mod assist   General bed mobility comments: slow, labored movement, noting low back pain with movement    Transfers Overall transfer level: Needs assistance Equipment used: Rolling walker (2 wheels) Transfers: Sit to/from Stand Sit to Stand: Mod assist           General transfer comment: slow, labored, using RW at bedside    Ambulation/Gait                  Stairs            Wheelchair Mobility    Modified Rankin (Stroke Patients Only)       Balance Overall balance assessment: Needs assistance Sitting-balance support: Feet supported, Bilateral upper extremity supported Sitting balance-Leahy Scale: Poor Sitting balance - Comments: leans posteriorly, needs assist to stay balanced seated at EOB Postural control: Posterior lean Standing balance support: Bilateral upper extremity supported, Reliant on assistive device for balance Standing balance-Leahy Scale: Poor Standing balance comment: needs RW and min gaurd for balance standing at bedside  Pertinent Vitals/Pain Pain Assessment Pain Assessment: No/denies pain    Home Living Family/patient expects to be discharged to:: Assisted living [facility])                 Home Equipment: Wheelchair -  Forensic psychologist (2 wheels);Cane - single point      Prior Function Prior Level of Function : Independent/Modified Independent                     Hand Dominance        Extremity/Trunk Assessment   Upper Extremity Assessment Upper Extremity Assessment: Defer to OT evaluation    Lower Extremity Assessment Lower Extremity Assessment: Generalized weakness    Cervical / Trunk Assessment Cervical / Trunk Assessment: Kyphotic  Communication   Communication: No difficulties  Cognition Arousal/Alertness: Awake/alert Behavior During Therapy: WFL for tasks assessed/performed Overall Cognitive Status: Within Functional Limits for tasks assessed                                          General Comments      Exercises     Assessment/Plan    PT Assessment Patient needs continued PT services  PT Problem List Decreased strength;Decreased activity tolerance;Decreased balance;Decreased mobility       PT Treatment Interventions DME instruction;Therapeutic exercise;Gait training;Balance training;Therapeutic activities;Patient/family education;Functional mobility training;Neuromuscular re-education    PT Goals (Current goals can be found in the Care Plan section)  Acute Rehab PT Goals Patient Stated Goal: Go to assisted living PT Goal Formulation: With patient/family Time For Goal Achievement: 09/19/22 Potential to Achieve Goals: Good    Frequency Min 3X/week     Co-evaluation               AM-PAC PT "6 Clicks" Mobility  Outcome Measure Help needed turning from your back to your side while in a flat bed without using bedrails?: A Little Help needed moving from lying on your back to sitting on the side of a flat bed without using bedrails?: A Little Help needed moving to and from a bed to a chair (including a wheelchair)?: A Little Help needed standing up from a chair using your arms (e.g., wheelchair or bedside chair)?: A Little Help  needed to walk in hospital room?: A Lot Help needed climbing 3-5 steps with a railing? : Total 6 Click Score: 15    End of Session Equipment Utilized During Treatment: Gait belt Activity Tolerance: Patient limited by fatigue;Patient limited by pain Patient left: in bed;with family/visitor present;with call bell/phone within reach;with bed alarm set Nurse Communication: Mobility status PT Visit Diagnosis: Unsteadiness on feet (R26.81);Other abnormalities of gait and mobility (R26.89);Muscle weakness (generalized) (M62.81);Difficulty in walking, not elsewhere classified (R26.2)    Time: 4540-9811 PT Time Calculation (min) (ACUTE ONLY): 21 min   Charges:   PT Evaluation $PT Eval Low Complexity: 1 Low PT Treatments $Therapeutic Exercise: 8-22 mins      3:46 PM, 09/05/22 Georges Lynch PT DPT  Physical Therapist with [facility]  [phone]

## 2022-09-05 NOTE — Progress Notes (Addendum)
PROGRESS NOTE    Catherine West  RUE:[id number] DOB: 30-Jun-1920 DOA: 09/04/2022 PCP: Benita Stabile, MD   Brief Narrative:    Catherine West is a 89 y.o. female with medical history significant for hypertension, depression, and osteoporosis who presented to the ED with complaints of low back pain with radiation to her groin and bilateral legs.  She was noted to have significant blood pressure elevation in the ED as well as some mild hypoxemia.  She was admitted for hypertension control as well as diuresis and evaluation for CHF.  Assessment & Plan:   Principal Problem:   Acute CHF (HCC) Active Problems:   Gait abnormality   Falls frequently   Essential (primary) hypertension   Hypothyroidism, unspecified   Osteoporosis  Assessment and Plan:   Uncontrolled hypertension with concern for acute CHF BNP elevation noted Ordered 2D echocardiogram which is pending Administer IV Lasix Continue home blood pressure medications Check Reds clip   Low back pain Continue pain management PT evaluation   Depression Continue home medications  Hypokalemia In the setting of Lasix use Replete and monitor carefully with repeat BMP in a.m.   Osteoporosis    DVT prophylaxis:Heparin Code Status: Full Family Communication: Daughter, Pam at bedside 5/5 Disposition Plan:  Status is: Observation The patient will require care spanning > 2 midnights and should be moved to inpatient because: Need for IV medications.   Consultants:  None  Procedures:  None  Antimicrobials:  None   Subjective: Patient seen and evaluated today with no new acute complaints or concerns. No acute concerns or events noted overnight.  Objective: Vitals:   09/04/22 2145 09/05/22 0144 09/05/22 0530 09/05/22 0755  BP: (!) 160/67 (!) 154/70 (!) 177/68 (!) 190/87  Pulse: (!) 56 (!) 55 (!) 52 (!) 51  Resp:      Temp: 98.5 F (36.9 C) 98 F (36.7 C) 98.4 F (36.9 C)   TempSrc: Oral Oral Oral   SpO2:  92% 92% 97%   Weight:   58.9 kg   Height:        Intake/Output Summary (Last 24 hours) at 09/05/2022 1100 Last data filed at 09/05/2022 0900 Gross per 24 hour  Intake 480 ml  Output 400 ml  Net 80 ml   Filed Weights   09/04/22 1516 09/04/22 1824 09/05/22 0530  Weight: 64.9 kg 60 kg 58.9 kg    Examination:  General exam: Appears calm and comfortable  Respiratory system: Clear to auscultation. Respiratory effort normal.  2 L nasal cannula Cardiovascular system: S1 & S2 heard, RRR.  Gastrointestinal system: Abdomen is soft Central nervous system: Alert and awake Extremities: No edema Skin: No significant lesions noted Psychiatry: Flat affect.    Data Reviewed: I have personally reviewed following labs and imaging studies  CBC: Recent Labs  Lab 09/04/22 1345 09/05/22 0443  WBC 11.9* 8.9  HGB 14.3 12.9  HCT 42.4 38.8  MCV 97.2 99.0  PLT 214 202   Basic Metabolic Panel: Recent Labs  Lab 09/04/22 1345 09/05/22 0443  NA 135 137  K 3.1* 2.9*  CL 97* 97*  CO2 28 29  GLUCOSE 137* 89  BUN 23 26*  CREATININE 1.15* 1.22*  CALCIUM 8.8* 8.6*  MG  --  2.1   GFR: Estimated Creatinine Clearance: 21.2 mL/min (A) (by C-G formula based on SCr of 1.22 mg/dL (H)). Liver Function Tests: No results for input(s): "AST", "ALT", "ALKPHOS", "BILITOT", "PROT", "ALBUMIN" in the last 168 hours. No results for input(s): "  LIPASE", "AMYLASE" in the last 168 hours. No results for input(s): "AMMONIA" in the last 168 hours. Coagulation Profile: No results for input(s): "INR", "PROTIME" in the last 168 hours. Cardiac Enzymes: No results for input(s): "CKTOTAL", "CKMB", "CKMBINDEX", "TROPONINI" in the last 168 hours. BNP (last 3 results) No results for input(s): "PROBNP" in the last 8760 hours. HbA1C: No results for input(s): "HGBA1C" in the last 72 hours. CBG: No results for input(s): "GLUCAP" in the last 168 hours. Lipid Profile: No results for input(s): "CHOL", "HDL", "LDLCALC",  "TRIG", "CHOLHDL", "LDLDIRECT" in the last 72 hours. Thyroid Function Tests: No results for input(s): "TSH", "T4TOTAL", "FREET4", "T3FREE", "THYROIDAB" in the last 72 hours. Anemia Panel: No results for input(s): "VITAMINB12", "FOLATE", "FERRITIN", "TIBC", "IRON", "RETICCTPCT" in the last 72 hours. Sepsis Labs: No results for input(s): "PROCALCITON", "LATICACIDVEN" in the last 168 hours.  Recent Results (from the past 240 hour(s))  SARS Coronavirus 2 by RT PCR (hospital order, performed in [facility] hospital lab) *cepheid single result test* Anterior Nasal Swab     Status: None   Collection Time: 09/04/22  1:15 PM   Specimen: Anterior Nasal Swab  Result Value Ref Range Status   SARS Coronavirus 2 by RT PCR NEGATIVE NEGATIVE Final    Comment: (NOTE) SARS-CoV-2 target nucleic acids are NOT DETECTED.  The SARS-CoV-2 RNA is generally detectable in upper and lower respiratory specimens during the acute phase of infection. The lowest concentration of SARS-CoV-2 viral copies this assay can detect is 250 copies / mL. A negative result does not preclude SARS-CoV-2 infection and should not be used as the sole basis for treatment or other patient management decisions.  A negative result may occur with improper specimen collection / handling, submission of specimen other than nasopharyngeal swab, presence of viral mutation(s) within the areas targeted by this assay, and inadequate number of viral copies (<250 copies / mL). A negative result must be combined with clinical observations, patient history, and epidemiological information.  Fact Sheet for Patients:   RoadLapTop.co.za  Fact Sheet for Healthcare Providers: http://kim-miller.com/  This test is not yet approved or  cleared by the [place] FDA and has been authorized for detection and/or diagnosis of SARS-CoV-2 by FDA under an Emergency Use Authorization (EUA).  This EUA will  remain in effect (meaning this test can be used) for the duration of the COVID-19 declaration under Section 564(b)(1) of the Act, 21 U.S.C. section 360bbb-3(b)(1), unless the authorization is terminated or revoked sooner.  Performed at [facility], [address], [place], [place] [address]   MRSA Next Gen by PCR, Nasal     Status: None   Collection Time: 09/04/22  6:43 PM   Specimen: Nasal Mucosa; Nasal Swab  Result Value Ref Range Status   MRSA by PCR Next Gen NOT DETECTED NOT DETECTED Final    Comment: (NOTE) The GeneXpert MRSA Assay (FDA approved for NASAL specimens only), is one component of a comprehensive MRSA colonization surveillance program. It is not intended to diagnose MRSA infection nor to guide or monitor treatment for MRSA infections. Test performance is not FDA approved in patients less than 64 years old. Performed at [facility], [address] [place], [place] [address]          Radiology Studies: [facility] Chest [facility] 1 View  Result Date: 09/04/2022 CLINICAL DATA:  Shortness of breath. EXAM: PORTABLE CHEST 1 VIEW COMPARISON:  02/22/2018 FINDINGS: Heart size remains within normal limits. Ectasia and atherosclerotic calcification of thoracic aorta again noted.  Diffuse interstitial infiltrates are seen which are new. Tiny bilateral pleural effusions are also seen. No evidence of pulmonary consolidation. IMPRESSION: New diffuse interstitial infiltrates, which may be due to interstitial edema or pneumonitis. Tiny bilateral pleural effusions. Electronically Signed   By: Danae Orleans M.D.   On: 09/04/2022 13:27   CT Renal Stone Study  Result Date: 09/04/2022 CLINICAL DATA:  Abdominal/flank pain EXAM: CT ABDOMEN AND PELVIS WITHOUT CONTRAST TECHNIQUE: Multidetector CT imaging of the abdomen and pelvis was performed following the standard protocol without IV contrast. RADIATION DOSE REDUCTION: This exam was performed according to the departmental dose-optimization program which  includes automated exposure control, adjustment of the mA and/or kV according to patient size and/or use of iterative reconstruction technique. COMPARISON:  Lumbar spine radiograph dated August 23, 2022 FINDINGS: Lower chest: Cardiomegaly. Small right and trace left pleural effusions. Small hiatal hernia. Bilateral bronchiectasis, small nodules and more confluent areas of consolidation. Hepatobiliary: No focal liver abnormality is seen. No gallstones, gallbladder wall thickening, or biliary dilatation. Pancreas: Unremarkable. No pancreatic ductal dilatation or surrounding inflammatory changes. Spleen: Normal in size without focal abnormality. Adrenals/Urinary Tract: Small low-attenuation left adrenal gland nodule measuring 1.2 cm on series 2, image 14, likely benign adenoma with no specific follow-up imaging recommended. Hydronephrosis. Punctate bilateral nonobstructing stones of the bilateral upper poles. Bladder is unremarkable. Stomach/Bowel: Stomach is within normal limits. Mild diverticulosis. No evidence of bowel wall thickening, distention, or inflammatory changes. Vascular/Lymphatic: Aortic atherosclerosis. No enlarged abdominal or pelvic lymph nodes. Reproductive: Uterus and bilateral adnexa are unremarkable. Other: No abdominal wall hernia or abnormality. No abdominopelvic ascites. Musculoskeletal: Moderate compression deformity of the inferior endplate of L1, unchanged when compared with prior lumbar spine radiograph. Dextrocurvature of the lumbar spine. Acute osseous abnormality. IMPRESSION: 1. No acute findings in the abdomen or pelvis, including no evidence of obstructive uropathy. 2. Small right and trace left pleural effusions. 3. Moderate compression deformity of the inferior endplate of L1, unchanged when compared with prior lumbar spine radiograph. 4. Bilateral bronchiectasis and small nodules with more confluent areas of consolidation, findings are likely due to chronic atypical infection.  Consider dedicated chest CT for complete evaluation. 5. Aortic Atherosclerosis (ICD10-I70.0). Electronically Signed   By: Allegra Lai M.D.   On: 09/04/2022 12:50        Scheduled Meds:  heparin  5,000 Units Subcutaneous Q8H   losartan  100 mg Oral Daily   metoprolol succinate  100 mg Oral Daily   potassium chloride  40 mEq Oral BID   sertraline  100 mg Oral Daily   Continuous Infusions:  potassium chloride 10 mEq (09/05/22 0939)     LOS: 0 days    Time spent: 35 minutes    Mikaylee Arseneau Hoover Brunette, DO [facility]  If 7PM-7AM, please contact night-coverage [email or web address] 09/05/2022, 11:00 AM

## 2022-09-05 NOTE — TOC Initial Note (Signed)
Transition of Care [facility]) - Initial/Assessment Note    Patient Details  Name: Catherine West MRN: [id number] Date of Birth: [birth year]  Transition of Care (TOC) CM/SW Contact:    Catalina Gravel, LCSW Phone Number: 09/05/2022, 3:53 PM  Clinical Narrative:                 Pt arrived To ER by EMS from [facility]. Pt waiting echo. Pt continuing medical work up. TOC to follow.     Barriers to Discharge: Continued Medical Work up   Patient Goals and CMS Choice            Expected Discharge Plan and Services                                              Prior Living Arrangements/Services                       Activities of Daily Living Home Assistive Devices/Equipment: Cane (specify quad or straight), Wheelchair, Environmental consultant (specify type) (front wheel waker and four point cane) ADL Screening (condition at time of admission) Patient's cognitive ability adequate to safely complete daily activities?: Yes Is the patient deaf or have difficulty hearing?: No Does the patient have difficulty seeing, even when wearing glasses/contacts?: Yes Does the patient have difficulty concentrating, remembering, or making decisions?: No Patient able to express need for assistance with ADLs?: Yes Does the patient have difficulty dressing or bathing?: Yes Independently performs ADLs?: Yes (appropriate for developmental age) Communication: Independent Is this a change from baseline?: Pre-admission baseline Dressing (OT): Needs assistance Is this a change from baseline?: Pre-admission baseline Grooming: Needs assistance Is this a change from baseline?: Change from baseline, expected to last >3 days Feeding: Independent Bathing: Needs assistance Is this a change from baseline?: Change from baseline, expected to last <3 days Toileting: Needs assistance Is this a change from baseline?: Pre-admission baseline In/Out Bed: Needs assistance Is this a change from baseline?:  Pre-admission baseline Walks in Home: Needs assistance Is this a change from baseline?: Pre-admission baseline Does the patient have difficulty walking or climbing stairs?: Yes (Pt in with back pain) Weakness of Legs: Both Weakness of Arms/Hands: None (r/t back pain)  Permission Sought/Granted                  Emotional Assessment              Admission diagnosis:  Hypoxia [R09.02] Acute CHF (HCC) [I50.9] Uncontrolled hypertension [I10] Acute systolic CHF (congestive heart failure) (HCC) [I50.21] Lumbar compression fracture, closed, initial encounter (HCC) [S32.000A] Bilateral low back pain without sciatica, unspecified chronicity [M54.50] Patient Active Problem List   Diagnosis Date Noted   Acute CHF (HCC) 09/04/2022   Essential (primary) hypertension    Hyperlipidemia, unspecified    Major depressive disorder, single episode, unspecified    Chronic kidney disease, stage 2 (mild)    Chronic kidney disease, unspecified    Cough    Hypertensive chronic kidney disease with stage 1 through stage 4 chronic kidney disease, or unspecified chronic kidney disease    Hypothyroidism, unspecified    Osteoporosis    Gait abnormality 02/22/2019   Falls frequently 02/22/2019   COLLES' FRACTURE, RIGHT 04/15/2009   PCP:  Benita Stabile, MD Pharmacy:   [facility] - [place], [place] - [address] [place] [address] Phone: [phone] Fax: [phone]     Social Determinants of Health (SDOH) Social History: SDOH Screenings   Food Insecurity: No Food Insecurity (09/05/2022)  Housing: Low Risk  (09/05/2022)  Transportation Needs: No Transportation Needs (09/05/2022)  Utilities: Not At Risk (09/05/2022)  Tobacco Use: Low Risk  (09/04/2022)   SDOH Interventions:     Readmission Risk Interventions     No data to display

## 2022-09-06 DIAGNOSIS — I509 Heart failure, unspecified: Secondary | ICD-10-CM | POA: Diagnosis not present

## 2022-09-06 LAB — BASIC METABOLIC PANEL
Anion gap: 8 (ref 5–15)
BUN: 32 mg/dL — ABNORMAL HIGH (ref 8–23)
CO2: 29 mmol/L (ref 22–32)
Calcium: 8.7 mg/dL — ABNORMAL LOW (ref 8.9–10.3)
Chloride: 100 mmol/L (ref 98–111)
Creatinine, Ser: 1.31 mg/dL — ABNORMAL HIGH (ref 0.44–1.00)
GFR, Estimated: 36 mL/min — ABNORMAL LOW (ref >60)
Glucose, Bld: 91 mg/dL (ref 70–99)
Potassium: 4.2 mmol/L (ref 3.5–5.1)
Sodium: 137 mmol/L (ref 135–145)

## 2022-09-06 LAB — MAGNESIUM: Magnesium: 2.1 mg/dL (ref 1.7–2.4)

## 2022-09-06 MED ORDER — HYDRALAZINE HCL 20 MG/ML IJ SOLN: 10.0000 mg | INTRAMUSCULAR | Status: DC | PRN

## 2022-09-06 MED ORDER — ORAL CARE MOUTH RINSE: 15.0000 mL | OROMUCOSAL | Status: DC | PRN

## 2022-09-06 MED ORDER — CARVEDILOL 3.125 MG PO TABS
6.2500 mg | ORAL_TABLET | Freq: Two times a day (BID) | ORAL | Status: DC
  Administered 2022-09-06: 6.25 mg via ORAL
  Filled 2022-09-06: qty 2

## 2022-09-06 MED ORDER — TRAMADOL HCL 50 MG PO TABS
50.0000 mg | ORAL_TABLET | Freq: Two times a day (BID) | ORAL | Status: DC | PRN
  Administered 2022-09-07: 50 mg via ORAL
  Filled 2022-09-06: qty 1

## 2022-09-06 MED ORDER — ACETAMINOPHEN 325 MG PO TABS
650.0000 mg | ORAL_TABLET | Freq: Four times a day (QID) | ORAL | Status: DC
  Administered 2022-09-06 – 2022-09-07 (×3): 650 mg via ORAL
  Filled 2022-09-06 (×3): qty 2

## 2022-09-06 NOTE — Progress Notes (Signed)
PROGRESS NOTE    Catherine West  UXL:[id number] DOB: March 03, 1921 DOA: 09/04/2022 PCP: Benita Stabile, MD   Brief Narrative:    Catherine West is a 89 y.o. female with medical history significant for hypertension, depression, and osteoporosis who presented to the ED with complaints of low back pain with radiation to her groin and bilateral legs.  She was noted to have significant blood pressure elevation in the ED as well as some mild hypoxemia.  She was admitted for hypertension control as well as diuresis and evaluation for CHF.  She continues to have some blood pressure elevations and is noted to have some bradycardia in the setting of metoprolol and Coreg use.  These have not been discontinued.  Assessment & Plan:   Principal Problem:   Acute CHF (HCC) Active Problems:   Gait abnormality   Falls frequently   Essential (primary) hypertension   Hypothyroidism, unspecified   Osteoporosis  Assessment and Plan:   Acute diastolic CHF exacerbation in the setting of uncontrolled hypertension BNP elevation noted 2D echocardiogram with LVEF 40-45% and grade 2 diastolic dysfunction Hold further diuresis with Lasix Continue home losartan and hold metoprolol on account of bradycardia Check Reds clip  Bradycardia with concern for primary AV block Discontinued AV nodal blockade agents   Low back pain Continue pain management PT evaluation recommending SNF and patient agreeable   Depression Continue home medications  Osteoporosis With moderate vertebral compression fracture noted on initial imaging Continue pain management as prescribed  DVT prophylaxis:Heparin Code Status: DNR Family Communication: Discussed with daughter-in-law Alona Bene 5/6 Disposition Plan:  Status is: Observation The patient will require care spanning > 2 midnights and should be moved to inpatient because: Need for IV medications.   Consultants:  None  Procedures:  None  Antimicrobials:   None   Subjective: Patient seen and evaluated today with no new acute complaints or concerns. No acute concerns or events noted overnight.  She continues to have ongoing widespread pain.  Objective: Vitals:   09/06/22 0410 09/06/22 0910 09/06/22 1040 09/06/22 1101  BP: (!) 174/82 (!) 177/85 (!) 160/61 (!) 149/62  Pulse: 99  (!) 53   Resp:   14   Temp: 98.5 F (36.9 C)  98.2 F (36.8 C)   TempSrc: Oral  Oral   SpO2: 94%  95%   Weight: 59 kg     Height:        Intake/Output Summary (Last 24 hours) at 09/06/2022 1347 Last data filed at 09/06/2022 5366 Gross per 24 hour  Intake 480 ml  Output 1450 ml  Net -970 ml   Filed Weights   09/04/22 1824 09/05/22 0530 09/06/22 0410  Weight: 60 kg 58.9 kg 59 kg    Examination:  General exam: Appears calm and comfortable  Respiratory system: Clear to auscultation. Respiratory effort normal.  Currently on room air. Cardiovascular system: S1 & S2 heard, RRR.  Gastrointestinal system: Abdomen is soft Central nervous system: Alert and awake Extremities: No edema Skin: No significant lesions noted Psychiatry: Flat affect.    Data Reviewed: I have personally reviewed following labs and imaging studies  CBC: Recent Labs  Lab 09/04/22 1345 09/05/22 0443  WBC 11.9* 8.9  HGB 14.3 12.9  HCT 42.4 38.8  MCV 97.2 99.0  PLT 214 202   Basic Metabolic Panel: Recent Labs  Lab 09/04/22 1345 09/05/22 0443 09/06/22 0516  NA 135 137 137  K 3.1* 2.9* 4.2  CL 97* 97* 100  CO2 28 29  29  GLUCOSE 137* 89 91  BUN 23 26* 32*  CREATININE 1.15* 1.22* 1.31*  CALCIUM 8.8* 8.6* 8.7*  MG  --  2.1 2.1   GFR: Estimated Creatinine Clearance: 19.7 mL/min (A) (by C-G formula based on SCr of 1.31 mg/dL (H)). Liver Function Tests: No results for input(s): "AST", "ALT", "ALKPHOS", "BILITOT", "PROT", "ALBUMIN" in the last 168 hours. No results for input(s): "LIPASE", "AMYLASE" in the last 168 hours. No results for input(s): "AMMONIA" in the last  168 hours. Coagulation Profile: No results for input(s): "INR", "PROTIME" in the last 168 hours. Cardiac Enzymes: No results for input(s): "CKTOTAL", "CKMB", "CKMBINDEX", "TROPONINI" in the last 168 hours. BNP (last 3 results) No results for input(s): "PROBNP" in the last 8760 hours. HbA1C: No results for input(s): "HGBA1C" in the last 72 hours. CBG: No results for input(s): "GLUCAP" in the last 168 hours. Lipid Profile: No results for input(s): "CHOL", "HDL", "LDLCALC", "TRIG", "CHOLHDL", "LDLDIRECT" in the last 72 hours. Thyroid Function Tests: No results for input(s): "TSH", "T4TOTAL", "FREET4", "T3FREE", "THYROIDAB" in the last 72 hours. Anemia Panel: No results for input(s): "VITAMINB12", "FOLATE", "FERRITIN", "TIBC", "IRON", "RETICCTPCT" in the last 72 hours. Sepsis Labs: No results for input(s): "PROCALCITON", "LATICACIDVEN" in the last 168 hours.  Recent Results (from the past 240 hour(s))  SARS Coronavirus 2 by RT PCR (hospital order, performed in [facility] hospital lab) *cepheid single result test* Anterior Nasal Swab     Status: None   Collection Time: 09/04/22  1:15 PM   Specimen: Anterior Nasal Swab  Result Value Ref Range Status   SARS Coronavirus 2 by RT PCR NEGATIVE NEGATIVE Final    Comment: (NOTE) SARS-CoV-2 target nucleic acids are NOT DETECTED.  The SARS-CoV-2 RNA is generally detectable in upper and lower respiratory specimens during the acute phase of infection. The lowest concentration of SARS-CoV-2 viral copies this assay can detect is 250 copies / mL. A negative result does not preclude SARS-CoV-2 infection and should not be used as the sole basis for treatment or other patient management decisions.  A negative result may occur with improper specimen collection / handling, submission of specimen other than nasopharyngeal swab, presence of viral mutation(s) within the areas targeted by this assay, and inadequate number of viral copies (<250 copies /  mL). A negative result must be combined with clinical observations, patient history, and epidemiological information.  Fact Sheet for Patients:   RoadLapTop.co.za  Fact Sheet for Healthcare Providers: http://kim-miller.com/  This test is not yet approved or  cleared by the [place] FDA and has been authorized for detection and/or diagnosis of SARS-CoV-2 by FDA under an Emergency Use Authorization (EUA).  This EUA will remain in effect (meaning this test can be used) for the duration of the COVID-19 declaration under Section 564(b)(1) of the Act, 21 U.S.C. section 360bbb-3(b)(1), unless the authorization is terminated or revoked sooner.  Performed at [facility], [address], [place], [place] [address]   MRSA Next Gen by PCR, Nasal     Status: None   Collection Time: 09/04/22  6:43 PM   Specimen: Nasal Mucosa; Nasal Swab  Result Value Ref Range Status   MRSA by PCR Next Gen NOT DETECTED NOT DETECTED Final    Comment: (NOTE) The GeneXpert MRSA Assay (FDA approved for NASAL specimens only), is one component of a comprehensive MRSA colonization surveillance program. It is not intended to diagnose MRSA infection nor to guide or monitor treatment for MRSA infections. Test performance is not FDA  approved in patients less than 46 years old. Performed at [facility], [address] [place], [place] [address]          Radiology Studies: ECHOCARDIOGRAM COMPLETE  Result Date: 09/05/2022    ECHOCARDIOGRAM REPORT   Patient Name:   MADRA PARRON Date of Exam: 09/05/2022 Medical Rec #:  [id number]        Height:       64.0 in Accession #:    [id number]       Weight:       129.9 lb Date of Birth:  09-13-1920         BSA:          1.628 m Patient Age:    89 years        BP:           190/87 mmHg Patient Gender: F                HR:           46 bpm. [facility] Procedure: 2D Echo, Color Doppler and Cardiac Doppler Indications:     I50.31 Acute diastolic (congestive) heart failure  History:        Patient has no prior history of Echocardiogram examinations.                 CHF; Risk Factors:Hypertension and Dyslipidemia.  Sonographer:    Irving Burton Senior RDCS Referring Phys: [phone] [facility]  Sonographer Comments: Suboptimal parasternal window IMPRESSIONS  1. Left ventricular ejection fraction, by estimation, is 40 to 45%. The left ventricle has mildly decreased function. The left ventricle demonstrates global hypokinesis. There is mild left ventricular hypertrophy. Left ventricular diastolic parameters are consistent with Grade II diastolic dysfunction (pseudonormalization). Elevated left ventricular end-diastolic pressure. The E/e' is 22.  2. Right ventricular systolic function is mildly reduced. The right ventricular size is normal. There is normal pulmonary artery systolic pressure. The estimated right ventricular systolic pressure is 20.1 mmHg.  3. Left atrial size was mildly dilated.  4. The mitral valve is abnormal. Mild mitral valve regurgitation.  5. The aortic valve is tricuspid. Aortic valve regurgitation is not visualized.  6. The inferior vena cava is normal in size with greater than 50% respiratory variability, suggesting right atrial pressure of 3 mmHg.  7. Rhythm strip during this exam demonstrates sinus bradycardia. Comparison(s): No prior Echocardiogram. FINDINGS  Left Ventricle: Left ventricular ejection fraction, by estimation, is 40 to 45%. The left ventricle has mildly decreased function. The left ventricle demonstrates global hypokinesis. The left ventricular internal cavity size was normal in size. There is  mild left ventricular hypertrophy. Left ventricular diastolic parameters are consistent with Grade II diastolic dysfunction (pseudonormalization). Elevated left ventricular end-diastolic pressure. The E/e' is 22. Right Ventricle: The right ventricular size is normal. No increase in right ventricular wall  thickness. Right ventricular systolic function is mildly reduced. There is normal pulmonary artery systolic pressure. The tricuspid regurgitant velocity is 2.07 m/s, and with an assumed right atrial pressure of 3 mmHg, the estimated right ventricular systolic pressure is 20.1 mmHg. Left Atrium: Left atrial size was mildly dilated. Right Atrium: Right atrial size was normal in size. Pericardium: There is no evidence of pericardial effusion. Mitral Valve: The mitral valve is abnormal. Mild mitral annular calcification. Mild mitral valve regurgitation. Tricuspid Valve: The tricuspid valve is grossly normal. Tricuspid valve regurgitation is trivial. Aortic Valve: The aortic valve is tricuspid. Aortic valve regurgitation is not  visualized. Pulmonic Valve: The pulmonic valve was normal in structure. Pulmonic valve regurgitation is not visualized. Aorta: The aortic root and ascending aorta are structurally normal, with no evidence of dilitation. Venous: The inferior vena cava is normal in size with greater than 50% respiratory variability, suggesting right atrial pressure of 3 mmHg. IAS/Shunts: No atrial level shunt detected by color flow Doppler. EKG: Rhythm strip during this exam demonstrates sinus bradycardia.  LEFT VENTRICLE PLAX 2D LVIDd:         4.50 cm      Diastology LVIDs:         3.10 cm      LV e' medial:    3.48 cm/s LV PW:         0.90 cm      LV E/e' medial:  23.3 LV IVS:        1.30 cm      LV e' lateral:   3.05 cm/s LVOT diam:     1.90 cm      LV E/e' lateral: 26.6 LV SV:         50 LV SV Index:   31 LVOT Area:     2.84 cm  LV Volumes (MOD) LV vol d, MOD A2C: 103.0 ml LV vol d, MOD A4C: 111.0 ml LV vol s, MOD A2C: 57.5 ml LV vol s, MOD A4C: 58.3 ml LV SV MOD A2C:     45.5 ml LV SV MOD A4C:     111.0 ml LV SV MOD BP:      58.0 ml RIGHT VENTRICLE RV S prime:     9.25 cm/s TAPSE (M-mode): 2.0 cm LEFT ATRIUM             Index        RIGHT ATRIUM           Index LA diam:        3.20 cm 1.97 cm/m   RA Area:      13.60 cm LA Vol (A2C):   58.1 ml 35.68 ml/m  RA Volume:   29.60 ml  18.18 ml/m LA Vol (A4C):   59.8 ml 36.73 ml/m LA Biplane Vol: 60.7 ml 37.28 ml/m  AORTIC VALVE LVOT Vmax:   86.20 cm/s LVOT Vmean:  57.400 cm/s LVOT VTI:    0.177 m  AORTA Ao Root diam: 2.85 cm Ao Asc diam:  3.10 cm MITRAL VALVE                  TRICUSPID VALVE MV Area (PHT): 2.16 cm       TR Peak grad:   17.1 mmHg MV Decel Time: 351 msec       TR Vmax:        207.00 cm/s MR Peak grad:    91.8 mmHg MR Mean grad:    36.0 mmHg    SHUNTS MR Vmax:         479.00 cm/s  Systemic VTI:  0.18 m MR Vmean:        269.0 cm/s   Systemic Diam: 1.90 cm MR PISA:         1.01 cm MR PISA Eff ROA: 8 mm MR PISA Radius:  0.40 cm MV E velocity: 81.00 cm/s MV A velocity: 72.80 cm/s MV E/A ratio:  1.11 Zoila Shutter MD Electronically signed by Zoila Shutter MD Signature Date/Time: 09/05/2022/11:22:58 AM    Final         Scheduled Meds:  acetaminophen  650 mg Oral Q6H  heparin  5,000 Units Subcutaneous Q8H   losartan  100 mg Oral Daily   sertraline  100 mg Oral Daily     LOS: 1 day    Time spent: 35 minutes    Salwa Bai Hoover Brunette, DO [facility]  If 7PM-7AM, please contact night-coverage [email or web address] 09/06/2022, 1:47 PM

## 2022-09-06 NOTE — Progress Notes (Signed)
Tele called regarding patients heart rate dropping in the 30s then goes between the 40-50s. Patients heart rate at this time is 40. Patient verbalized no complaints family at bedside. Received at report that patients heart rate runs between the 30s-50s. MD Sherryll Burger made aware. New orders placed. EKG ordered and completed, placed in chart.

## 2022-09-06 NOTE — NC FL2 (Signed)
[place] MEDICAID FL2 LEVEL OF CARE FORM     IDENTIFICATION  Patient Name: COUNTNEY LEXA Birthdate: 06/10/1920 Sex: female Admission Date (Current Location): 09/04/2022  [facility] and [place] Number:  [name] and Address:  [facility],  [address] [place] [address]      Provider Number: [id number]  Attending Physician Name and Address:  Erick Blinks, DO  Relative Name and Phone Number:       Current Level of Care: Hospital Recommended Level of Care: Skilled Nursing Facility Prior Approval Number:    Date Approved/Denied:   PASRR Number: [id number]  Discharge Plan: SNF    Current Diagnoses: Patient Active Problem List   Diagnosis Date Noted   Acute CHF (HCC) 09/04/2022   Essential (primary) hypertension    Hyperlipidemia, unspecified    Major depressive disorder, single episode, unspecified    Chronic kidney disease, stage 2 (mild)    Chronic kidney disease, unspecified    Cough    Hypertensive chronic kidney disease with stage 1 through stage 4 chronic kidney disease, or unspecified chronic kidney disease    Hypothyroidism, unspecified    Osteoporosis    Gait abnormality 02/22/2019   Falls frequently 02/22/2019   COLLES' FRACTURE, RIGHT 04/15/2009    Orientation RESPIRATION BLADDER Height & Weight     Self, Time, Situation, Place  Normal Incontinent Weight: 130 lb 1.1 oz (59 kg) Height:  5\' 4"  (162.6 cm)  BEHAVIORAL SYMPTOMS/MOOD NEUROLOGICAL BOWEL NUTRITION STATUS      Incontinent Diet (see dc summary)  AMBULATORY STATUS COMMUNICATION OF NEEDS Skin   Extensive Assist Verbally Normal                       Personal Care Assistance Level of Assistance  Bathing, Feeding, Dressing Bathing Assistance: Limited assistance Feeding assistance: Limited assistance Dressing Assistance: Limited assistance     Functional Limitations Info  Sight, Hearing, Speech Sight Info: Adequate Hearing Info: Impaired Speech Info:  Adequate    SPECIAL CARE FACTORS FREQUENCY  PT (By licensed PT), OT (By licensed OT)     PT Frequency: 5x week OT Frequency: 3x week            Contractures Contractures Info: Not present    Additional Factors Info  Code Status, Allergies Code Status Info: DNR Allergies Info: Aspirin           Current Medications (09/06/2022):  This is the current hospital active medication list Current Facility-Administered Medications  Medication Dose Route Frequency Provider Last Rate Last Admin   acetaminophen (TYLENOL) tablet 650 mg  650 mg Oral Q6H PRN Sherryll Burger, Pratik D, DO       Or   acetaminophen (TYLENOL) suppository 650 mg  650 mg Rectal Q6H PRN Sherryll Burger, Pratik D, DO       heparin injection 5,000 Units  5,000 Units Subcutaneous Q8H Shah, Pratik D, DO   5,000 Units at 09/06/22 0606   losartan (COZAAR) tablet 100 mg  100 mg Oral Daily Sherryll Burger, Pratik D, DO   100 mg at 09/06/22 0914   ondansetron (ZOFRAN) tablet 4 mg  4 mg Oral Q6H PRN Sherryll Burger, Pratik D, DO       Or   ondansetron (ZOFRAN) injection 4 mg  4 mg Intravenous Q6H PRN Maurilio Lovely D, DO       Oral care mouth rinse  15 mL Mouth Rinse PRN Sherryll Burger, Pratik D, DO       sertraline (ZOLOFT)  tablet 100 mg  100 mg Oral Daily Sherryll Burger, Pratik D, DO   100 mg at 09/06/22 1610     Discharge Medications: Please see discharge summary for a list of discharge medications.  Relevant Imaging Results:  Relevant Lab Results:   Additional Information SSN: [id number]  Elliot Gault, LCSW

## 2022-09-06 NOTE — TOC Initial Note (Signed)
Transition of Care [facility]) - Initial/Assessment Note    Patient Details  Name: Catherine West MRN: [id number] Date of Birth: Sep 21, 1920  [facility]) CM/SW Contact:    Elliot Gault, LCSW Phone Number: 09/06/2022, 1:07 PM  Clinical Narrative:                  Pt admitted from [facility]. PT recommending SNF rehab at dc.  Spoke with pt and family today to review dc planning. They are agreeable to SNF referrals. CMS provider options and star ratings reviewed. Will refer as requested and initiate insurance auth.  TOC will follow.  Expected Discharge Plan: Skilled Nursing Facility Barriers to Discharge: SNF Pending bed offer, Insurance Authorization   Patient Goals and CMS Choice Patient states their goals for this hospitalization and ongoing recovery are:: rehab CMS Medicare.gov Compare Post Acute Care list provided to:: Patient Represenative (must comment) Choice offered to / list presented to : Adult Children [place] ownership interest in [facility].provided to:: Adult Children    Expected Discharge Plan and Services In-house Referral: Clinical Social Work   Post Acute Care Choice: Skilled Nursing Facility Living arrangements for the past 2 months: Assisted Living Facility                                      Prior Living Arrangements/Services Living arrangements for the past 2 months: Assisted Living Facility Lives with:: Facility Resident Patient language and need for interpreter reviewed:: Yes Do you feel safe going back to the place where you live?: Yes      Need for Family Participation in Patient Care: No (Comment) Care giver support system in place?: Yes (comment)   Criminal Activity/Legal Involvement Pertinent to Current Situation/Hospitalization: No - Comment as needed  Activities of Daily Living Home Assistive Devices/Equipment: Cane (specify quad or straight), Wheelchair, Environmental consultant (specify type) (front wheel waker and four  point cane) ADL Screening (condition at time of admission) Patient's cognitive ability adequate to safely complete daily activities?: Yes Is the patient deaf or have difficulty hearing?: No Does the patient have difficulty seeing, even when wearing glasses/contacts?: Yes Does the patient have difficulty concentrating, remembering, or making decisions?: No Patient able to express need for assistance with ADLs?: Yes Does the patient have difficulty dressing or bathing?: Yes Independently performs ADLs?: Yes (appropriate for developmental age) Communication: Independent Is this a change from baseline?: Pre-admission baseline Dressing (OT): Needs assistance Is this a change from baseline?: Pre-admission baseline Grooming: Needs assistance Is this a change from baseline?: Change from baseline, expected to last >3 days Feeding: Independent Bathing: Needs assistance Is this a change from baseline?: Change from baseline, expected to last <3 days Toileting: Needs assistance Is this a change from baseline?: Pre-admission baseline In/Out Bed: Needs assistance Is this a change from baseline?: Pre-admission baseline Walks in Home: Needs assistance Is this a change from baseline?: Pre-admission baseline Does the patient have difficulty walking or climbing stairs?: Yes (Pt in with back pain) Weakness of Legs: Both Weakness of Arms/Hands: None (r/t back pain)  Permission Sought/Granted Permission sought to share information with : Facility Industrial/product designer granted to share information with : Yes, Verbal Permission Granted     Permission granted to share info w AGENCY: snfs        Emotional Assessment Appearance:: Appears younger than stated age Attitude/Demeanor/Rapport: Engaged Affect (typically observed): Pleasant Orientation: : Oriented  to Self, Oriented to Place, Oriented to  Time, Oriented to Situation Alcohol / Substance Use: Not Applicable Psych Involvement: No  (comment)  Admission diagnosis:  Hypoxia [R09.02] Acute CHF (HCC) [I50.9] Uncontrolled hypertension [I10] Acute systolic CHF (congestive heart failure) (HCC) [I50.21] Lumbar compression fracture, closed, initial encounter (HCC) [S32.000A] Bilateral low back pain without sciatica, unspecified chronicity [M54.50] Patient Active Problem List   Diagnosis Date Noted   Acute CHF (HCC) 09/04/2022   Essential (primary) hypertension    Hyperlipidemia, unspecified    Major depressive disorder, single episode, unspecified    Chronic kidney disease, stage 2 (mild)    Chronic kidney disease, unspecified    Cough    Hypertensive chronic kidney disease with stage 1 through stage 4 chronic kidney disease, or unspecified chronic kidney disease    Hypothyroidism, unspecified    Osteoporosis    Gait abnormality 02/22/2019   Falls frequently 02/22/2019   COLLES' FRACTURE, RIGHT 04/15/2009   PCP:  Benita Stabile, MD Pharmacy:   [facility] - [place], [place] - [address] [place] [address] Phone: [phone] Fax: [phone]     Social Determinants of Health (SDOH) Social History: SDOH Screenings   Food Insecurity: No Food Insecurity (09/05/2022)  Housing: Low Risk  (09/05/2022)  Transportation Needs: No Transportation Needs (09/05/2022)  Utilities: Not At Risk (09/05/2022)  Tobacco Use: Low Risk  (09/04/2022)   SDOH Interventions:     Readmission Risk Interventions     No data to display

## 2022-09-07 DIAGNOSIS — I5031 Acute diastolic (congestive) heart failure: Secondary | ICD-10-CM

## 2022-09-07 LAB — CBC
HCT: 39.9 % (ref 36.0–46.0)
Hemoglobin: 13.1 g/dL (ref 12.0–15.0)
MCH: 33 pg (ref 26.0–34.0)
MCHC: 32.8 g/dL (ref 30.0–36.0)
MCV: 100.5 fL — ABNORMAL HIGH (ref 80.0–100.0)
Platelets: 181 10*3/uL (ref 150–400)
RBC: 3.97 MIL/uL (ref 3.87–5.11)
RDW: 13.5 % (ref 11.5–15.5)
WBC: 8.8 10*3/uL (ref 4.0–10.5)
nRBC: 0 % (ref 0.0–0.2)

## 2022-09-07 LAB — BASIC METABOLIC PANEL
Anion gap: 9 (ref 5–15)
BUN: 39 mg/dL — ABNORMAL HIGH (ref 8–23)
CO2: 28 mmol/L (ref 22–32)
Calcium: 8.7 mg/dL — ABNORMAL LOW (ref 8.9–10.3)
Chloride: 100 mmol/L (ref 98–111)
Creatinine, Ser: 1.39 mg/dL — ABNORMAL HIGH (ref 0.44–1.00)
GFR, Estimated: 34 mL/min — ABNORMAL LOW (ref >60)
Glucose, Bld: 90 mg/dL (ref 70–99)
Potassium: 4 mmol/L (ref 3.5–5.1)
Sodium: 137 mmol/L (ref 135–145)

## 2022-09-07 LAB — MAGNESIUM: Magnesium: 2.2 mg/dL (ref 1.7–2.4)

## 2022-09-07 MED ORDER — AMLODIPINE BESYLATE 5 MG PO TABS
5.0000 mg | ORAL_TABLET | Freq: Every day | ORAL | Status: DC
  Administered 2022-09-07: 5 mg via ORAL
  Filled 2022-09-07: qty 1

## 2022-09-07 MED ORDER — HYDROCODONE-ACETAMINOPHEN 5-325 MG PO TABS: 1.0000 | ORAL_TABLET | Freq: Every day | ORAL | 0 refills | Status: DC | PRN

## 2022-09-07 MED ORDER — TRAMADOL HCL 50 MG PO TABS: ORAL_TABLET | ORAL | 0 refills | Status: DC

## 2022-09-07 MED ORDER — FUROSEMIDE 20 MG PO TABS: 20.0000 mg | ORAL_TABLET | Freq: Every day | ORAL | 2 refills | Status: DC | PRN

## 2022-09-07 MED ORDER — AMLODIPINE BESYLATE 5 MG PO TABS: 5.0000 mg | ORAL_TABLET | Freq: Every day | ORAL | 0 refills | Status: DC

## 2022-09-07 NOTE — Care Management Important Message (Signed)
Important Message  Patient Details  Name: Catherine West MRN: [id number] Date of Birth: 1920/09/27   Medicare Important Message Given:  N/A - LOS <3 / Initial given by admissions     Corey Harold 09/07/2022, 11:40 AM

## 2022-09-07 NOTE — TOC Transition Note (Signed)
Transition of Care [facility]) - CM/SW Discharge Note   Patient Details  Name: Catherine West MRN: [id number] Date of Birth: October 15, 1920  [facility] CM/SW Contact:  Villa Herb, LCSWA Phone Number: 09/07/2022, 11:21 AM   Clinical Narrative:    Reps from [facility] met in pts room to be present when PT worked with pt this morning. Facility feels they will be able to manage pts care. Pts family updated and wish for pt to return to [facility] instead of SNF. Facility and family request hospital bed be ordered. CSW sent referral to [facility] with [employer], they will work on and get delivered. CSW spoke to [place] with [facility] who confirms they will pick pt up. CSW to send clinicals to facility. HH has been set up with [facility], Morrie Sheldon has been updated. TOC signing off.   Final next level of care: Assisted Living Barriers to Discharge: Barriers Resolved   Patient Goals and CMS Choice CMS Medicare.gov Compare Post Acute Care list provided to:: Patient Represenative (must comment) Choice offered to / list presented to : Adult Children  Discharge Placement                    Name of family member notified: Daughter Patient and family notified of of transfer: 09/07/22  Discharge Plan and Services Additional resources added to the After Visit Summary for   In-house Referral: Clinical Social Work   Post Acute Care Choice: Skilled Nursing Facility                               Social Determinants of Health (SDOH) Interventions SDOH Screenings   Food Insecurity: No Food Insecurity (09/05/2022)  Housing: Low Risk  (09/05/2022)  Transportation Needs: No Transportation Needs (09/05/2022)  Utilities: Not At Risk (09/05/2022)  Tobacco Use: Low Risk  (09/04/2022)     Readmission Risk Interventions     No data to display

## 2022-09-07 NOTE — NC FL2 (Signed)
[place] MEDICAID FL2 LEVEL OF CARE FORM     IDENTIFICATION  Patient Name: Catherine West Birthdate: 1920-05-30 Sex: female Admission Date (Current Location): 09/04/2022  [facility] and [place] Number:  [name] and Address:  [facility],  [address], [place] [address]      Provider Number: [id number]  Attending Physician Name and Address:  Erick Blinks, DO  Relative Name and Phone Number:       Current Level of Care: Hospital Recommended Level of Care: Assisted Living Facility Prior Approval Number:    Date Approved/Denied:   PASRR Number: [id number]  Discharge Plan: Other (Comment) (Assisted Living Facility)    Current Diagnoses: Patient Active Problem List   Diagnosis Date Noted   Acute CHF (HCC) 09/04/2022   Essential (primary) hypertension    Hyperlipidemia, unspecified    Major depressive disorder, single episode, unspecified    Chronic kidney disease, stage 2 (mild)    Chronic kidney disease, unspecified    Cough    Hypertensive chronic kidney disease with stage 1 through stage 4 chronic kidney disease, or unspecified chronic kidney disease    Hypothyroidism, unspecified    Osteoporosis    Gait abnormality 02/22/2019   Falls frequently 02/22/2019   COLLES' FRACTURE, RIGHT 04/15/2009    Orientation RESPIRATION BLADDER Height & Weight     Self, Time, Place, Situation  Normal Incontinent Weight: 130 lb 1.1 oz (59 kg) Height:  5\' 4"  (162.6 cm)  BEHAVIORAL SYMPTOMS/MOOD NEUROLOGICAL BOWEL NUTRITION STATUS      Incontinent Diet (See D/C summary)  AMBULATORY STATUS COMMUNICATION OF NEEDS Skin   Limited Assist Verbally Normal                       Personal Care Assistance Level of Assistance  Bathing, Feeding, Dressing Bathing Assistance: Limited assistance Feeding assistance: Limited assistance Dressing Assistance: Limited assistance     Functional Limitations Info  Sight, Hearing, Speech Sight Info:  Adequate Hearing Info: Impaired Speech Info: Adequate    SPECIAL CARE FACTORS FREQUENCY  PT (By licensed PT), OT (By licensed OT)     PT Frequency: 5x week OT Frequency: 3x week            Contractures Contractures Info: Not present    Additional Factors Info  Code Status, Allergies Code Status Info: DNR Allergies Info: Aspirin           Current Medications (09/07/2022):  This is the current hospital active medication list Current Facility-Administered Medications  Medication Dose Route Frequency Provider Last Rate Last Admin   acetaminophen (TYLENOL) tablet 650 mg  650 mg Oral Q6H Shah, Pratik D, DO   650 mg at 09/07/22 0932   amLODipine (NORVASC) tablet 5 mg  5 mg Oral Daily Sherryll Burger, Pratik D, DO   5 mg at 09/07/22 0933   heparin injection 5,000 Units  5,000 Units Subcutaneous Q8H Shah, Pratik D, DO   5,000 Units at 09/07/22 5621   hydrALAZINE (APRESOLINE) injection 10 mg  10 mg Intravenous Q4H PRN Sherryll Burger, Pratik D, DO       losartan (COZAAR) tablet 100 mg  100 mg Oral Daily Sherryll Burger, Pratik D, DO   100 mg at 09/07/22 0933   ondansetron (ZOFRAN) tablet 4 mg  4 mg Oral Q6H PRN Sherryll Burger, Pratik D, DO       Or   ondansetron (ZOFRAN) injection 4 mg  4 mg Intravenous Q6H PRN Sherryll Burger, Pratik D, DO  Oral care mouth rinse  15 mL Mouth Rinse PRN Sherryll Burger, Pratik D, DO       sertraline (ZOLOFT) tablet 100 mg  100 mg Oral Daily Sherryll Burger, Pratik D, DO   100 mg at 09/07/22 0933   traMADol (ULTRAM) tablet 50 mg  50 mg Oral Q12H PRN Maurilio Lovely D, DO   50 mg at 09/07/22 1105     Discharge Medications: amLODipine 5 MG tablet Commonly known as: NORVASC Take 1 tablet (5 mg total) by mouth daily. Start taking on: Sep 08, 2022    furosemide 20 MG tablet Commonly known as: Lasix Take 1 tablet (20 mg total) by mouth daily as needed for fluid or edema.    HYDROcodone-acetaminophen 5-325 MG tablet Commonly known as: NORCO/VICODIN Take 1 tablet by mouth daily as needed for moderate pain or severe  pain. What changed: reasons to take this    losartan 100 MG tablet Commonly known as: COZAAR Take 1 tablet by mouth daily.    sertraline 100 MG tablet Commonly known as: ZOLOFT Take 100 mg by mouth daily.    traMADol 50 MG tablet Commonly known as: ULTRAM Take 1 tablet every 6 hours by oral route for 7 days.      Relevant Imaging Results:  Relevant Lab Results:   Additional Information SSN: [id number], [place]

## 2022-09-07 NOTE — Progress Notes (Addendum)
Patient requires frequent re-positioning of the body in ways that cannot be achieved with an ordinary bed or wedge pillow, to eliminate pain, reduce pressure, and the head of the bed to be elevated more than 30 degrees most of the time due to patients congestive heart failure.

## 2022-09-07 NOTE — Discharge Summary (Signed)
Physician Discharge Summary  Catherine West:[id number] DOB: 04/25/1921 DOA: 09/04/2022  PCP: Benita Stabile, MD  Admit date: 09/04/2022  Discharge date: 09/07/2022  Admitted From:ALF  Disposition:  ALF  Recommendations for Outpatient Follow-up:  Follow up with PCP in 1-2 weeks Repeat BMP in 1 week Continue to monitor blood pressures closely and remain on losartan and amlodipine as prescribed with Lasix as needed Continue other home medications as prior  Home Health: Yes with PT/OT  Equipment/Devices: Hospital bed  Discharge Condition:Stable  CODE STATUS: DNR  Diet recommendation: Heart Healthy  Brief/Interim Summary:  Catherine West is a 89 y.o. female with medical history significant for hypertension, depression, and osteoporosis who presented to the ED with complaints of low back pain with radiation to her groin and bilateral legs.  She was noted to have significant blood pressure elevation in the ED as well as some mild hypoxemia.  She was admitted for hypertension control as well as diuresis and evaluation for CHF.  2D echocardiogram results as noted below with LVEF 40-45% and grade 2 diastolic dysfunction.  She continues to have some blood pressure elevations which were discussed with cardiology with recommendations to start amlodipine on top of losartan.  Continue to monitor blood pressure readings outpatient and Lasix will be prescribed as well for as needed edema or shortness of breath.  Follow-up as noted above.  Discharge Diagnoses:  Principal Problem:   Acute CHF [facility]) Active Problems:   Gait abnormality   Falls frequently   Essential (primary) hypertension   Hypothyroidism, unspecified   Osteoporosis  Principal discharge diagnosis: Acute diastolic CHF exacerbation in the setting of uncontrolled hypertension.  Mild bradycardia with concern for AV block.  Discharge Instructions  Discharge Instructions     Diet - low sodium heart healthy   Complete by: As  directed    Increase activity slowly   Complete by: As directed       Allergies as of 09/07/2022       Reactions   Aspirin Palpitations   Pt has no knowledge of this         Medication List     STOP taking these medications    hydrochlorothiazide 12.5 MG capsule Commonly known as: MICROZIDE   metoprolol succinate 100 MG 24 hr tablet Commonly known as: TOPROL-XL   trimethoprim 100 MG tablet Commonly known as: TRIMPEX       TAKE these medications    amLODipine 5 MG tablet Commonly known as: NORVASC Take 1 tablet (5 mg total) by mouth daily. Start taking on: Sep 08, 2022   furosemide 20 MG tablet Commonly known as: Lasix Take 1 tablet (20 mg total) by mouth daily as needed for fluid or edema.   HYDROcodone-acetaminophen 5-325 MG tablet Commonly known as: NORCO/VICODIN Take 1 tablet by mouth daily as needed for moderate pain or severe pain. What changed: reasons to take this   losartan 100 MG tablet Commonly known as: COZAAR Take 1 tablet by mouth daily.   sertraline 100 MG tablet Commonly known as: ZOLOFT Take 100 mg by mouth daily.   traMADol 50 MG tablet Commonly known as: ULTRAM Take 1 tablet every 6 hours by oral route for 7 days.               Durable Medical Equipment  (From admission, onward)           Start     Ordered   09/07/22 1011  For home use only [facility]  bed  Once       Question Answer Comment  Length of Need Lifetime   Patient has (list medical condition): weakness   The above medical condition requires: Patient requires the ability to reposition frequently   Head must be elevated greater than: 30 degrees   Bed type Semi-electric      09/07/22 1010            Contact information for follow-up providers     Benita Stabile, MD. Schedule an appointment as soon as possible for a visit in 1 week(s).   Specialty: Internal Medicine Contact information: [address] [place] [address] [phone]               Contact information for after-discharge care     Destination     [facility]erred SNF .   Service: Skilled Nursing Contact information: [address] [place] [address] [phone]                    Allergies  Allergen Reactions   Aspirin Palpitations    Pt has no knowledge of this     Consultations: None   Procedures/Studies: ECHOCARDIOGRAM COMPLETE  Result Date: 09/05/2022    ECHOCARDIOGRAM REPORT   Patient Name:   Catherine West Date of Exam: 09/05/2022 Medical Rec #:  [id number]        Height:       64.0 in Accession #:    [id number]       Weight:       129.9 lb Date of Birth:  11-10-1920         BSA:          1.628 m Patient Age:    89 years        BP:           190/87 mmHg Patient Gender: F                HR:           46 bpm. [facility] Procedure: 2D Echo, Color Doppler and Cardiac Doppler Indications:    I50.31 Acute diastolic (congestive) heart failure  History:        Patient has no prior history of Echocardiogram examinations.                 CHF; Risk Factors:Hypertension and Dyslipidemia.  Sonographer:    Irving Burton Senior RDCS Referring Phys: [id number] Lamont Dowdy [facility]  Sonographer Comments: Suboptimal parasternal window IMPRESSIONS  1. Left ventricular ejection fraction, by estimation, is 40 to 45%. The left ventricle has mildly decreased function. The left ventricle demonstrates global hypokinesis. There is mild left ventricular hypertrophy. Left ventricular diastolic parameters are consistent with Grade II diastolic dysfunction (pseudonormalization). Elevated left ventricular end-diastolic pressure. The E/e' is 22.  2. Right ventricular systolic function is mildly reduced. The right ventricular size is normal. There is normal pulmonary artery systolic pressure. The estimated right ventricular systolic pressure is 20.1 mmHg.  3. Left atrial size was mildly dilated.  4. The mitral valve is abnormal. Mild  mitral valve regurgitation.  5. The aortic valve is tricuspid. Aortic valve regurgitation is not visualized.  6. The inferior vena cava is normal in size with greater than 50% respiratory variability, suggesting right atrial pressure of 3 mmHg.  7. Rhythm strip during this exam demonstrates sinus bradycardia. Comparison(s): No prior Echocardiogram. FINDINGS  Left Ventricle: Left ventricular ejection  fraction, by estimation, is 40 to 45%. The left ventricle has mildly decreased function. The left ventricle demonstrates global hypokinesis. The left ventricular internal cavity size was normal in size. There is  mild left ventricular hypertrophy. Left ventricular diastolic parameters are consistent with Grade II diastolic dysfunction (pseudonormalization). Elevated left ventricular end-diastolic pressure. The E/e' is 22. Right Ventricle: The right ventricular size is normal. No increase in right ventricular wall thickness. Right ventricular systolic function is mildly reduced. There is normal pulmonary artery systolic pressure. The tricuspid regurgitant velocity is 2.07 m/s, and with an assumed right atrial pressure of 3 mmHg, the estimated right ventricular systolic pressure is 20.1 mmHg. Left Atrium: Left atrial size was mildly dilated. Right Atrium: Right atrial size was normal in size. Pericardium: There is no evidence of pericardial effusion. Mitral Valve: The mitral valve is abnormal. Mild mitral annular calcification. Mild mitral valve regurgitation. Tricuspid Valve: The tricuspid valve is grossly normal. Tricuspid valve regurgitation is trivial. Aortic Valve: The aortic valve is tricuspid. Aortic valve regurgitation is not visualized. Pulmonic Valve: The pulmonic valve was normal in structure. Pulmonic valve regurgitation is not visualized. Aorta: The aortic root and ascending aorta are structurally normal, with no evidence of dilitation. Venous: The inferior vena cava is normal in size with greater than 50%  respiratory variability, suggesting right atrial pressure of 3 mmHg. IAS/Shunts: No atrial level shunt detected by color flow Doppler. EKG: Rhythm strip during this exam demonstrates sinus bradycardia.  LEFT VENTRICLE PLAX 2D LVIDd:         4.50 cm      Diastology LVIDs:         3.10 cm      LV e' medial:    3.48 cm/s LV PW:         0.90 cm      LV E/e' medial:  23.3 LV IVS:        1.30 cm      LV e' lateral:   3.05 cm/s LVOT diam:     1.90 cm      LV E/e' lateral: 26.6 LV SV:         50 LV SV Index:   31 LVOT Area:     2.84 cm  LV Volumes (MOD) LV vol d, MOD A2C: 103.0 ml LV vol d, MOD A4C: 111.0 ml LV vol s, MOD A2C: 57.5 ml LV vol s, MOD A4C: 58.3 ml LV SV MOD A2C:     45.5 ml LV SV MOD A4C:     111.0 ml LV SV MOD BP:      58.0 ml RIGHT VENTRICLE RV S prime:     9.25 cm/s TAPSE (M-mode): 2.0 cm LEFT ATRIUM             Index        RIGHT ATRIUM           Index LA diam:        3.20 cm 1.97 cm/m   RA Area:     13.60 cm LA Vol (A2C):   58.1 ml 35.68 ml/m  RA Volume:   29.60 ml  18.18 ml/m LA Vol (A4C):   59.8 ml 36.73 ml/m LA Biplane Vol: 60.7 ml 37.28 ml/m  AORTIC VALVE LVOT Vmax:   86.20 cm/s LVOT Vmean:  57.400 cm/s LVOT VTI:    0.177 m  AORTA Ao Root diam: 2.85 cm Ao Asc diam:  3.10 cm MITRAL VALVE  TRICUSPID VALVE MV Area (PHT): 2.16 cm       TR Peak grad:   17.1 mmHg MV Decel Time: 351 msec       TR Vmax:        207.00 cm/s MR Peak grad:    91.8 mmHg MR Mean grad:    36.0 mmHg    SHUNTS MR Vmax:         479.00 cm/s  Systemic VTI:  0.18 m MR Vmean:        269.0 cm/s   Systemic Diam: 1.90 cm MR PISA:         1.01 cm MR PISA Eff ROA: 8 mm MR PISA Radius:  0.40 cm MV E velocity: 81.00 cm/s MV A velocity: 72.80 cm/s MV E/A ratio:  1.11 Zoila Shutter MD Electronically signed by Zoila Shutter MD Signature Date/Time: 09/05/2022/11:22:58 AM    Final    DG Chest Port 1 View  Result Date: 09/04/2022 CLINICAL DATA:  Shortness of breath. EXAM: PORTABLE CHEST 1 VIEW COMPARISON:  02/22/2018  FINDINGS: Heart size remains within normal limits. Ectasia and atherosclerotic calcification of thoracic aorta again noted. Diffuse interstitial infiltrates are seen which are new. Tiny bilateral pleural effusions are also seen. No evidence of pulmonary consolidation. IMPRESSION: New diffuse interstitial infiltrates, which may be due to interstitial edema or pneumonitis. Tiny bilateral pleural effusions. Electronically Signed   By: Danae Orleans M.D.   On: 09/04/2022 13:27   CT Renal Stone Study  Result Date: 09/04/2022 CLINICAL DATA:  Abdominal/flank pain EXAM: CT ABDOMEN AND PELVIS WITHOUT CONTRAST TECHNIQUE: Multidetector CT imaging of the abdomen and pelvis was performed following the standard protocol without IV contrast. RADIATION DOSE REDUCTION: This exam was performed according to the departmental dose-optimization program which includes automated exposure control, adjustment of the mA and/or kV according to patient size and/or use of iterative reconstruction technique. COMPARISON:  Lumbar spine radiograph dated August 23, 2022 FINDINGS: Lower chest: Cardiomegaly. Small right and trace left pleural effusions. Small hiatal hernia. Bilateral bronchiectasis, small nodules and more confluent areas of consolidation. Hepatobiliary: No focal liver abnormality is seen. No gallstones, gallbladder wall thickening, or biliary dilatation. Pancreas: Unremarkable. No pancreatic ductal dilatation or surrounding inflammatory changes. Spleen: Normal in size without focal abnormality. Adrenals/Urinary Tract: Small low-attenuation left adrenal gland nodule measuring 1.2 cm on series 2, image 14, likely benign adenoma with no specific follow-up imaging recommended. Hydronephrosis. Punctate bilateral nonobstructing stones of the bilateral upper poles. Bladder is unremarkable. Stomach/Bowel: Stomach is within normal limits. Mild diverticulosis. No evidence of bowel wall thickening, distention, or inflammatory changes.  Vascular/Lymphatic: Aortic atherosclerosis. No enlarged abdominal or pelvic lymph nodes. Reproductive: Uterus and bilateral adnexa are unremarkable. Other: No abdominal wall hernia or abnormality. No abdominopelvic ascites. Musculoskeletal: Moderate compression deformity of the inferior endplate of L1, unchanged when compared with prior lumbar spine radiograph. Dextrocurvature of the lumbar spine. Acute osseous abnormality. IMPRESSION: 1. No acute findings in the abdomen or pelvis, including no evidence of obstructive uropathy. 2. Small right and trace left pleural effusions. 3. Moderate compression deformity of the inferior endplate of L1, unchanged when compared with prior lumbar spine radiograph. 4. Bilateral bronchiectasis and small nodules with more confluent areas of consolidation, findings are likely due to chronic atypical infection. Consider dedicated chest CT for complete evaluation. 5. Aortic Atherosclerosis (ICD10-I70.0). Electronically Signed   By: Allegra Lai M.D.   On: 09/04/2022 12:50   DG Lumbar Spine Complete  Result Date: 08/24/2022 CLINICAL DATA:  Low back pain  for awhile, no known injury EXAM: LUMBAR SPINE - COMPLETE 4+ VIEW COMPARISON:  None Available. FINDINGS: Osseous demineralization with biconvex lumbar scoliosis. Five non-rib-bearing lumbar vertebra. Multilevel facet degenerative changes. Mild age indeterminate concavity at inferior endplate L1. No additional fracture, subluxation, or bone destruction. SI joints preserved. Atherosclerotic calcifications aorta. IMPRESSION: Osseous demineralization with degenerative facet disease changes and dextroconvex scoliosis lumbar spine. Age-indeterminate mild inferior endplate compression deformity L1. Aortic Atherosclerosis (ICD10-I70.0). Electronically Signed   By: Ulyses Southward M.D.   On: 08/24/2022 14:57     Discharge Exam: Vitals:   09/06/22 2016 09/07/22 0631  BP: (!) 148/58 (!) 181/82  Pulse: (!) 46 (!) 49  Resp: 18 18  Temp:  98.4 F (36.9 C) 97.6 F (36.4 C)  SpO2: 94% 94%   Vitals:   09/06/22 1101 09/06/22 2016 09/07/22 0500 09/07/22 0631  BP: (!) 149/62 (!) 148/58  (!) 181/82  Pulse:  (!) 46  (!) 49  Resp:  18  18  Temp:  98.4 F (36.9 C)  97.6 F (36.4 C)  TempSrc:  Oral  Oral  SpO2:  94%  94%  Weight:   59 kg   Height:        General: Pt is alert, awake, not in acute distress Cardiovascular: RRR, S1/S2 +, no rubs, no gallops Respiratory: CTA bilaterally, no wheezing, no rhonchi Abdominal: Soft, NT, ND, bowel sounds + Extremities: no edema, no cyanosis    The results of significant diagnostics from this hospitalization (including imaging, microbiology, ancillary and laboratory) are listed below for reference.     Microbiology: Recent Results (from the past 240 hour(s))  SARS Coronavirus 2 by RT PCR (hospital order, performed in [facility] hospital lab) *cepheid single result test* Anterior Nasal Swab     Status: None   Collection Time: 09/04/22  1:15 PM   Specimen: Anterior Nasal Swab  Result Value Ref Range Status   SARS Coronavirus 2 by RT PCR NEGATIVE NEGATIVE Final    Comment: (NOTE) SARS-CoV-2 target nucleic acids are NOT DETECTED.  The SARS-CoV-2 RNA is generally detectable in upper and lower respiratory specimens during the acute phase of infection. The lowest concentration of SARS-CoV-2 viral copies this assay can detect is 250 copies / mL. A negative result does not preclude SARS-CoV-2 infection and should not be used as the sole basis for treatment or other patient management decisions.  A negative result may occur with improper specimen collection / handling, submission of specimen other than nasopharyngeal swab, presence of viral mutation(s) within the areas targeted by this assay, and inadequate number of viral copies (<250 copies / mL). A negative result must be combined with clinical observations, patient history, and epidemiological information.  Fact Sheet for  Patients:   RoadLapTop.co.za  Fact Sheet for Healthcare Providers: http://kim-miller.com/  This test is not yet approved or  cleared by the [place] FDA and has been authorized for detection and/or diagnosis of SARS-CoV-2 by FDA under an Emergency Use Authorization (EUA).  This EUA will remain in effect (meaning this test can be used) for the duration of the COVID-19 declaration under Section 564(b)(1) of the Act, 21 U.S.C. section 360bbb-3(b)(1), unless the authorization is terminated or revoked sooner.  Performed at [facility], [address], [place], [place] [address]   MRSA Next Gen by PCR, Nasal     Status: None   Collection Time: 09/04/22  6:43 PM   Specimen: Nasal Mucosa; Nasal Swab  Result Value Ref Range Status   MRSA  by PCR Next Gen NOT DETECTED NOT DETECTED Final    Comment: (NOTE) The GeneXpert MRSA Assay (FDA approved for NASAL specimens only), is one component of a comprehensive MRSA colonization surveillance program. It is not intended to diagnose MRSA infection nor to guide or monitor treatment for MRSA infections. Test performance is not FDA approved in patients less than 13 years old. Performed at [facility], [address], [place], [place] [address]      Labs: BNP (last 3 results) Recent Labs    09/04/22 1345  BNP 2,170.0*   Basic Metabolic Panel: Recent Labs  Lab 09/04/22 1345 09/05/22 0443 09/06/22 0516 09/07/22 0432  NA 135 137 137 137  K 3.1* 2.9* 4.2 4.0  CL 97* 97* 100 100  CO2 28 29 29 28   GLUCOSE 137* 89 91 90  BUN 23 26* 32* 39*  CREATININE 1.15* 1.22* 1.31* 1.39*  CALCIUM 8.8* 8.6* 8.7* 8.7*  MG  --  2.1 2.1 2.2   Liver Function Tests: No results for input(s): "AST", "ALT", "ALKPHOS", "BILITOT", "PROT", "ALBUMIN" in the last 168 hours. No results for input(s): "LIPASE", "AMYLASE" in the last 168 hours. No results for input(s): "AMMONIA" in the last 168 hours. CBC: Recent Labs   Lab 09/04/22 1345 09/05/22 0443 09/07/22 0432  WBC 11.9* 8.9 8.8  HGB 14.3 12.9 13.1  HCT 42.4 38.8 39.9  MCV 97.2 99.0 100.5*  PLT 214 202 181   Cardiac Enzymes: No results for input(s): "CKTOTAL", "CKMB", "CKMBINDEX", "TROPONINI" in the last 168 hours. BNP: Invalid input(s): "POCBNP" CBG: No results for input(s): "GLUCAP" in the last 168 hours. D-Dimer No results for input(s): "DDIMER" in the last 72 hours. Hgb A1c No results for input(s): "HGBA1C" in the last 72 hours. Lipid Profile No results for input(s): "CHOL", "HDL", "LDLCALC", "TRIG", "CHOLHDL", "LDLDIRECT" in the last 72 hours. Thyroid function studies No results for input(s): "TSH", "T4TOTAL", "T3FREE", "THYROIDAB" in the last 72 hours.  Invalid input(s): "FREET3" Anemia work up No results for input(s): "VITAMINB12", "FOLATE", "FERRITIN", "TIBC", "IRON", "RETICCTPCT" in the last 72 hours. Urinalysis    Component Value Date/Time   COLORURINE BROWN (A) 02/22/2018 0146   APPEARANCEUR CLOUDY (A) 02/22/2018 0146   LABSPEC 1.015 02/22/2018 0146   PHURINE 6.5 02/22/2018 0146   GLUCOSEU NEGATIVE 02/22/2018 0146   HGBUR LARGE (A) 02/22/2018 0146   BILIRUBINUR MODERATE (A) 02/22/2018 0146   KETONESUR TRACE (A) 02/22/2018 0146   PROTEINUR >300 (A) 02/22/2018 0146   NITRITE POSITIVE (A) 02/22/2018 0146   LEUKOCYTESUR MODERATE (A) 02/22/2018 0146   Sepsis Labs Recent Labs  Lab 09/04/22 1345 09/05/22 0443 09/07/22 0432  WBC 11.9* 8.9 8.8   Microbiology Recent Results (from the past 240 hour(s))  SARS Coronavirus 2 by RT PCR (hospital order, performed in [facility] hospital lab) *cepheid single result test* Anterior Nasal Swab     Status: None   Collection Time: 09/04/22  1:15 PM   Specimen: Anterior Nasal Swab  Result Value Ref Range Status   SARS Coronavirus 2 by RT PCR NEGATIVE NEGATIVE Final    Comment: (NOTE) SARS-CoV-2 target nucleic acids are NOT DETECTED.  The SARS-CoV-2 RNA is generally detectable  in upper and lower respiratory specimens during the acute phase of infection. The lowest concentration of SARS-CoV-2 viral copies this assay can detect is 250 copies / mL. A negative result does not preclude SARS-CoV-2 infection and should not be used as the sole basis for treatment or other patient management decisions.  A negative  result may occur with improper specimen collection / handling, submission of specimen other than nasopharyngeal swab, presence of viral mutation(s) within the areas targeted by this assay, and inadequate number of viral copies (<250 copies / mL). A negative result must be combined with clinical observations, patient history, and epidemiological information.  Fact Sheet for Patients:   RoadLapTop.co.za  Fact Sheet for Healthcare Providers: http://kim-miller.com/  This test is not yet approved or  cleared by the [place] FDA and has been authorized for detection and/or diagnosis of SARS-CoV-2 by FDA under an Emergency Use Authorization (EUA).  This EUA will remain in effect (meaning this test can be used) for the duration of the COVID-19 declaration under Section 564(b)(1) of the Act, 21 U.S.C. section 360bbb-3(b)(1), unless the authorization is terminated or revoked sooner.  Performed at [facility], [address] [place], [place] [address]   MRSA Next Gen by PCR, Nasal     Status: None   Collection Time: 09/04/22  6:43 PM   Specimen: Nasal Mucosa; Nasal Swab  Result Value Ref Range Status   MRSA by PCR Next Gen NOT DETECTED NOT DETECTED Final    Comment: (NOTE) The GeneXpert MRSA Assay (FDA approved for NASAL specimens only), is one component of a comprehensive MRSA colonization surveillance program. It is not intended to diagnose MRSA infection nor to guide or monitor treatment for MRSA infections. Test performance is not FDA approved in patients less than 74 years old. Performed at [facility], [address], [place], [place] [address]      Time coordinating discharge: 35 minutes  SIGNED:   Erick Blinks, DO [facility] 09/07/2022, 10:33 AM  If 7PM-7AM, please contact night-coverage [email or web address]

## 2022-09-07 NOTE — Progress Notes (Signed)
Physical Therapy Treatment Patient Details Name: Catherine West MRN: [id number] DOB: 05-20-1920 Today's Date: 09/07/2022   History of Present Illness Catherine West is a 89 y.o. female with medical history significant for hypertension, depression, and osteoporosis who presented to the ED with complaints of low back pain with radiation to her groin and bilateral legs.  She was noted to have significant blood pressure elevation in the ED as well as some mild hypoxemia.  She denies any chest pain, fevers, chills, or shortness of breath.  Denies any lower extremity edema.    PT Comments    Patient presents with family members, ALF staff present in room and patient agreeable for therapy.  Patient demonstrates slightly labored movement for sitting up at bedside with c/o low back pain, once seated has fair/good sitting balance, able to transfer to chair requiring verbal/tactile cueing for proper hand placement, tolerated walking in room/hallway without loss of balance and limited mostly due to fatigue.  Patient tolerated sitting up in chair with visitors present after therapy.  Patient will benefit from continued skilled physical therapy in hospital and recommended venue below to increase strength, balance, endurance for safe ADLs and gait.    Recommendations for follow up therapy are one component of a multi-disciplinary discharge planning process, led by the attending physician.  Recommendations may be updated based on patient status, additional functional criteria and insurance authorization.  Follow Up Recommendations  Can patient physically be transported by private vehicle: Yes    Assistance Recommended at Discharge Set up Supervision/Assistance  Patient can return home with the following A little help with walking and/or transfers;A little help with bathing/dressing/bathroom;Help with stairs or ramp for entrance;Assistance with cooking/housework   Equipment Recommendations  None recommended  by PT    Recommendations for Other Services       Precautions / Restrictions Precautions Precautions: Fall Restrictions Weight Bearing Restrictions: No     Mobility  Bed Mobility Overal bed mobility: Needs Assistance Bed Mobility: Supine to Sit     Supine to sit: Min assist     General bed mobility comments: increased time, labored movement with c/o low back pain    Transfers Overall transfer level: Needs assistance Equipment used: Rolling walker (2 wheels) Transfers: Sit to/from Stand, Bed to chair/wheelchair/BSC Sit to Stand: Min guard, Min assist   Step pivot transfers: Min guard, Min assist       General transfer comment: required verbal/tactile cueing for proper hand placement during sit to stands with fair carryover    Ambulation/Gait Ambulation/Gait assistance: Min assist, Min guard Gait Distance (Feet): 55 Feet Assistive device: Rolling walker (2 wheels) Gait Pattern/deviations: Decreased step length - right, Decreased step length - left, Decreased stride length Gait velocity: decreased     General Gait Details: increased endurance/distance for ambulation with slow labored cadence without loss of balance, limited mostly due to fatigue   Stairs             Wheelchair Mobility    Modified Rankin (Stroke Patients Only)       Balance Overall balance assessment: Needs assistance Sitting-balance support: Feet supported, No upper extremity supported Sitting balance-Leahy Scale: Fair Sitting balance - Comments: fair/good seated at EOB   Standing balance support: Reliant on assistive device for balance, During functional activity, Bilateral upper extremity supported Standing balance-Leahy Scale: Fair Standing balance comment: fair/good using RW  Cognition Arousal/Alertness: Awake/alert Behavior During Therapy: WFL for tasks assessed/performed Overall Cognitive Status: Within Functional Limits for tasks  assessed                                          Exercises      General Comments        Pertinent Vitals/Pain Pain Assessment Pain Assessment: Faces Faces Pain Scale: Hurts little more Pain Location: low back Pain Descriptors / Indicators: Sore, Grimacing Pain Intervention(s): Limited activity within patient's tolerance, Monitored during session, Repositioned    Home Living                          Prior Function            PT Goals (current goals can now be found in the care plan section) Acute Rehab PT Goals Patient Stated Goal: Go to assisted living PT Goal Formulation: With patient/family Time For Goal Achievement: 09/09/22 Potential to Achieve Goals: Good Progress towards PT goals: Progressing toward goals    Frequency    Min 3X/week      PT Plan Discharge plan needs to be updated    Co-evaluation              AM-PAC PT "6 Clicks" Mobility   Outcome Measure  Help needed turning from your back to your side while in a flat bed without using bedrails?: A Little Help needed moving from lying on your back to sitting on the side of a flat bed without using bedrails?: A Little Help needed moving to and from a bed to a chair (including a wheelchair)?: A Little Help needed standing up from a chair using your arms (e.g., wheelchair or bedside chair)?: A Little Help needed to walk in hospital room?: A Little Help needed climbing 3-5 steps with a railing? : A Lot 6 Click Score: 17    End of Session   Activity Tolerance: Patient tolerated treatment well;Patient limited by fatigue Patient left: in chair;with call bell/phone within reach;with family/visitor present Nurse Communication: Mobility status PT Visit Diagnosis: Unsteadiness on feet (R26.81);Other abnormalities of gait and mobility (R26.89);Muscle weakness (generalized) (M62.81)     Time: 6301-6010 PT Time Calculation (min) (ACUTE ONLY): 16 min  Charges:   $Therapeutic Activity: 8-22 mins                     11:23 AM, 09/07/22 Ocie Bob, MPT Physical Therapist with [facility] [phone] office [phone] mobile phone

## 2022-09-09 DIAGNOSIS — M6281 Muscle weakness (generalized): Secondary | ICD-10-CM | POA: Diagnosis not present

## 2022-09-09 DIAGNOSIS — E782 Mixed hyperlipidemia: Secondary | ICD-10-CM | POA: Diagnosis not present

## 2022-09-09 DIAGNOSIS — I509 Heart failure, unspecified: Secondary | ICD-10-CM | POA: Diagnosis not present

## 2022-09-09 DIAGNOSIS — N182 Chronic kidney disease, stage 2 (mild): Secondary | ICD-10-CM | POA: Diagnosis not present

## 2022-09-09 DIAGNOSIS — M810 Age-related osteoporosis without current pathological fracture: Secondary | ICD-10-CM | POA: Diagnosis not present

## 2022-09-09 DIAGNOSIS — Z9181 History of falling: Secondary | ICD-10-CM | POA: Diagnosis not present

## 2022-09-09 DIAGNOSIS — F419 Anxiety disorder, unspecified: Secondary | ICD-10-CM | POA: Diagnosis not present

## 2022-09-09 DIAGNOSIS — F329 Major depressive disorder, single episode, unspecified: Secondary | ICD-10-CM | POA: Diagnosis not present

## 2022-09-09 DIAGNOSIS — I130 Hypertensive heart and chronic kidney disease with heart failure and stage 1 through stage 4 chronic kidney disease, or unspecified chronic kidney disease: Secondary | ICD-10-CM | POA: Diagnosis not present

## 2022-09-09 DIAGNOSIS — M545 Low back pain, unspecified: Secondary | ICD-10-CM | POA: Diagnosis not present

## 2022-09-09 DIAGNOSIS — E559 Vitamin D deficiency, unspecified: Secondary | ICD-10-CM | POA: Diagnosis not present

## 2022-09-09 DIAGNOSIS — E039 Hypothyroidism, unspecified: Secondary | ICD-10-CM | POA: Diagnosis not present

## 2022-09-09 DIAGNOSIS — R262 Difficulty in walking, not elsewhere classified: Secondary | ICD-10-CM | POA: Diagnosis not present

## 2022-09-10 DIAGNOSIS — I10 Essential (primary) hypertension: Secondary | ICD-10-CM | POA: Diagnosis not present

## 2022-09-10 DIAGNOSIS — E785 Hyperlipidemia, unspecified: Secondary | ICD-10-CM | POA: Diagnosis not present

## 2022-09-10 DIAGNOSIS — F329 Major depressive disorder, single episode, unspecified: Secondary | ICD-10-CM | POA: Diagnosis not present

## 2022-09-10 DIAGNOSIS — I509 Heart failure, unspecified: Secondary | ICD-10-CM | POA: Diagnosis not present

## 2022-09-13 DIAGNOSIS — M545 Low back pain, unspecified: Secondary | ICD-10-CM | POA: Diagnosis not present

## 2022-09-13 DIAGNOSIS — I110 Hypertensive heart disease with heart failure: Secondary | ICD-10-CM | POA: Diagnosis not present

## 2022-09-13 DIAGNOSIS — I5031 Acute diastolic (congestive) heart failure: Secondary | ICD-10-CM | POA: Diagnosis not present

## 2022-09-14 DIAGNOSIS — I509 Heart failure, unspecified: Secondary | ICD-10-CM | POA: Diagnosis not present

## 2022-09-14 DIAGNOSIS — Z9181 History of falling: Secondary | ICD-10-CM | POA: Diagnosis not present

## 2022-09-14 DIAGNOSIS — F419 Anxiety disorder, unspecified: Secondary | ICD-10-CM | POA: Diagnosis not present

## 2022-09-14 DIAGNOSIS — E559 Vitamin D deficiency, unspecified: Secondary | ICD-10-CM | POA: Diagnosis not present

## 2022-09-14 DIAGNOSIS — F329 Major depressive disorder, single episode, unspecified: Secondary | ICD-10-CM | POA: Diagnosis not present

## 2022-09-14 DIAGNOSIS — M545 Low back pain, unspecified: Secondary | ICD-10-CM | POA: Diagnosis not present

## 2022-09-14 DIAGNOSIS — I130 Hypertensive heart and chronic kidney disease with heart failure and stage 1 through stage 4 chronic kidney disease, or unspecified chronic kidney disease: Secondary | ICD-10-CM | POA: Diagnosis not present

## 2022-09-14 DIAGNOSIS — M810 Age-related osteoporosis without current pathological fracture: Secondary | ICD-10-CM | POA: Diagnosis not present

## 2022-09-14 DIAGNOSIS — R262 Difficulty in walking, not elsewhere classified: Secondary | ICD-10-CM | POA: Diagnosis not present

## 2022-09-14 DIAGNOSIS — M6281 Muscle weakness (generalized): Secondary | ICD-10-CM | POA: Diagnosis not present

## 2022-09-14 DIAGNOSIS — E782 Mixed hyperlipidemia: Secondary | ICD-10-CM | POA: Diagnosis not present

## 2022-09-14 DIAGNOSIS — E039 Hypothyroidism, unspecified: Secondary | ICD-10-CM | POA: Diagnosis not present

## 2022-09-14 DIAGNOSIS — N182 Chronic kidney disease, stage 2 (mild): Secondary | ICD-10-CM | POA: Diagnosis not present

## 2022-09-16 DIAGNOSIS — I509 Heart failure, unspecified: Secondary | ICD-10-CM | POA: Diagnosis not present

## 2022-09-16 DIAGNOSIS — E559 Vitamin D deficiency, unspecified: Secondary | ICD-10-CM | POA: Diagnosis not present

## 2022-09-16 DIAGNOSIS — M810 Age-related osteoporosis without current pathological fracture: Secondary | ICD-10-CM | POA: Diagnosis not present

## 2022-09-16 DIAGNOSIS — E039 Hypothyroidism, unspecified: Secondary | ICD-10-CM | POA: Diagnosis not present

## 2022-09-16 DIAGNOSIS — R262 Difficulty in walking, not elsewhere classified: Secondary | ICD-10-CM | POA: Diagnosis not present

## 2022-09-16 DIAGNOSIS — E782 Mixed hyperlipidemia: Secondary | ICD-10-CM | POA: Diagnosis not present

## 2022-09-16 DIAGNOSIS — F419 Anxiety disorder, unspecified: Secondary | ICD-10-CM | POA: Diagnosis not present

## 2022-09-16 DIAGNOSIS — N182 Chronic kidney disease, stage 2 (mild): Secondary | ICD-10-CM | POA: Diagnosis not present

## 2022-09-16 DIAGNOSIS — I130 Hypertensive heart and chronic kidney disease with heart failure and stage 1 through stage 4 chronic kidney disease, or unspecified chronic kidney disease: Secondary | ICD-10-CM | POA: Diagnosis not present

## 2022-09-16 DIAGNOSIS — M6281 Muscle weakness (generalized): Secondary | ICD-10-CM | POA: Diagnosis not present

## 2022-09-16 DIAGNOSIS — M545 Low back pain, unspecified: Secondary | ICD-10-CM | POA: Diagnosis not present

## 2022-09-16 DIAGNOSIS — Z9181 History of falling: Secondary | ICD-10-CM | POA: Diagnosis not present

## 2022-09-16 DIAGNOSIS — F329 Major depressive disorder, single episode, unspecified: Secondary | ICD-10-CM | POA: Diagnosis not present

## 2022-09-20 DIAGNOSIS — E782 Mixed hyperlipidemia: Secondary | ICD-10-CM | POA: Diagnosis not present

## 2022-09-20 DIAGNOSIS — I110 Hypertensive heart disease with heart failure: Secondary | ICD-10-CM | POA: Diagnosis not present

## 2022-09-20 DIAGNOSIS — L918 Other hypertrophic disorders of the skin: Secondary | ICD-10-CM | POA: Diagnosis not present

## 2022-09-20 DIAGNOSIS — E559 Vitamin D deficiency, unspecified: Secondary | ICD-10-CM | POA: Diagnosis not present

## 2022-09-23 DIAGNOSIS — R262 Difficulty in walking, not elsewhere classified: Secondary | ICD-10-CM | POA: Diagnosis not present

## 2022-09-23 DIAGNOSIS — I130 Hypertensive heart and chronic kidney disease with heart failure and stage 1 through stage 4 chronic kidney disease, or unspecified chronic kidney disease: Secondary | ICD-10-CM | POA: Diagnosis not present

## 2022-09-23 DIAGNOSIS — F329 Major depressive disorder, single episode, unspecified: Secondary | ICD-10-CM | POA: Diagnosis not present

## 2022-09-23 DIAGNOSIS — E782 Mixed hyperlipidemia: Secondary | ICD-10-CM | POA: Diagnosis not present

## 2022-09-23 DIAGNOSIS — M6281 Muscle weakness (generalized): Secondary | ICD-10-CM | POA: Diagnosis not present

## 2022-09-23 DIAGNOSIS — F419 Anxiety disorder, unspecified: Secondary | ICD-10-CM | POA: Diagnosis not present

## 2022-09-23 DIAGNOSIS — Z9181 History of falling: Secondary | ICD-10-CM | POA: Diagnosis not present

## 2022-09-23 DIAGNOSIS — E559 Vitamin D deficiency, unspecified: Secondary | ICD-10-CM | POA: Diagnosis not present

## 2022-09-23 DIAGNOSIS — N182 Chronic kidney disease, stage 2 (mild): Secondary | ICD-10-CM | POA: Diagnosis not present

## 2022-09-23 DIAGNOSIS — E039 Hypothyroidism, unspecified: Secondary | ICD-10-CM | POA: Diagnosis not present

## 2022-09-23 DIAGNOSIS — M810 Age-related osteoporosis without current pathological fracture: Secondary | ICD-10-CM | POA: Diagnosis not present

## 2022-09-23 DIAGNOSIS — M545 Low back pain, unspecified: Secondary | ICD-10-CM | POA: Diagnosis not present

## 2022-09-23 DIAGNOSIS — I509 Heart failure, unspecified: Secondary | ICD-10-CM | POA: Diagnosis not present

## 2022-09-25 DIAGNOSIS — E782 Mixed hyperlipidemia: Secondary | ICD-10-CM | POA: Diagnosis not present

## 2022-09-25 DIAGNOSIS — I10 Essential (primary) hypertension: Secondary | ICD-10-CM | POA: Diagnosis not present

## 2022-09-25 DIAGNOSIS — E039 Hypothyroidism, unspecified: Secondary | ICD-10-CM | POA: Diagnosis not present

## 2022-09-25 DIAGNOSIS — E559 Vitamin D deficiency, unspecified: Secondary | ICD-10-CM | POA: Diagnosis not present

## 2022-09-28 DIAGNOSIS — G894 Chronic pain syndrome: Secondary | ICD-10-CM | POA: Diagnosis not present

## 2022-09-28 DIAGNOSIS — F1120 Opioid dependence, uncomplicated: Secondary | ICD-10-CM | POA: Diagnosis not present

## 2022-09-28 DIAGNOSIS — E559 Vitamin D deficiency, unspecified: Secondary | ICD-10-CM | POA: Diagnosis not present

## 2022-09-30 DIAGNOSIS — I509 Heart failure, unspecified: Secondary | ICD-10-CM | POA: Diagnosis not present

## 2022-09-30 DIAGNOSIS — I130 Hypertensive heart and chronic kidney disease with heart failure and stage 1 through stage 4 chronic kidney disease, or unspecified chronic kidney disease: Secondary | ICD-10-CM | POA: Diagnosis not present

## 2022-09-30 DIAGNOSIS — F329 Major depressive disorder, single episode, unspecified: Secondary | ICD-10-CM | POA: Diagnosis not present

## 2022-09-30 DIAGNOSIS — N182 Chronic kidney disease, stage 2 (mild): Secondary | ICD-10-CM | POA: Diagnosis not present

## 2022-09-30 DIAGNOSIS — R262 Difficulty in walking, not elsewhere classified: Secondary | ICD-10-CM | POA: Diagnosis not present

## 2022-09-30 DIAGNOSIS — M545 Low back pain, unspecified: Secondary | ICD-10-CM | POA: Diagnosis not present

## 2022-09-30 DIAGNOSIS — M810 Age-related osteoporosis without current pathological fracture: Secondary | ICD-10-CM | POA: Diagnosis not present

## 2022-09-30 DIAGNOSIS — E782 Mixed hyperlipidemia: Secondary | ICD-10-CM | POA: Diagnosis not present

## 2022-09-30 DIAGNOSIS — M6281 Muscle weakness (generalized): Secondary | ICD-10-CM | POA: Diagnosis not present

## 2022-09-30 DIAGNOSIS — Z9181 History of falling: Secondary | ICD-10-CM | POA: Diagnosis not present

## 2022-09-30 DIAGNOSIS — F419 Anxiety disorder, unspecified: Secondary | ICD-10-CM | POA: Diagnosis not present

## 2022-09-30 DIAGNOSIS — E039 Hypothyroidism, unspecified: Secondary | ICD-10-CM | POA: Diagnosis not present

## 2022-09-30 DIAGNOSIS — E559 Vitamin D deficiency, unspecified: Secondary | ICD-10-CM | POA: Diagnosis not present

## 2022-10-05 DIAGNOSIS — Z9181 History of falling: Secondary | ICD-10-CM | POA: Diagnosis not present

## 2022-10-05 DIAGNOSIS — N182 Chronic kidney disease, stage 2 (mild): Secondary | ICD-10-CM | POA: Diagnosis not present

## 2022-10-05 DIAGNOSIS — M810 Age-related osteoporosis without current pathological fracture: Secondary | ICD-10-CM | POA: Diagnosis not present

## 2022-10-05 DIAGNOSIS — F329 Major depressive disorder, single episode, unspecified: Secondary | ICD-10-CM | POA: Diagnosis not present

## 2022-10-05 DIAGNOSIS — E782 Mixed hyperlipidemia: Secondary | ICD-10-CM | POA: Diagnosis not present

## 2022-10-05 DIAGNOSIS — M545 Low back pain, unspecified: Secondary | ICD-10-CM | POA: Diagnosis not present

## 2022-10-05 DIAGNOSIS — E039 Hypothyroidism, unspecified: Secondary | ICD-10-CM | POA: Diagnosis not present

## 2022-10-05 DIAGNOSIS — M6281 Muscle weakness (generalized): Secondary | ICD-10-CM | POA: Diagnosis not present

## 2022-10-05 DIAGNOSIS — I509 Heart failure, unspecified: Secondary | ICD-10-CM | POA: Diagnosis not present

## 2022-10-05 DIAGNOSIS — I130 Hypertensive heart and chronic kidney disease with heart failure and stage 1 through stage 4 chronic kidney disease, or unspecified chronic kidney disease: Secondary | ICD-10-CM | POA: Diagnosis not present

## 2022-10-05 DIAGNOSIS — E559 Vitamin D deficiency, unspecified: Secondary | ICD-10-CM | POA: Diagnosis not present

## 2022-10-05 DIAGNOSIS — F419 Anxiety disorder, unspecified: Secondary | ICD-10-CM | POA: Diagnosis not present

## 2022-10-05 DIAGNOSIS — R262 Difficulty in walking, not elsewhere classified: Secondary | ICD-10-CM | POA: Diagnosis not present

## 2022-10-06 DIAGNOSIS — C44519 Basal cell carcinoma of skin of other part of trunk: Secondary | ICD-10-CM | POA: Diagnosis not present

## 2022-10-09 ENCOUNTER — Encounter (HOSPITAL_COMMUNITY): Payer: Self-pay

## 2022-10-09 ENCOUNTER — Other Ambulatory Visit: Payer: Self-pay

## 2022-10-09 ENCOUNTER — Emergency Department (HOSPITAL_COMMUNITY): Payer: Medicare Other

## 2022-10-09 ENCOUNTER — Emergency Department (HOSPITAL_COMMUNITY)
Admission: EM | Admit: 2022-10-09 | Discharge: 2022-10-09 | Disposition: A | Discharge status: Home / Self Care | Payer: Medicare Other | Attending: Emergency Medicine | Admitting: Emergency Medicine

## 2022-10-09 DIAGNOSIS — I130 Hypertensive heart and chronic kidney disease with heart failure and stage 1 through stage 4 chronic kidney disease, or unspecified chronic kidney disease: Secondary | ICD-10-CM | POA: Diagnosis not present

## 2022-10-09 DIAGNOSIS — E559 Vitamin D deficiency, unspecified: Secondary | ICD-10-CM | POA: Diagnosis not present

## 2022-10-09 DIAGNOSIS — R0902 Hypoxemia: Secondary | ICD-10-CM | POA: Diagnosis not present

## 2022-10-09 DIAGNOSIS — Z9181 History of falling: Secondary | ICD-10-CM | POA: Diagnosis not present

## 2022-10-09 DIAGNOSIS — S0003XA Contusion of scalp, initial encounter: Secondary | ICD-10-CM | POA: Diagnosis not present

## 2022-10-09 DIAGNOSIS — I10 Essential (primary) hypertension: Secondary | ICD-10-CM | POA: Diagnosis not present

## 2022-10-09 DIAGNOSIS — F329 Major depressive disorder, single episode, unspecified: Secondary | ICD-10-CM | POA: Diagnosis not present

## 2022-10-09 DIAGNOSIS — M810 Age-related osteoporosis without current pathological fracture: Secondary | ICD-10-CM | POA: Diagnosis not present

## 2022-10-09 DIAGNOSIS — I509 Heart failure, unspecified: Secondary | ICD-10-CM | POA: Diagnosis not present

## 2022-10-09 DIAGNOSIS — E782 Mixed hyperlipidemia: Secondary | ICD-10-CM | POA: Diagnosis not present

## 2022-10-09 DIAGNOSIS — M6281 Muscle weakness (generalized): Secondary | ICD-10-CM | POA: Diagnosis not present

## 2022-10-09 DIAGNOSIS — S0990XA Unspecified injury of head, initial encounter: Secondary | ICD-10-CM | POA: Diagnosis not present

## 2022-10-09 DIAGNOSIS — W19XXXA Unspecified fall, initial encounter: Secondary | ICD-10-CM | POA: Insufficient documentation

## 2022-10-09 DIAGNOSIS — F419 Anxiety disorder, unspecified: Secondary | ICD-10-CM | POA: Diagnosis not present

## 2022-10-09 DIAGNOSIS — E039 Hypothyroidism, unspecified: Secondary | ICD-10-CM | POA: Diagnosis not present

## 2022-10-09 DIAGNOSIS — N182 Chronic kidney disease, stage 2 (mild): Secondary | ICD-10-CM | POA: Diagnosis not present

## 2022-10-09 DIAGNOSIS — M545 Low back pain, unspecified: Secondary | ICD-10-CM | POA: Diagnosis not present

## 2022-10-09 DIAGNOSIS — R262 Difficulty in walking, not elsewhere classified: Secondary | ICD-10-CM | POA: Diagnosis not present

## 2022-10-09 NOTE — Discharge Instructions (Signed)
Your CT scan shows no signs of bleeding around the brain or fractures, you can follow-up with your doctor as needed, Tylenol as needed for pain up to 500 mg every 6 hours

## 2022-10-09 NOTE — ED Provider Notes (Signed)
[facility] EMERGENCY DEPARTMENT AT [facility] Provider Note   CSN: [id number] Arrival date & time: 10/09/22  1201     History  Chief Complaint  Patient presents with   Head Injury    Catherine West is a 89 y.o. female.   Head Injury  This patient is a 89 year old female, she is not anticoagulated, lives in [place] living, was walking to the bathroom, fell backward striking her head on the bedside table.  No loss of consciousness, she actually has no complaints except for tenderness over the scalp, no loss of consciousness, no vomiting, no seizure, no chest pain or shortness of breath.  No swelling of the legs.  Arrives by EMS transport    Home Medications Prior to Admission medications   Medication Sig Start Date End Date Taking? Authorizing Provider  amLODipine (NORVASC) 5 MG tablet Take 1 tablet (5 mg total) by mouth daily. 09/08/22 10/08/22  Sherryll Burger, Pratik D, DO  furosemide (LASIX) 20 MG tablet Take 1 tablet (20 mg total) by mouth daily as needed for fluid or edema. 09/07/22 09/07/23  Sherryll Burger, Pratik D, DO  HYDROcodone-acetaminophen (NORCO/VICODIN) 5-325 MG tablet Take 1 tablet by mouth daily as needed for moderate pain or severe pain. 09/07/22   Sherryll Burger, Pratik D, DO  losartan (COZAAR) 100 MG tablet Take 1 tablet by mouth daily. Patient not taking: Reported on 09/04/2022 01/15/19   [provider]  sertraline (ZOLOFT) 100 MG tablet Take 100 mg by mouth daily.    [provider]  traMADol (ULTRAM) 50 MG tablet Take 1 tablet every 6 hours by oral route for 7 days. 09/07/22   Maurilio Lovely D, DO      Allergies    Patient has no known allergies.    Review of Systems   Review of Systems  All other systems reviewed and are negative.   Physical Exam Updated Vital Signs Ht 1.626 m (5\' 4" )   Wt 59 kg   BMI 22.33 kg/m  Physical Exam Vitals and nursing note reviewed.  Constitutional:      General: She is not in acute distress.    Appearance: She is  well-developed.  HENT:     Head: Normocephalic.     Comments: Several centimeter in diameter hematoma to the posterior occiput, no depression felt over the skull, no tenderness over the cervical spine    Mouth/Throat:     Pharynx: No oropharyngeal exudate.  Eyes:     General: No scleral icterus.       Right eye: No discharge.        Left eye: No discharge.     Conjunctiva/sclera: Conjunctivae normal.     Pupils: Pupils are equal, round, and reactive to light.  Neck:     Thyroid: No thyromegaly.     Vascular: No JVD.  Cardiovascular:     Rate and Rhythm: Normal rate and regular rhythm.     Heart sounds: Normal heart sounds. No murmur heard.    No friction rub. No gallop.  Pulmonary:     Effort: Pulmonary effort is normal. No respiratory distress.     Breath sounds: Wheezing present. No rales.     Comments: Minimal expiratory wheeze, speaks in full sentences, no distress, oxygen of 94% on room air Abdominal:     General: Bowel sounds are normal. There is no distension.     Palpations: Abdomen is soft. There is no mass.     Tenderness: There is no abdominal tenderness.  Musculoskeletal:        General: No tenderness. Normal range of motion.     Cervical back: Normal range of motion and neck supple.     Right lower leg: No edema.     Left lower leg: No edema.  Lymphadenopathy:     Cervical: No cervical adenopathy.  Skin:    General: Skin is warm and dry.     Findings: No erythema or rash.  Neurological:     Mental Status: She is alert.     Coordination: Coordination normal.     Comments: Alert awake and able to follow commands, she is completely oriented, she has no memory loss, she has no dysmetria or ataxia  Psychiatric:        Behavior: Behavior normal.     ED Results / Procedures / Treatments   Labs (all labs ordered are listed, but only abnormal results are displayed) Labs Reviewed - No data to display  EKG None  Radiology CT Head Wo Contrast  Result Date:  10/09/2022 CLINICAL DATA:  Flank poly trauma.  Hit head against nightstand EXAM: CT HEAD WITHOUT CONTRAST TECHNIQUE: Contiguous axial images were obtained from the base of the skull through the vertex without intravenous contrast. RADIATION DOSE REDUCTION: This exam was performed according to the departmental dose-optimization program which includes automated exposure control, adjustment of the mA and/or kV according to patient size and/or use of iterative reconstruction technique. COMPARISON:  11/04/2019 FINDINGS: Brain: No evidence of acute infarction, hemorrhage, hydrocephalus, extra-axial collection or mass lesion/mass effect. Generalized brain atrophy and confluent chronic small vessel ischemia in the hemispheric white matter. Vascular: No hyperdense vessel or unexpected calcification. Skull: Normal. Negative for fracture or focal lesion. Sinuses/Orbits: No acute finding. IMPRESSION: Aging brain without acute or reversible finding. Electronically Signed   By: Tiburcio Pea M.D.   On: 10/09/2022 12:45    Procedures Procedures    Medications Ordered in ED Medications - No data to display  ED Course/ Medical Decision Making/ A&P                             Medical Decision Making Amount and/or Complexity of Data Reviewed Radiology: ordered.    This patient presents to the ED for concern of fall with head injury differential diagnosis includes subdural hematoma, contusion, minor head injury, concussion    Additional history obtained:  Additional history obtained from medical record External records from outside source obtained and reviewed including admission to the hospital in the last couple of months because of congestive heart failure, not having any of those symptoms at this time   Lab Tests:  I Ordered, and personally interpreted labs.  The pertinent results include: Not indicated   Imaging Studies ordered:  I ordered imaging studies including CT scan of the brain without  contrast I independently visualized and interpreted imaging which showed no signs of acute intracranial hemorrhage or complicating factors I agree with the radiologist interpretation   Medicines ordered and prescription drug management:  I ordered medication including Tylenol for home I have reviewed the patients home medicines and have made adjustments as needed   Problem List / ED Course:  Minor head injury, no subdural hematoma   Social Determinants of Health:   I have discussed with the patient at the bedside the results, and the meaning of these results.  They have expressed her understanding to the need for follow-up with primary care physician  Final Clinical Impression(s) / ED Diagnoses Final diagnoses:  Minor head injury, initial encounter    Rx / DC Orders ED Discharge Orders     None         Eber Hong, MD 10/09/22 1251

## 2022-10-09 NOTE — ED Notes (Signed)
Pt alert and oriented at baseline. Pt ambulatory status is at base line. Pt family offered to transport patient back to [place].

## 2022-10-09 NOTE — ED Notes (Signed)
Pt escorted to discharge vehicle with son driving. Family had no questions at time of discharge. Report given to facility and alert that pt is being transported back at this time.

## 2022-10-09 NOTE — ED Notes (Signed)
Discharge pending due to transportation back to facility.

## 2022-10-09 NOTE — ED Notes (Signed)
Report given and accepted by facility.

## 2022-10-09 NOTE — ED Triage Notes (Signed)
Pt reports she tripped and hit the back of her head on a nightstand.  Pt denies any other injury, pt states she feels "fine".

## 2022-10-11 DIAGNOSIS — S0003XA Contusion of scalp, initial encounter: Secondary | ICD-10-CM | POA: Diagnosis not present

## 2022-10-11 DIAGNOSIS — I10 Essential (primary) hypertension: Secondary | ICD-10-CM | POA: Diagnosis not present

## 2022-10-11 DIAGNOSIS — S0990XA Unspecified injury of head, initial encounter: Secondary | ICD-10-CM | POA: Diagnosis not present

## 2022-10-11 DIAGNOSIS — E785 Hyperlipidemia, unspecified: Secondary | ICD-10-CM | POA: Diagnosis not present

## 2022-10-11 DIAGNOSIS — W010XXA Fall on same level from slipping, tripping and stumbling without subsequent striking against object, initial encounter: Secondary | ICD-10-CM | POA: Diagnosis not present

## 2022-10-11 DIAGNOSIS — I509 Heart failure, unspecified: Secondary | ICD-10-CM | POA: Diagnosis not present

## 2022-10-11 DIAGNOSIS — F329 Major depressive disorder, single episode, unspecified: Secondary | ICD-10-CM | POA: Diagnosis not present

## 2022-10-12 DIAGNOSIS — E559 Vitamin D deficiency, unspecified: Secondary | ICD-10-CM | POA: Diagnosis not present

## 2022-10-12 DIAGNOSIS — F419 Anxiety disorder, unspecified: Secondary | ICD-10-CM | POA: Diagnosis not present

## 2022-10-12 DIAGNOSIS — I509 Heart failure, unspecified: Secondary | ICD-10-CM | POA: Diagnosis not present

## 2022-10-12 DIAGNOSIS — R262 Difficulty in walking, not elsewhere classified: Secondary | ICD-10-CM | POA: Diagnosis not present

## 2022-10-12 DIAGNOSIS — F329 Major depressive disorder, single episode, unspecified: Secondary | ICD-10-CM | POA: Diagnosis not present

## 2022-10-12 DIAGNOSIS — Z9181 History of falling: Secondary | ICD-10-CM | POA: Diagnosis not present

## 2022-10-12 DIAGNOSIS — M545 Low back pain, unspecified: Secondary | ICD-10-CM | POA: Diagnosis not present

## 2022-10-12 DIAGNOSIS — M6281 Muscle weakness (generalized): Secondary | ICD-10-CM | POA: Diagnosis not present

## 2022-10-12 DIAGNOSIS — E039 Hypothyroidism, unspecified: Secondary | ICD-10-CM | POA: Diagnosis not present

## 2022-10-12 DIAGNOSIS — I130 Hypertensive heart and chronic kidney disease with heart failure and stage 1 through stage 4 chronic kidney disease, or unspecified chronic kidney disease: Secondary | ICD-10-CM | POA: Diagnosis not present

## 2022-10-12 DIAGNOSIS — E782 Mixed hyperlipidemia: Secondary | ICD-10-CM | POA: Diagnosis not present

## 2022-10-12 DIAGNOSIS — M810 Age-related osteoporosis without current pathological fracture: Secondary | ICD-10-CM | POA: Diagnosis not present

## 2022-10-12 DIAGNOSIS — N182 Chronic kidney disease, stage 2 (mild): Secondary | ICD-10-CM | POA: Diagnosis not present

## 2022-10-13 DIAGNOSIS — I130 Hypertensive heart and chronic kidney disease with heart failure and stage 1 through stage 4 chronic kidney disease, or unspecified chronic kidney disease: Secondary | ICD-10-CM | POA: Diagnosis not present

## 2022-10-13 DIAGNOSIS — I509 Heart failure, unspecified: Secondary | ICD-10-CM | POA: Diagnosis not present

## 2022-10-13 DIAGNOSIS — N182 Chronic kidney disease, stage 2 (mild): Secondary | ICD-10-CM | POA: Diagnosis not present

## 2022-10-14 DIAGNOSIS — I509 Heart failure, unspecified: Secondary | ICD-10-CM | POA: Diagnosis not present

## 2022-10-14 DIAGNOSIS — F329 Major depressive disorder, single episode, unspecified: Secondary | ICD-10-CM | POA: Diagnosis not present

## 2022-10-14 DIAGNOSIS — E785 Hyperlipidemia, unspecified: Secondary | ICD-10-CM | POA: Diagnosis not present

## 2022-10-14 DIAGNOSIS — M810 Age-related osteoporosis without current pathological fracture: Secondary | ICD-10-CM | POA: Diagnosis not present

## 2022-10-14 DIAGNOSIS — M8580 Other specified disorders of bone density and structure, unspecified site: Secondary | ICD-10-CM | POA: Diagnosis not present

## 2022-10-14 DIAGNOSIS — F419 Anxiety disorder, unspecified: Secondary | ICD-10-CM | POA: Diagnosis not present

## 2022-10-14 DIAGNOSIS — N182 Chronic kidney disease, stage 2 (mild): Secondary | ICD-10-CM | POA: Diagnosis not present

## 2022-10-14 DIAGNOSIS — E039 Hypothyroidism, unspecified: Secondary | ICD-10-CM | POA: Diagnosis not present

## 2022-10-14 DIAGNOSIS — I130 Hypertensive heart and chronic kidney disease with heart failure and stage 1 through stage 4 chronic kidney disease, or unspecified chronic kidney disease: Secondary | ICD-10-CM | POA: Diagnosis not present

## 2022-10-14 DIAGNOSIS — E559 Vitamin D deficiency, unspecified: Secondary | ICD-10-CM | POA: Diagnosis not present

## 2022-10-14 DIAGNOSIS — Z9181 History of falling: Secondary | ICD-10-CM | POA: Diagnosis not present

## 2022-10-15 ENCOUNTER — Other Ambulatory Visit: Payer: Self-pay | Admitting: *Deleted

## 2022-10-15 DIAGNOSIS — C44519 Basal cell carcinoma of skin of other part of trunk: Secondary | ICD-10-CM

## 2022-10-18 DIAGNOSIS — E559 Vitamin D deficiency, unspecified: Secondary | ICD-10-CM | POA: Diagnosis not present

## 2022-10-18 DIAGNOSIS — Z9181 History of falling: Secondary | ICD-10-CM | POA: Diagnosis not present

## 2022-10-18 DIAGNOSIS — M810 Age-related osteoporosis without current pathological fracture: Secondary | ICD-10-CM | POA: Diagnosis not present

## 2022-10-18 DIAGNOSIS — I509 Heart failure, unspecified: Secondary | ICD-10-CM | POA: Diagnosis not present

## 2022-10-18 DIAGNOSIS — I130 Hypertensive heart and chronic kidney disease with heart failure and stage 1 through stage 4 chronic kidney disease, or unspecified chronic kidney disease: Secondary | ICD-10-CM | POA: Diagnosis not present

## 2022-10-18 DIAGNOSIS — N182 Chronic kidney disease, stage 2 (mild): Secondary | ICD-10-CM | POA: Diagnosis not present

## 2022-10-18 DIAGNOSIS — E039 Hypothyroidism, unspecified: Secondary | ICD-10-CM | POA: Diagnosis not present

## 2022-10-18 DIAGNOSIS — M6281 Muscle weakness (generalized): Secondary | ICD-10-CM | POA: Diagnosis not present

## 2022-10-18 DIAGNOSIS — F329 Major depressive disorder, single episode, unspecified: Secondary | ICD-10-CM | POA: Diagnosis not present

## 2022-10-18 DIAGNOSIS — E782 Mixed hyperlipidemia: Secondary | ICD-10-CM | POA: Diagnosis not present

## 2022-10-18 DIAGNOSIS — R262 Difficulty in walking, not elsewhere classified: Secondary | ICD-10-CM | POA: Diagnosis not present

## 2022-10-18 DIAGNOSIS — M545 Low back pain, unspecified: Secondary | ICD-10-CM | POA: Diagnosis not present

## 2022-10-18 DIAGNOSIS — F419 Anxiety disorder, unspecified: Secondary | ICD-10-CM | POA: Diagnosis not present

## 2022-10-19 DIAGNOSIS — E559 Vitamin D deficiency, unspecified: Secondary | ICD-10-CM | POA: Diagnosis not present

## 2022-10-19 DIAGNOSIS — I130 Hypertensive heart and chronic kidney disease with heart failure and stage 1 through stage 4 chronic kidney disease, or unspecified chronic kidney disease: Secondary | ICD-10-CM | POA: Diagnosis not present

## 2022-10-19 DIAGNOSIS — Z9181 History of falling: Secondary | ICD-10-CM | POA: Diagnosis not present

## 2022-10-19 DIAGNOSIS — I509 Heart failure, unspecified: Secondary | ICD-10-CM | POA: Diagnosis not present

## 2022-10-19 DIAGNOSIS — E039 Hypothyroidism, unspecified: Secondary | ICD-10-CM | POA: Diagnosis not present

## 2022-10-19 DIAGNOSIS — F329 Major depressive disorder, single episode, unspecified: Secondary | ICD-10-CM | POA: Diagnosis not present

## 2022-10-19 DIAGNOSIS — M810 Age-related osteoporosis without current pathological fracture: Secondary | ICD-10-CM | POA: Diagnosis not present

## 2022-10-19 DIAGNOSIS — M8580 Other specified disorders of bone density and structure, unspecified site: Secondary | ICD-10-CM | POA: Diagnosis not present

## 2022-10-19 DIAGNOSIS — E785 Hyperlipidemia, unspecified: Secondary | ICD-10-CM | POA: Diagnosis not present

## 2022-10-19 DIAGNOSIS — F419 Anxiety disorder, unspecified: Secondary | ICD-10-CM | POA: Diagnosis not present

## 2022-10-19 DIAGNOSIS — N182 Chronic kidney disease, stage 2 (mild): Secondary | ICD-10-CM | POA: Diagnosis not present

## 2022-10-21 ENCOUNTER — Encounter: Payer: Self-pay | Admitting: General Surgery

## 2022-10-21 ENCOUNTER — Ambulatory Visit: Payer: Medicare Other | Admitting: General Surgery

## 2022-10-21 ENCOUNTER — Other Ambulatory Visit: Payer: Self-pay | Admitting: *Deleted

## 2022-10-21 ENCOUNTER — Other Ambulatory Visit: Payer: Self-pay

## 2022-10-21 VITALS — BP 166/86 | HR 62 | Temp 98.5°F | Resp 20 | Ht 62.0 in | Wt 131.2 lb

## 2022-10-21 DIAGNOSIS — C44519 Basal cell carcinoma of skin of other part of trunk: Secondary | ICD-10-CM

## 2022-10-21 NOTE — Progress Notes (Signed)
Catherine West; [id number]; 04/16/1921   HPI Patient is a 89 year old white female who was referred to my care by Dr. Nita Sells of dermatology for evaluation and treatment of a basal cell carcinoma of the lower abdominal wall.  Patient states has been present for many years.  It is biopsy-proven to be basal cell carcinoma. Past Medical History:  Diagnosis Date   Acute bronchitis    Acute bronchitis    Chronic kidney disease, stage 2 (mild)    Chronic kidney disease, unspecified    Cough    Essential (primary) hypertension    HTN (hypertension)    Hyperlipidemia, unspecified    Hypertensive chronic kidney disease with stage 1 through stage 4 chronic kidney disease, or unspecified chronic kidney disease    Hypothyroidism, unspecified    Major depressive disorder, single episode, unspecified    Osteoporosis    Osteoporosis    Pneumonia    Pneumonia    Urinary tract infection    Urinary tract infection     History reviewed. No pertinent surgical history.  Family History  Problem Relation Age of Onset   Other Father        brain tumor, inoperable   Emphysema Sister        smoker   Stroke Neg Hx    Parkinson's disease Neg Hx    Dementia Neg Hx     Current Outpatient Medications on File Prior to Visit  Medication Sig Dispense Refill   amLODipine (NORVASC) 5 MG tablet Take 1 tablet (5 mg total) by mouth daily. 30 tablet 0   Cholecalciferol (VITAMIN D) 50 MCG (2000 UT) CAPS Take 1 capsule by mouth daily.     furosemide (LASIX) 20 MG tablet Take 1 tablet (20 mg total) by mouth daily as needed for fluid or edema. 30 tablet 2   HYDROcodone-acetaminophen (NORCO/VICODIN) 5-325 MG tablet Take 1 tablet by mouth daily as needed for moderate pain or severe pain. (Patient taking differently: Take 1 tablet by mouth 3 (three) times daily as needed for moderate pain or severe pain.) 10 tablet 0   loperamide (IMODIUM A-D) 2 MG tablet Take 2 mg by mouth 4 (four) times daily as needed for  diarrhea or loose stools.     losartan (COZAAR) 100 MG tablet Take 1 tablet by mouth daily.     sertraline (ZOLOFT) 100 MG tablet Take 100 mg by mouth daily.     No current facility-administered medications on file prior to visit.    No Known Allergies  Social History   Substance and Sexual Activity  Alcohol Use No    Social History   Tobacco Use  Smoking Status Never  Smokeless Tobacco Never    Review of Systems  Constitutional: Negative.   HENT: Negative.    Eyes: Negative.   Respiratory: Negative.    Cardiovascular: Negative.   Gastrointestinal: Negative.   Genitourinary: Negative.   Musculoskeletal: Negative.   Skin: Negative.   Neurological: Negative.   Endo/Heme/Allergies: Negative.   Psychiatric/Behavioral: Negative.      Objective   Vitals:   10/21/22 1156  BP: (!) 166/86  Pulse: 62  Resp: 20  Temp: 98.5 F (36.9 C)  SpO2: 93%    Physical Exam Vitals reviewed.  Constitutional:      Appearance: Normal appearance. She is normal weight. She is not ill-appearing.  HENT:     Head: Normocephalic and atraumatic.  Cardiovascular:     Rate and Rhythm: Normal rate and regular rhythm.  Heart sounds: Normal heart sounds. No murmur heard.    No friction rub. No gallop.  Pulmonary:     Effort: Pulmonary effort is normal. No respiratory distress.     Breath sounds: Normal breath sounds. No stridor. No wheezing, rhonchi or rales.  Skin:    General: Skin is warm and dry.     Comments: A 5 to 6 cm linear raised pink lesion is present on the left lower quadrant of abdominal wall.  It is not fixed to the muscle.  It appears to be healing subcutaneous in nature.  Neurological:     Mental Status: She is alert and oriented to person, place, and time.      Media Information  Document Information  Photos    10/21/2022 12:06  Attached To:  Office Visit on 10/21/22 with [name], MD  Source Information  [name], MD  [facility]    Assessment  Basal cell carcinoma, left lower abdominal wall Plan  Patient is scheduled for excision of the basal cell carcinoma, abdominal wall on 11/10/2022.  The risks and benefits of the procedure were fully explained to the patient, who gave informed consent.  This will be done primarily under local anesthesia with some monitored anesthesia care.

## 2022-10-21 NOTE — H&P (Signed)
Catherine West; [id number]; 05/09/1920   HPI Patient is a 89-year-old white female who was referred to my care by Dr. John Hall of dermatology for evaluation and treatment of a basal cell carcinoma of the lower abdominal wall.  Patient states has been present for many years.  It is biopsy-proven to be basal cell carcinoma. Past Medical History:  Diagnosis Date   Acute bronchitis    Acute bronchitis    Chronic kidney disease, stage 2 (mild)    Chronic kidney disease, unspecified    Cough    Essential (primary) hypertension    HTN (hypertension)    Hyperlipidemia, unspecified    Hypertensive chronic kidney disease with stage 1 through stage 4 chronic kidney disease, or unspecified chronic kidney disease    Hypothyroidism, unspecified    Major depressive disorder, single episode, unspecified    Osteoporosis    Osteoporosis    Pneumonia    Pneumonia    Urinary tract infection    Urinary tract infection     History reviewed. No pertinent surgical history.  Family History  Problem Relation Age of Onset   Other Father        brain tumor, inoperable   Emphysema Sister        smoker   Stroke Neg Hx    Parkinson's disease Neg Hx    Dementia Neg Hx     Current Outpatient Medications on File Prior to Visit  Medication Sig Dispense Refill   amLODipine (NORVASC) 5 MG tablet Take 1 tablet (5 mg total) by mouth daily. 30 tablet 0   Cholecalciferol (VITAMIN D) 50 MCG (2000 UT) CAPS Take 1 capsule by mouth daily.     furosemide (LASIX) 20 MG tablet Take 1 tablet (20 mg total) by mouth daily as needed for fluid or edema. 30 tablet 2   HYDROcodone-acetaminophen (NORCO/VICODIN) 5-325 MG tablet Take 1 tablet by mouth daily as needed for moderate pain or severe pain. (Patient taking differently: Take 1 tablet by mouth 3 (three) times daily as needed for moderate pain or severe pain.) 10 tablet 0   loperamide (IMODIUM A-D) 2 MG tablet Take 2 mg by mouth 4 (four) times daily as needed for  diarrhea or loose stools.     losartan (COZAAR) 100 MG tablet Take 1 tablet by mouth daily.     sertraline (ZOLOFT) 100 MG tablet Take 100 mg by mouth daily.     No current facility-administered medications on file prior to visit.    No Known Allergies  Social History   Substance and Sexual Activity  Alcohol Use No    Social History   Tobacco Use  Smoking Status Never  Smokeless Tobacco Never    Review of Systems  Constitutional: Negative.   HENT: Negative.    Eyes: Negative.   Respiratory: Negative.    Cardiovascular: Negative.   Gastrointestinal: Negative.   Genitourinary: Negative.   Musculoskeletal: Negative.   Skin: Negative.   Neurological: Negative.   Endo/Heme/Allergies: Negative.   Psychiatric/Behavioral: Negative.      Objective   Vitals:   10/21/22 1156  BP: (!) 166/86  Pulse: 62  Resp: 20  Temp: 98.5 F (36.9 C)  SpO2: 93%    Physical Exam Vitals reviewed.  Constitutional:      Appearance: Normal appearance. She is normal weight. She is not ill-appearing.  HENT:     Head: Normocephalic and atraumatic.  Cardiovascular:     Rate and Rhythm: Normal rate and regular rhythm.       Heart sounds: Normal heart sounds. No murmur heard.    No friction rub. No gallop.  Pulmonary:     Effort: Pulmonary effort is normal. No respiratory distress.     Breath sounds: Normal breath sounds. No stridor. No wheezing, rhonchi or rales.  Skin:    General: Skin is warm and dry.     Comments: A 5 to 6 cm linear raised pink lesion is present on the left lower quadrant of abdominal wall.  It is not fixed to the muscle.  It appears to be healing subcutaneous in nature.  Neurological:     Mental Status: She is alert and oriented to person, place, and time.      Media Information  Document Information  Photos    10/21/2022 12:06  Attached To:  Office Visit on 10/21/22 with Dierdre Mccalip, MD  Source Information  Kahliyah Dick, MD  [facility]    Assessment  Basal cell carcinoma, left lower abdominal wall Plan  Patient is scheduled for excision of the basal cell carcinoma, abdominal wall on 11/10/2022.  The risks and benefits of the procedure were fully explained to the patient, who gave informed consent.  This will be done primarily under local anesthesia with some monitored anesthesia care. 

## 2022-10-25 DIAGNOSIS — F329 Major depressive disorder, single episode, unspecified: Secondary | ICD-10-CM | POA: Diagnosis not present

## 2022-10-25 DIAGNOSIS — E039 Hypothyroidism, unspecified: Secondary | ICD-10-CM | POA: Diagnosis not present

## 2022-10-25 DIAGNOSIS — F419 Anxiety disorder, unspecified: Secondary | ICD-10-CM | POA: Diagnosis not present

## 2022-10-25 DIAGNOSIS — E559 Vitamin D deficiency, unspecified: Secondary | ICD-10-CM | POA: Diagnosis not present

## 2022-10-25 DIAGNOSIS — I130 Hypertensive heart and chronic kidney disease with heart failure and stage 1 through stage 4 chronic kidney disease, or unspecified chronic kidney disease: Secondary | ICD-10-CM | POA: Diagnosis not present

## 2022-10-25 DIAGNOSIS — F1120 Opioid dependence, uncomplicated: Secondary | ICD-10-CM | POA: Diagnosis not present

## 2022-10-25 DIAGNOSIS — I509 Heart failure, unspecified: Secondary | ICD-10-CM | POA: Diagnosis not present

## 2022-10-25 DIAGNOSIS — E782 Mixed hyperlipidemia: Secondary | ICD-10-CM | POA: Diagnosis not present

## 2022-10-25 DIAGNOSIS — R262 Difficulty in walking, not elsewhere classified: Secondary | ICD-10-CM | POA: Diagnosis not present

## 2022-10-25 DIAGNOSIS — G894 Chronic pain syndrome: Secondary | ICD-10-CM | POA: Diagnosis not present

## 2022-10-25 DIAGNOSIS — Z9181 History of falling: Secondary | ICD-10-CM | POA: Diagnosis not present

## 2022-10-25 DIAGNOSIS — M545 Low back pain, unspecified: Secondary | ICD-10-CM | POA: Diagnosis not present

## 2022-10-25 DIAGNOSIS — M810 Age-related osteoporosis without current pathological fracture: Secondary | ICD-10-CM | POA: Diagnosis not present

## 2022-10-25 DIAGNOSIS — N182 Chronic kidney disease, stage 2 (mild): Secondary | ICD-10-CM | POA: Diagnosis not present

## 2022-10-25 DIAGNOSIS — M6281 Muscle weakness (generalized): Secondary | ICD-10-CM | POA: Diagnosis not present

## 2022-10-29 DIAGNOSIS — F329 Major depressive disorder, single episode, unspecified: Secondary | ICD-10-CM | POA: Diagnosis not present

## 2022-10-29 DIAGNOSIS — I10 Essential (primary) hypertension: Secondary | ICD-10-CM | POA: Diagnosis not present

## 2022-10-29 DIAGNOSIS — F419 Anxiety disorder, unspecified: Secondary | ICD-10-CM | POA: Diagnosis not present

## 2022-10-29 DIAGNOSIS — N189 Chronic kidney disease, unspecified: Secondary | ICD-10-CM | POA: Diagnosis not present

## 2022-10-29 DIAGNOSIS — E559 Vitamin D deficiency, unspecified: Secondary | ICD-10-CM | POA: Diagnosis not present

## 2022-10-29 DIAGNOSIS — E7800 Pure hypercholesterolemia, unspecified: Secondary | ICD-10-CM | POA: Diagnosis not present

## 2022-11-01 DIAGNOSIS — E559 Vitamin D deficiency, unspecified: Secondary | ICD-10-CM | POA: Diagnosis not present

## 2022-11-01 DIAGNOSIS — M545 Low back pain, unspecified: Secondary | ICD-10-CM | POA: Diagnosis not present

## 2022-11-01 DIAGNOSIS — F331 Major depressive disorder, recurrent, moderate: Secondary | ICD-10-CM | POA: Diagnosis not present

## 2022-11-03 DIAGNOSIS — E559 Vitamin D deficiency, unspecified: Secondary | ICD-10-CM | POA: Diagnosis not present

## 2022-11-03 DIAGNOSIS — N182 Chronic kidney disease, stage 2 (mild): Secondary | ICD-10-CM | POA: Diagnosis not present

## 2022-11-03 DIAGNOSIS — Z9181 History of falling: Secondary | ICD-10-CM | POA: Diagnosis not present

## 2022-11-03 DIAGNOSIS — M6281 Muscle weakness (generalized): Secondary | ICD-10-CM | POA: Diagnosis not present

## 2022-11-03 DIAGNOSIS — E782 Mixed hyperlipidemia: Secondary | ICD-10-CM | POA: Diagnosis not present

## 2022-11-03 DIAGNOSIS — R262 Difficulty in walking, not elsewhere classified: Secondary | ICD-10-CM | POA: Diagnosis not present

## 2022-11-03 DIAGNOSIS — M810 Age-related osteoporosis without current pathological fracture: Secondary | ICD-10-CM | POA: Diagnosis not present

## 2022-11-03 DIAGNOSIS — F329 Major depressive disorder, single episode, unspecified: Secondary | ICD-10-CM | POA: Diagnosis not present

## 2022-11-03 DIAGNOSIS — E039 Hypothyroidism, unspecified: Secondary | ICD-10-CM | POA: Diagnosis not present

## 2022-11-03 DIAGNOSIS — F419 Anxiety disorder, unspecified: Secondary | ICD-10-CM | POA: Diagnosis not present

## 2022-11-03 DIAGNOSIS — I130 Hypertensive heart and chronic kidney disease with heart failure and stage 1 through stage 4 chronic kidney disease, or unspecified chronic kidney disease: Secondary | ICD-10-CM | POA: Diagnosis not present

## 2022-11-03 DIAGNOSIS — I509 Heart failure, unspecified: Secondary | ICD-10-CM | POA: Diagnosis not present

## 2022-11-03 DIAGNOSIS — M545 Low back pain, unspecified: Secondary | ICD-10-CM | POA: Diagnosis not present

## 2022-11-09 ENCOUNTER — Encounter (HOSPITAL_COMMUNITY): Payer: Self-pay

## 2022-11-09 ENCOUNTER — Encounter (HOSPITAL_COMMUNITY)
Admission: RE | Admit: 2022-11-09 | Discharge: 2022-11-09 | Disposition: A | Discharge status: Home / Self Care | Payer: Medicare Other | Source: Ambulatory Visit | Attending: General Surgery | Admitting: General Surgery

## 2022-11-10 ENCOUNTER — Ambulatory Visit (HOSPITAL_COMMUNITY)
Admission: RE | Admit: 2022-11-10 | Discharge: 2022-11-10 | Disposition: A | Discharge status: Home / Self Care | Payer: Medicare Other | Attending: General Surgery | Admitting: General Surgery

## 2022-11-10 ENCOUNTER — Encounter (HOSPITAL_COMMUNITY): Payer: Self-pay | Admitting: General Surgery

## 2022-11-10 ENCOUNTER — Encounter (HOSPITAL_COMMUNITY)
Admission: RE | Disposition: A | Discharge status: Home / Self Care | Payer: Self-pay | Source: Home / Self Care | Attending: General Surgery

## 2022-11-10 ENCOUNTER — Ambulatory Visit (HOSPITAL_COMMUNITY): Payer: Medicare Other | Admitting: Anesthesiology

## 2022-11-10 ENCOUNTER — Ambulatory Visit (HOSPITAL_BASED_OUTPATIENT_CLINIC_OR_DEPARTMENT_OTHER): Payer: Medicare Other | Admitting: Anesthesiology

## 2022-11-10 DIAGNOSIS — E785 Hyperlipidemia, unspecified: Secondary | ICD-10-CM | POA: Diagnosis not present

## 2022-11-10 DIAGNOSIS — E039 Hypothyroidism, unspecified: Secondary | ICD-10-CM | POA: Diagnosis not present

## 2022-11-10 DIAGNOSIS — C44519 Basal cell carcinoma of skin of other part of trunk: Secondary | ICD-10-CM | POA: Diagnosis not present

## 2022-11-10 DIAGNOSIS — I10 Essential (primary) hypertension: Secondary | ICD-10-CM | POA: Diagnosis not present

## 2022-11-10 DIAGNOSIS — I509 Heart failure, unspecified: Secondary | ICD-10-CM | POA: Diagnosis not present

## 2022-11-10 DIAGNOSIS — I130 Hypertensive heart and chronic kidney disease with heart failure and stage 1 through stage 4 chronic kidney disease, or unspecified chronic kidney disease: Secondary | ICD-10-CM | POA: Diagnosis not present

## 2022-11-10 DIAGNOSIS — N184 Chronic kidney disease, stage 4 (severe): Secondary | ICD-10-CM | POA: Diagnosis not present

## 2022-11-10 DIAGNOSIS — F329 Major depressive disorder, single episode, unspecified: Secondary | ICD-10-CM | POA: Diagnosis not present

## 2022-11-10 HISTORY — PX: LESION REMOVAL: SHX5196

## 2022-11-10 SURGERY — WIDE EXCISION, LESION, UPPER EXTREMITY
Anesthesia: Monitor Anesthesia Care | Site: Abdomen | Laterality: Left

## 2022-11-10 MED ORDER — LIDOCAINE HCL (PF) 1 % IJ SOLN
INTRAMUSCULAR | Status: AC
  Filled 2022-11-10: qty 30

## 2022-11-10 MED ORDER — CEFAZOLIN SODIUM-DEXTROSE 2-4 GM/100ML-% IV SOLN
2.0000 g | INTRAVENOUS | Status: DC
  Filled 2022-11-10: qty 100

## 2022-11-10 MED ORDER — CHLORHEXIDINE GLUCONATE 0.12 % MT SOLN
15.0000 mL | Freq: Once | OROMUCOSAL | Status: AC
  Administered 2022-11-10: 15 mL via OROMUCOSAL

## 2022-11-10 MED ORDER — LIDOCAINE HCL (PF) 1 % IJ SOLN
INTRAMUSCULAR | Status: DC | PRN
  Administered 2022-11-10: 8 mL

## 2022-11-10 MED ORDER — ORAL CARE MOUTH RINSE: 15.0000 mL | Freq: Once | OROMUCOSAL | Status: AC

## 2022-11-10 MED ORDER — CHLORHEXIDINE GLUCONATE CLOTH 2 % EX PADS: 6.0000 | MEDICATED_PAD | Freq: Once | CUTANEOUS | Status: DC

## 2022-11-10 MED ORDER — PROPOFOL 10 MG/ML IV BOLUS
INTRAVENOUS | Status: AC
  Filled 2022-11-10: qty 20

## 2022-11-10 MED ORDER — LACTATED RINGERS IV SOLN: INTRAVENOUS | Status: DC

## 2022-11-10 MED ORDER — PROPOFOL 10 MG/ML IV BOLUS
INTRAVENOUS | Status: DC | PRN
  Administered 2022-11-10: 30 mg via INTRAVENOUS
  Administered 2022-11-10: 40 mg via INTRAVENOUS
  Administered 2022-11-10 (×2): 20 mg via INTRAVENOUS

## 2022-11-10 MED ORDER — FENTANYL CITRATE (PF) 100 MCG/2ML IJ SOLN
INTRAMUSCULAR | Status: AC
  Filled 2022-11-10: qty 2

## 2022-11-10 MED ORDER — FENTANYL CITRATE (PF) 100 MCG/2ML IJ SOLN
INTRAMUSCULAR | Status: DC | PRN
  Administered 2022-11-10: 25 ug via INTRAVENOUS

## 2022-11-10 MED ORDER — LIDOCAINE HCL (PF) 2 % IJ SOLN
INTRAMUSCULAR | Status: AC
  Filled 2022-11-10: qty 5

## 2022-11-10 MED ORDER — SODIUM CHLORIDE 0.9 % IR SOLN
Status: DC | PRN
  Administered 2022-11-10: 1

## 2022-11-10 SURGICAL SUPPLY — 31 items
ADH SKN CLS LQ APL DERMABOND (GAUZE/BANDAGES/DRESSINGS) ×1
APL PRP STRL LF DISP 70% ISPRP (MISCELLANEOUS) ×1
CHLORAPREP W/TINT 26 (MISCELLANEOUS) ×1 IMPLANT
CLOTH BEACON ORANGE TIMEOUT ST (SAFETY) ×1 IMPLANT
COVER LIGHT HANDLE STERIS (MISCELLANEOUS) ×2 IMPLANT
DERMABOND ADVANCED .7 DNX6 (GAUZE/BANDAGES/DRESSINGS) IMPLANT
ELECT REM PT RETURN 9FT ADLT (ELECTROSURGICAL) ×1
ELECTRODE REM PT RTRN 9FT ADLT (ELECTROSURGICAL) ×1 IMPLANT
GLOVE BIOGEL PI IND STRL 7.0 (GLOVE) ×2 IMPLANT
GLOVE SURG SS PI 7.5 STRL IVOR (GLOVE) ×2 IMPLANT
GOWN STRL REUS W/TWL LRG LVL3 (GOWN DISPOSABLE) ×2 IMPLANT
KIT TURNOVER KIT A (KITS) ×1 IMPLANT
MANIFOLD NEPTUNE II (INSTRUMENTS) ×1 IMPLANT
NDL HYPO 18GX1.5 BLUNT FILL (NEEDLE) IMPLANT
NDL HYPO 25X1 1.5 SAFETY (NEEDLE) ×1 IMPLANT
NEEDLE HYPO 18GX1.5 BLUNT FILL (NEEDLE) IMPLANT
NEEDLE HYPO 25X1 1.5 SAFETY (NEEDLE) ×1 IMPLANT
NS IRRIG 1000ML POUR BTL (IV SOLUTION) ×1 IMPLANT
PACK MINOR (CUSTOM PROCEDURE TRAY) ×1 IMPLANT
PACK PERI GYN (CUSTOM PROCEDURE TRAY) IMPLANT
PAD ARMBOARD 7.5X6 YLW CONV (MISCELLANEOUS) ×1 IMPLANT
POSITIONER HEAD 8X9X4 ADT (SOFTGOODS) ×1 IMPLANT
SET BASIN LINEN APH (SET/KITS/TRAYS/PACK) ×1 IMPLANT
SOL PREP PROV IODINE SCRUB 4OZ (MISCELLANEOUS) IMPLANT
STRIP CLOSURE SKIN 1/2X4 (GAUZE/BANDAGES/DRESSINGS) ×1 IMPLANT
SUT MNCRL AB 4-0 PS2 18 (SUTURE) ×1 IMPLANT
SUT PROLENE 3 0 PS 1 (SUTURE) IMPLANT
SUT VIC AB 3-0 SH 27 (SUTURE) ×1
SUT VIC AB 3-0 SH 27X BRD (SUTURE) IMPLANT
SYR BULB IRRIG 60ML STRL (SYRINGE) ×1 IMPLANT
SYR CONTROL 10ML LL (SYRINGE) ×1 IMPLANT

## 2022-11-10 NOTE — Op Note (Signed)
Patient:  Catherine West  DOB:  05-14-1920  MRN:  [id number]   Preop Diagnosis: Basal cell carcinoma, trunk, lower abdominal  Postop Diagnosis: Same  Procedure: Excision of malignant skin lesion, trunk  Surgeon: [name], MD  Anes: MAC  Indications: Patient is a 89 year old white female who presents for excision of a basal cell carcinoma of the left lower abdominal wall.  The risks and benefits of the procedure were fully explained to the patient, who gave informed consent.  Procedure note: The patient was placed in supine position.  After monitored anesthesia care was given, the left lower quadrant abdominal wall was prepped and draped using usual sterile technique with ChloraPrep.  Surgical site confirmation was performed.  1% Xylocaine was used for local anesthesia.  An elliptical incision was made around the malignant lesion.  A full-thickness excision was performed to the subcutaneous tissue using Bovie electrocautery.  A short suture was placed superiorly and a long suture placed laterally for orientation purposes.  The lesion measured greater than 6 cm in its diameter.  It was sent to pathology for further examination.  A bleeding was controlled using Bovie electrocautery.  The subcutaneous layer was reapproximated using a 3-0 Vicryl interrupted suture.  The skin was closed using a 4-0 Monocryl subcuticular suture.  Dermabond was applied.  All tape and needle counts were correct at the end of the procedure.  The patient was awakened and transferred to PACU in stable condition.  Complications: None  EBL: Minimal  Specimen: Basal cell carcinoma of skin, abdominal wall

## 2022-11-10 NOTE — Interval H&P Note (Signed)
History and Physical Interval Note:  11/10/2022 10:17 AM  Catherine West  has presented today for surgery, with the diagnosis of BASAL CELL CARCINOMA, ABDOMEN, GREATER THAN 4 CM.  The various methods of treatment have been discussed with the patient and family. After consideration of risks, benefits and other options for treatment, the patient has consented to  Procedure(s): LESION REMOVAL TRUNK (Left) as a surgical intervention.  The patient's history has been reviewed, patient examined, no change in status, stable for surgery.  I have reviewed the patient's chart and labs.  Questions were answered to the patient's satisfaction.     [name]

## 2022-11-10 NOTE — Anesthesia Preprocedure Evaluation (Signed)
Anesthesia Evaluation  Patient identified by MRN, date of birth, ID band Patient awake    Reviewed: Allergy & Precautions, H&P , NPO status , Patient's Chart, lab work & pertinent test results  Airway Mallampati: II  TM Distance: >3 FB Neck ROM: Full    Dental no notable dental hx.    Pulmonary pneumonia   Pulmonary exam normal breath sounds clear to auscultation       Cardiovascular METS (walks with walker): hypertension, Pt. on medications +CHF  Normal cardiovascular exam Rhythm:Regular Rate:Normal  1. Left ventricular ejection fraction, by estimation, is 40 to 45%. The  left ventricle has mildly decreased function. The left ventricle  demonstrates global hypokinesis. There is mild left ventricular  hypertrophy. Left ventricular diastolic parameters  are consistent with Grade II diastolic dysfunction (pseudonormalization).  Elevated left ventricular end-diastolic pressure. The E/e' is 22.   2. Right ventricular systolic function is mildly reduced. The right  ventricular size is normal. There is normal pulmonary artery systolic  pressure. The estimated right ventricular systolic pressure is 20.1 mmHg.   3. Left atrial size was mildly dilated.   4. The mitral valve is abnormal. Mild mitral valve regurgitation.   5. The aortic valve is tricuspid. Aortic valve regurgitation is not  visualized.   6. The inferior vena cava is normal in size with greater than 50%  respiratory variability, suggesting right atrial pressure of 3 mmHg.   7. Rhythm strip during this exam demonstrates sinus bradycardia.      Neuro/Psych  PSYCHIATRIC DISORDERS  Depression    negative neurological ROS     GI/Hepatic negative GI ROS, Neg liver ROS,,,  Endo/Other  Hypothyroidism    Renal/GU Renal InsufficiencyRenal disease  negative genitourinary   Musculoskeletal negative musculoskeletal ROS (+)    Abdominal   Peds negative pediatric ROS (+)   Hematology negative hematology ROS (+)   Anesthesia Other Findings   Reproductive/Obstetrics negative OB ROS                             Anesthesia Physical Anesthesia Plan  ASA: 3  Anesthesia Plan: MAC   Post-op Pain Management: Minimal or no pain anticipated   Induction: Intravenous  PONV Risk Score and Plan: 1 and Ondansetron  Airway Management Planned: Nasal Cannula, Natural Airway and Simple Face Mask  Additional Equipment:   Intra-op Plan:   Post-operative Plan:   Informed Consent: I have reviewed the patients History and Physical, chart, labs and discussed the procedure including the risks, benefits and alternatives for the proposed anesthesia with the patient or authorized representative who has indicated his/her understanding and acceptance.     Dental advisory given  Plan Discussed with: CRNA and Surgeon  Anesthesia Plan Comments: (Possible GA with airway was discussed )        Anesthesia Quick Evaluation

## 2022-11-10 NOTE — Anesthesia Postprocedure Evaluation (Signed)
Anesthesia Post Note  Patient: Catherine West  Procedure(s) Performed: LESION REMOVAL TRUNK (Left: Abdomen)  Patient location during evaluation: Phase II Anesthesia Type: MAC Level of consciousness: awake and alert and oriented Pain management: pain level controlled Vital Signs Assessment: post-procedure vital signs reviewed and stable Respiratory status: spontaneous breathing, nonlabored ventilation and respiratory function stable Cardiovascular status: blood pressure returned to baseline and stable Postop Assessment: no apparent nausea or vomiting Anesthetic complications: no  No notable events documented.   Last Vitals:  Vitals:   11/10/22 1153 11/10/22 1159  BP:  (!) 182/66  Pulse: (!) 55 (!) 54  Resp: 18 16  Temp:  36.5 C  SpO2: (!) 89% 95%    Last Pain:  Vitals:   11/10/22 1159  TempSrc:   PainSc: 0-No pain                 Cletus Paris C Leslyn Monda

## 2022-11-10 NOTE — Transfer of Care (Signed)
Immediate Anesthesia Transfer of Care Note  Patient: Catherine West  Procedure(s) Performed: LESION REMOVAL TRUNK (Left: Abdomen)  Patient Location: PACU  Anesthesia Type:MAC  Level of Consciousness: awake  Airway & Oxygen Therapy: Patient Spontanous Breathing  Post-op Assessment: Report given to RN  Post vital signs: Reviewed and stable  Last Vitals:  Vitals Value Taken Time  BP 182/66 11/10/22 1159  Temp 36.5 C 11/10/22 1159  Pulse 54 11/10/22 1159  Resp 16 11/10/22 1159  SpO2 95 % 11/10/22 1159    Last Pain:  Vitals:   11/10/22 1159  TempSrc:   PainSc: 0-No pain      Patients Stated Pain Goal: 5 (11/10/22 0817)  Complications: No notable events documented.

## 2022-11-12 ENCOUNTER — Encounter (HOSPITAL_COMMUNITY): Payer: Self-pay | Admitting: General Surgery

## 2022-11-12 LAB — SURGICAL PATHOLOGY

## 2022-11-22 DIAGNOSIS — G894 Chronic pain syndrome: Secondary | ICD-10-CM | POA: Diagnosis not present

## 2022-11-22 DIAGNOSIS — F1120 Opioid dependence, uncomplicated: Secondary | ICD-10-CM | POA: Diagnosis not present

## 2022-11-23 ENCOUNTER — Ambulatory Visit (INDEPENDENT_AMBULATORY_CARE_PROVIDER_SITE_OTHER): Payer: Medicare Other | Admitting: General Surgery

## 2022-11-23 ENCOUNTER — Encounter: Payer: Self-pay | Admitting: General Surgery

## 2022-11-23 DIAGNOSIS — C44519 Basal cell carcinoma of skin of other part of trunk: Secondary | ICD-10-CM

## 2022-11-23 DIAGNOSIS — M79675 Pain in left toe(s): Secondary | ICD-10-CM | POA: Diagnosis not present

## 2022-11-23 DIAGNOSIS — Z09 Encounter for follow-up examination after completed treatment for conditions other than malignant neoplasm: Secondary | ICD-10-CM

## 2022-11-23 DIAGNOSIS — M79674 Pain in right toe(s): Secondary | ICD-10-CM | POA: Diagnosis not present

## 2022-11-23 DIAGNOSIS — B351 Tinea unguium: Secondary | ICD-10-CM | POA: Diagnosis not present

## 2022-11-23 NOTE — Progress Notes (Signed)
Patient resides in a nursing home and I talked to the nurse taking care of the patient.  She states the incision is healing well.  I told her that final pathology showed complete excision of the basal cell carcinoma with clear margins.  She will relay that to the patient.  I told them to call me should any problems arise.

## 2022-11-29 DIAGNOSIS — K219 Gastro-esophageal reflux disease without esophagitis: Secondary | ICD-10-CM | POA: Diagnosis not present

## 2022-11-29 DIAGNOSIS — I130 Hypertensive heart and chronic kidney disease with heart failure and stage 1 through stage 4 chronic kidney disease, or unspecified chronic kidney disease: Secondary | ICD-10-CM | POA: Diagnosis not present

## 2022-11-29 DIAGNOSIS — I10 Essential (primary) hypertension: Secondary | ICD-10-CM | POA: Diagnosis not present

## 2022-11-29 DIAGNOSIS — E782 Mixed hyperlipidemia: Secondary | ICD-10-CM | POA: Diagnosis not present

## 2022-12-02 ENCOUNTER — Inpatient Hospital Stay (HOSPITAL_COMMUNITY)
Admission: EM | Admit: 2022-12-02 | Discharge: 2022-12-07 | DRG: 535 | Disposition: A | Discharge status: Skilled Nursing Facility | Payer: Medicare Other | Source: Skilled Nursing Facility | Attending: Internal Medicine | Admitting: Internal Medicine

## 2022-12-02 ENCOUNTER — Encounter (HOSPITAL_COMMUNITY): Payer: Self-pay

## 2022-12-02 ENCOUNTER — Emergency Department (HOSPITAL_COMMUNITY): Payer: Medicare Other

## 2022-12-02 DIAGNOSIS — I5022 Chronic systolic (congestive) heart failure: Secondary | ICD-10-CM | POA: Diagnosis not present

## 2022-12-02 DIAGNOSIS — M810 Age-related osteoporosis without current pathological fracture: Secondary | ICD-10-CM | POA: Diagnosis not present

## 2022-12-02 DIAGNOSIS — M25552 Pain in left hip: Secondary | ICD-10-CM | POA: Diagnosis not present

## 2022-12-02 DIAGNOSIS — Z825 Family history of asthma and other chronic lower respiratory diseases: Secondary | ICD-10-CM | POA: Diagnosis not present

## 2022-12-02 DIAGNOSIS — J189 Pneumonia, unspecified organism: Secondary | ICD-10-CM | POA: Diagnosis not present

## 2022-12-02 DIAGNOSIS — R918 Other nonspecific abnormal finding of lung field: Secondary | ICD-10-CM | POA: Diagnosis not present

## 2022-12-02 DIAGNOSIS — Z79899 Other long term (current) drug therapy: Secondary | ICD-10-CM | POA: Diagnosis not present

## 2022-12-02 DIAGNOSIS — R262 Difficulty in walking, not elsewhere classified: Secondary | ICD-10-CM | POA: Diagnosis not present

## 2022-12-02 DIAGNOSIS — S32592A Other specified fracture of left pubis, initial encounter for closed fracture: Secondary | ICD-10-CM | POA: Diagnosis not present

## 2022-12-02 DIAGNOSIS — S0990XA Unspecified injury of head, initial encounter: Secondary | ICD-10-CM | POA: Diagnosis not present

## 2022-12-02 DIAGNOSIS — I130 Hypertensive heart and chronic kidney disease with heart failure and stage 1 through stage 4 chronic kidney disease, or unspecified chronic kidney disease: Secondary | ICD-10-CM | POA: Diagnosis present

## 2022-12-02 DIAGNOSIS — D72829 Elevated white blood cell count, unspecified: Secondary | ICD-10-CM | POA: Insufficient documentation

## 2022-12-02 DIAGNOSIS — E039 Hypothyroidism, unspecified: Secondary | ICD-10-CM | POA: Diagnosis present

## 2022-12-02 DIAGNOSIS — S199XXA Unspecified injury of neck, initial encounter: Secondary | ICD-10-CM | POA: Diagnosis not present

## 2022-12-02 DIAGNOSIS — W19XXXA Unspecified fall, initial encounter: Secondary | ICD-10-CM | POA: Diagnosis present

## 2022-12-02 DIAGNOSIS — F339 Major depressive disorder, recurrent, unspecified: Secondary | ICD-10-CM | POA: Diagnosis not present

## 2022-12-02 DIAGNOSIS — F329 Major depressive disorder, single episode, unspecified: Secondary | ICD-10-CM | POA: Diagnosis not present

## 2022-12-02 DIAGNOSIS — M6281 Muscle weakness (generalized): Secondary | ICD-10-CM | POA: Diagnosis not present

## 2022-12-02 DIAGNOSIS — S329XXA Fracture of unspecified parts of lumbosacral spine and pelvis, initial encounter for closed fracture: Secondary | ICD-10-CM | POA: Diagnosis not present

## 2022-12-02 DIAGNOSIS — M47816 Spondylosis without myelopathy or radiculopathy, lumbar region: Secondary | ICD-10-CM | POA: Diagnosis not present

## 2022-12-02 DIAGNOSIS — J9601 Acute respiratory failure with hypoxia: Secondary | ICD-10-CM | POA: Diagnosis present

## 2022-12-02 DIAGNOSIS — I10 Essential (primary) hypertension: Secondary | ICD-10-CM | POA: Diagnosis not present

## 2022-12-02 DIAGNOSIS — N1832 Chronic kidney disease, stage 3b: Secondary | ICD-10-CM | POA: Diagnosis present

## 2022-12-02 DIAGNOSIS — D539 Nutritional anemia, unspecified: Secondary | ICD-10-CM | POA: Diagnosis not present

## 2022-12-02 DIAGNOSIS — S32592D Other specified fracture of left pubis, subsequent encounter for fracture with routine healing: Secondary | ICD-10-CM | POA: Diagnosis not present

## 2022-12-02 DIAGNOSIS — R053 Chronic cough: Secondary | ICD-10-CM | POA: Diagnosis not present

## 2022-12-02 DIAGNOSIS — E538 Deficiency of other specified B group vitamins: Secondary | ICD-10-CM | POA: Diagnosis not present

## 2022-12-02 DIAGNOSIS — S32512D Fracture of superior rim of left pubis, subsequent encounter for fracture with routine healing: Secondary | ICD-10-CM | POA: Diagnosis not present

## 2022-12-02 DIAGNOSIS — E785 Hyperlipidemia, unspecified: Secondary | ICD-10-CM | POA: Diagnosis present

## 2022-12-02 DIAGNOSIS — Z66 Do not resuscitate: Secondary | ICD-10-CM | POA: Diagnosis present

## 2022-12-02 DIAGNOSIS — W010XXA Fall on same level from slipping, tripping and stumbling without subsequent striking against object, initial encounter: Secondary | ICD-10-CM | POA: Diagnosis not present

## 2022-12-02 DIAGNOSIS — S32010A Wedge compression fracture of first lumbar vertebra, initial encounter for closed fracture: Secondary | ICD-10-CM | POA: Diagnosis not present

## 2022-12-02 DIAGNOSIS — S32512A Fracture of superior rim of left pubis, initial encounter for closed fracture: Secondary | ICD-10-CM | POA: Diagnosis not present

## 2022-12-02 DIAGNOSIS — M4856XA Collapsed vertebra, not elsewhere classified, lumbar region, initial encounter for fracture: Secondary | ICD-10-CM | POA: Diagnosis not present

## 2022-12-02 DIAGNOSIS — I672 Cerebral atherosclerosis: Secondary | ICD-10-CM | POA: Diagnosis not present

## 2022-12-02 DIAGNOSIS — S3992XA Unspecified injury of lower back, initial encounter: Secondary | ICD-10-CM | POA: Diagnosis not present

## 2022-12-02 DIAGNOSIS — M549 Dorsalgia, unspecified: Secondary | ICD-10-CM | POA: Diagnosis not present

## 2022-12-02 DIAGNOSIS — Z741 Need for assistance with personal care: Secondary | ICD-10-CM | POA: Diagnosis not present

## 2022-12-02 DIAGNOSIS — Z9181 History of falling: Secondary | ICD-10-CM | POA: Diagnosis not present

## 2022-12-02 DIAGNOSIS — D649 Anemia, unspecified: Secondary | ICD-10-CM | POA: Diagnosis not present

## 2022-12-02 DIAGNOSIS — R488 Other symbolic dysfunctions: Secondary | ICD-10-CM | POA: Diagnosis not present

## 2022-12-02 DIAGNOSIS — C44519 Basal cell carcinoma of skin of other part of trunk: Secondary | ICD-10-CM | POA: Diagnosis not present

## 2022-12-02 DIAGNOSIS — I700 Atherosclerosis of aorta: Secondary | ICD-10-CM | POA: Diagnosis not present

## 2022-12-02 DIAGNOSIS — M545 Low back pain, unspecified: Secondary | ICD-10-CM | POA: Diagnosis not present

## 2022-12-02 DIAGNOSIS — I5032 Chronic diastolic (congestive) heart failure: Secondary | ICD-10-CM | POA: Diagnosis not present

## 2022-12-02 DIAGNOSIS — R9089 Other abnormal findings on diagnostic imaging of central nervous system: Secondary | ICD-10-CM | POA: Diagnosis not present

## 2022-12-02 DIAGNOSIS — I517 Cardiomegaly: Secondary | ICD-10-CM | POA: Diagnosis not present

## 2022-12-02 DIAGNOSIS — M799 Soft tissue disorder, unspecified: Secondary | ICD-10-CM | POA: Diagnosis not present

## 2022-12-02 DIAGNOSIS — S32010S Wedge compression fracture of first lumbar vertebra, sequela: Secondary | ICD-10-CM | POA: Diagnosis not present

## 2022-12-02 NOTE — ED Provider Notes (Signed)
[place] EMERGENCY DEPARTMENT AT [facility] Provider Note   CSN: [id number] Arrival date & time: 12/02/22  2328     History  Chief Complaint  Patient presents with   Catherine West    LEAFY MOTSINGER is a 89 y.o. female.  Patient presents to the emergency department from skilled nursing facility after a fall.  She was walking back from the bathroom, not using her walker when she lost her balance.  She reportedly fell backwards, into a seated position and then hit her head on the ground.  No loss of consciousness.  She is complaining of low back pain and left hip pain.       Home Medications Prior to Admission medications   Medication Sig Start Date End Date Taking? Authorizing Provider  amLODipine (NORVASC) 5 MG tablet Take 1 tablet (5 mg total) by mouth daily. 09/08/22   Sherryll Burger, Pratik D, DO  Cholecalciferol (VITAMIN D) 50 MCG (2000 UT) CAPS Take 2,000 Units by mouth daily.    [provider]  diclofenac Sodium (VOLTAREN) 1 % GEL Apply 2 g topically in the morning, at noon, and at bedtime.    [provider]  furosemide (LASIX) 20 MG tablet Take 20 mg by mouth daily with breakfast.    [provider]  HYDROcodone-acetaminophen (NORCO/VICODIN) 5-325 MG tablet Take 1 tablet by mouth 3 (three) times daily as needed (back pain). Hold for sedation    [provider]  loperamide (IMODIUM A-D) 2 MG tablet Take 2 mg by mouth every 6 (six) hours as needed for diarrhea or loose stools.    [provider]  losartan (COZAAR) 100 MG tablet Take 1 tablet by mouth daily. 01/15/19   [provider]  sertraline (ZOLOFT) 100 MG tablet Take 100 mg by mouth daily.    [provider]      Allergies    Patient has no known allergies.    Review of Systems   Review of Systems  Physical Exam Updated Vital Signs BP (!) 180/63   Pulse 61   Temp 98.1 F (36.7 C) (Oral)   Resp 14   Ht 5\' 2"  (1.575 m)   Wt 57.2 kg   SpO2 96%   BMI  23.05 kg/m  Physical Exam Vitals and nursing note reviewed.  Constitutional:      General: She is not in acute distress.    Appearance: She is well-developed.  HENT:     Head: Normocephalic and atraumatic.     Mouth/Throat:     Mouth: Mucous membranes are moist.  Eyes:     General: Vision grossly intact. Gaze aligned appropriately.     Extraocular Movements: Extraocular movements intact.     Conjunctiva/sclera: Conjunctivae normal.  Cardiovascular:     Rate and Rhythm: Normal rate and regular rhythm.     Pulses: Normal pulses.     Heart sounds: Normal heart sounds, S1 normal and S2 normal. No murmur heard.    No friction rub. No gallop.  Pulmonary:     Effort: Pulmonary effort is normal. No respiratory distress.     Breath sounds: Normal breath sounds.  Abdominal:     General: Bowel sounds are normal.     Palpations: Abdomen is soft.     Tenderness: There is no abdominal tenderness. There is no guarding or rebound.     Hernia: No hernia is present.  Musculoskeletal:        General: No swelling.     Cervical  back: Full passive range of motion without pain, normal range of motion and neck supple. No spinous process tenderness or muscular tenderness. Normal range of motion.     Lumbar back: Tenderness present.     Left hip: No deformity. Decreased range of motion (due to pain).     Right lower leg: No edema.     Left lower leg: No edema.  Skin:    General: Skin is warm and dry.     Capillary Refill: Capillary refill takes less than 2 seconds.     Findings: No ecchymosis, erythema, rash or wound.  Neurological:     General: No focal deficit present.     Mental Status: She is alert and oriented to person, place, and time.     GCS: GCS eye subscore is 4. GCS verbal subscore is 5. GCS motor subscore is 6.     Cranial Nerves: Cranial nerves 2-12 are intact.     Sensory: Sensation is intact.     Motor: Motor function is intact.     Coordination: Coordination is intact.   Psychiatric:        Attention and Perception: Attention normal.        Mood and Affect: Mood normal.        Speech: Speech normal.        Behavior: Behavior normal.     ED Results / Procedures / Treatments   Labs (all labs ordered are listed, but only abnormal results are displayed) Labs Reviewed  CBC WITH DIFFERENTIAL/PLATELET - Abnormal; Notable for the following components:      Result Value   WBC 19.8 (*)    RBC 3.86 (*)    MCV 101.3 (*)    Neutro Abs 16.5 (*)    Abs Immature Granulocytes 0.37 (*)    All other components within normal limits  BASIC METABOLIC PANEL - Abnormal; Notable for the following components:   Glucose, Bld 156 (*)    BUN 25 (*)    Creatinine, Ser 1.37 (*)    Calcium 8.6 (*)    GFR, Estimated 34 (*)    All other components within normal limits    EKG None  Radiology CT Lumbar Spine Wo Contrast  Result Date: 12/03/2022 CLINICAL DATA:  Fall and trauma to the back. EXAM: CT LUMBAR SPINE WITHOUT CONTRAST TECHNIQUE: Multidetector CT imaging of the lumbar spine was performed without intravenous contrast administration. Multiplanar CT image reconstructions were also generated. RADIATION DOSE REDUCTION: This exam was performed according to the departmental dose-optimization program which includes automated exposure control, adjustment of the mA and/or kV according to patient size and/or use of iterative reconstruction technique. COMPARISON:  CT abdomen pelvis dated 09/04/2022. FINDINGS: Segmentation: 5 lumbar type vertebrae. Alignment: No acute subluxation. Vertebrae: There is compression fracture of inferior endplate of L1, significantly progressed since the prior CT. There is near complete loss of vertebral body height centrally. This is concerning for an acute or subacute on chronic fracture. Correlation with clinical exam and point tenderness recommended. There is approximately 3 mm retropulsion of the posteroinferior endplate. Additionally there is linear  lucency along the inferior edge of the left sacral ala (135/6 and 56/9) suspicious for an acute fracture. There is advanced osteopenia. Paraspinal and other soft tissues: No paraspinal fluid collection or hematoma. There is advanced atherosclerotic calcification of the visualized aorta. Disc levels: There is degenerative changes. Disc desiccation and vacuum phenomena at T12-L1 and L1-L2. IMPRESSION: 1. Progression of compression fracture of the inferior endplate of  L1 since the prior CT of 09/04/2022. This likely represents an acute or subacute fracture. Correlation with point tenderness recommended. A 3 mm retropulsion of the posteroinferior endplate. 2. Suspected acute fracture of the left sacral ala. 3. Advanced osteopenia. 4.  Aortic Atherosclerosis (ICD10-I70.0). Electronically Signed   By: Elgie Collard M.D.   On: 12/03/2022 01:41   CT Hip Left Wo Contrast  Result Date: 12/03/2022 CLINICAL DATA:  Hip trauma fall EXAM: CT OF THE LEFT HIP WITHOUT CONTRAST TECHNIQUE: Multidetector CT imaging of the left hip was performed according to the standard protocol. Multiplanar CT image reconstructions were also generated. RADIATION DOSE REDUCTION: This exam was performed according to the departmental dose-optimization program which includes automated exposure control, adjustment of the mA and/or kV according to patient size and/or use of iterative reconstruction technique. COMPARISON:  Radiograph 12/03/2022 FINDINGS: Bones/Joint/Cartilage Acute comminuted left superior and inferior pubic rami fractures with extension of lucency to the pubic symphysis but no symphyseal widening. No acetabular fracture. Proximal femur is intact. No sizable hip effusion Ligaments Suboptimally assessed by CT. Muscles and Tendons No intramuscular collections.  Mild atrophy. Soft tissues Skin thickening and subcutaneous edema at the left posterolateral hip most likely a contusion. No significant pelvic hematoma. IMPRESSION: Acute  comminuted left superior and inferior pubic rami fractures with extension to the pubic symphysis but no symphyseal widening. No other discrete fractures are visualized. Electronically Signed   By: Jasmine Pang M.D.   On: 12/03/2022 01:34   CT HEAD WO CONTRAST ( )  Result Date: 12/03/2022 CLINICAL DATA:  Head trauma, minor (Age >= 65y); Neck trauma (Age >= 65y) EXAM: CT HEAD WITHOUT CONTRAST CT CERVICAL SPINE WITHOUT CONTRAST TECHNIQUE: Multidetector CT imaging of the head and cervical spine was performed following the standard protocol without intravenous contrast. Multiplanar CT image reconstructions of the cervical spine were also generated. RADIATION DOSE REDUCTION: This exam was performed according to the departmental dose-optimization program which includes automated exposure control, adjustment of the mA and/or kV according to patient size and/or use of iterative reconstruction technique. COMPARISON:  CT head 11/04/2019, CT head 02/22/2018 FINDINGS: CT HEAD FINDINGS Brain: Cerebral ventricle sizes are concordant with the degree of cerebral volume loss. Patchy and confluent areas of decreased attenuation are noted throughout the deep and periventricular white matter of the cerebral hemispheres bilaterally, compatible with chronic microvascular ischemic disease. No evidence of large-territorial acute infarction. No parenchymal hemorrhage. No mass lesion. No extra-axial collection. No mass effect or midline shift. No hydrocephalus. Basilar cisterns are patent. Vascular: No hyperdense vessel. Atherosclerotic calcifications are present within the cavernous internal carotid and vertebral arteries. Skull: No acute fracture or focal lesion. Sinuses/Orbits: Paranasal sinuses and mastoid air cells are clear. The orbits are unremarkable. Other: None. CT CERVICAL SPINE FINDINGS Alignment: Normal. Skull base and vertebrae: Multilevel moderate degenerative changes spine with associated severe osseous neural foraminal  stenosis at the left C3-C4 level. No acute fracture. No aggressive appearing focal osseous lesion or focal pathologic process. Soft tissues and spinal canal: No prevertebral fluid or swelling. No visible canal hematoma. Upper chest: Unremarkable. Other: Atherosclerotic plaque of the carotid arteries within the neck. IMPRESSION: 1. No acute intracranial abnormality. 2. No acute displaced fracture or traumatic listhesis of the cervical spine. 3. Multilevel moderate degenerative changes spine with associated severe osseous neural foraminal stenosis at the left C3-C4 level. Electronically Signed   By: Tish Frederickson M.D.   On: 12/03/2022 01:17   CT CERVICAL SPINE WO CONTRAST  Result Date: 12/03/2022 CLINICAL DATA:  Head trauma, minor (Age >= 65y); Neck trauma (Age >= 65y) EXAM: CT HEAD WITHOUT CONTRAST CT CERVICAL SPINE WITHOUT CONTRAST TECHNIQUE: Multidetector CT imaging of the head and cervical spine was performed following the standard protocol without intravenous contrast. Multiplanar CT image reconstructions of the cervical spine were also generated. RADIATION DOSE REDUCTION: This exam was performed according to the departmental dose-optimization program which includes automated exposure control, adjustment of the mA and/or kV according to patient size and/or use of iterative reconstruction technique. COMPARISON:  CT head 11/04/2019, CT head 02/22/2018 FINDINGS: CT HEAD FINDINGS Brain: Cerebral ventricle sizes are concordant with the degree of cerebral volume loss. Patchy and confluent areas of decreased attenuation are noted throughout the deep and periventricular white matter of the cerebral hemispheres bilaterally, compatible with chronic microvascular ischemic disease. No evidence of large-territorial acute infarction. No parenchymal hemorrhage. No mass lesion. No extra-axial collection. No mass effect or midline shift. No hydrocephalus. Basilar cisterns are patent. Vascular: No hyperdense vessel.  Atherosclerotic calcifications are present within the cavernous internal carotid and vertebral arteries. Skull: No acute fracture or focal lesion. Sinuses/Orbits: Paranasal sinuses and mastoid air cells are clear. The orbits are unremarkable. Other: None. CT CERVICAL SPINE FINDINGS Alignment: Normal. Skull base and vertebrae: Multilevel moderate degenerative changes spine with associated severe osseous neural foraminal stenosis at the left C3-C4 level. No acute fracture. No aggressive appearing focal osseous lesion or focal pathologic process. Soft tissues and spinal canal: No prevertebral fluid or swelling. No visible canal hematoma. Upper chest: Unremarkable. Other: Atherosclerotic plaque of the carotid arteries within the neck. IMPRESSION: 1. No acute intracranial abnormality. 2. No acute displaced fracture or traumatic listhesis of the cervical spine. 3. Multilevel moderate degenerative changes spine with associated severe osseous neural foraminal stenosis at the left C3-C4 level. Electronically Signed   By: Tish Frederickson M.D.   On: 12/03/2022 01:17   DG Hip Unilat W or Wo Pelvis 2-3 Views Left  Result Date: 12/03/2022 CLINICAL DATA:  Fall with left-sided hip pain EXAM: DG HIP (WITH OR WITHOUT PELVIS) 2-3V LEFT COMPARISON:  CT 09/04/2022 FINDINGS: SI joints are non widened. Pubic symphysis appears intact. Acute left superior and inferior pubic rami fractures near the pubic symphysis. Question medial left acetabular fracture. No femoral head dislocation. Vascular calcifications IMPRESSION: Acute left superior and inferior pubic rami fractures approaching the symphysis. Question medial left acetabular fracture. Recommend CT for further assessment Electronically Signed   By: Jasmine Pang M.D.   On: 12/03/2022 00:31   DG Chest 1 View  Result Date: 12/03/2022 CLINICAL DATA:  Fall with left-sided hip pain EXAM: CHEST  1 VIEW COMPARISON:  09/04/2022, CT 10/14/2013, 02/22/2018 FINDINGS: Mild cardiomegaly with  aortic atherosclerosis. Possible patchy infiltrate in the right lower lung. Trace right pleural effusion or thickening. No pneumothorax IMPRESSION: Possible patchy infiltrate in the right lower lung. Trace right pleural effusion or thickening. Cardiomegaly Electronically Signed   By: Jasmine Pang M.D.   On: 12/03/2022 00:29    Procedures Procedures    Medications Ordered in ED Medications  morphine (PF) 2 MG/ML injection 2 mg (2 mg Intravenous Given 12/03/22 0357)  ondansetron (ZOFRAN) injection 4 mg (4 mg Intravenous Given 12/03/22 0357)    ED Course/ Medical Decision Making/ A&P                                 Medical Decision Making Amount and/or Complexity of Data Reviewed Labs: ordered. Radiology:  ordered.  Risk Prescription drug management. Decision regarding hospitalization.   Differential diagnosis considered includes, but not limited to: Head injury; spinal injury; extremity/orthopedic injury  Patient presents to the emergency department for evaluation after a ground-level fall.  She was walking and lost her balance, fell backwards.  Staff reports that she fell into a seated position and then hit her head on the ground.  No loss of consciousness.  She does not take blood thinners.  Arrival to the ED, patient complains of pain in her lower back and left hip.  Examination reveals no deformity of the hip.  She does have pain with range of motion, however.  X-ray with inferior and superior pubic rami fractures, possible acetabular injury.  CT scan was performed, however, and does not show acetabular injury.  Findings discussed with Dr. Linna Caprice, on-call for orthopedics.  Patient does not need to be transferred to [facility], does not need any specific orthopedic treatment.  Pain management, weightbearing as tolerated.  CT lumbar spine shows L1 fracture.  She has a pre-existing L1 compression fracture but this has compressed further and she does have a small retropulsed element.   She is neurologically intact.  These findings were discussed with Dr. Conchita Paris, on-call for neurosurgery.  No intervention necessary, patient does not require transfer to [facility].  Based on these consult, patient has L1 compression fracture and pelvic fracture that does not require transfer.  She can be admitted to [facility] for pain management.        Final Clinical Impression(s) / ED Diagnoses Final diagnoses:  Closed displaced fracture of pelvis, unspecified part of pelvis, initial encounter (HCC)  Closed compression fracture of body of L1 vertebra [facility])    Rx / DC Orders ED Discharge Orders     None         Gilda Crease, MD 12/03/22 [phone]

## 2022-12-02 NOTE — ED Triage Notes (Signed)
Pt arrived from [facility] BIB [facility] for a unwitnessed fall, pt reports w/o her walker back to bed from BR and fell on her bottom falling backwards hitting her head on floor. No LOC, pt at baseline, A&O x4 per EMS. Pt c/o left hip and lower back pain, pt refused c-collar PTA per ems. VSS Pt not on thinners.

## 2022-12-02 NOTE — ED Notes (Signed)
Patient transported to X-ray 

## 2022-12-03 ENCOUNTER — Emergency Department (HOSPITAL_COMMUNITY): Payer: Medicare Other

## 2022-12-03 DIAGNOSIS — Z66 Do not resuscitate: Secondary | ICD-10-CM | POA: Diagnosis present

## 2022-12-03 DIAGNOSIS — J9601 Acute respiratory failure with hypoxia: Secondary | ICD-10-CM | POA: Diagnosis present

## 2022-12-03 DIAGNOSIS — S32010A Wedge compression fracture of first lumbar vertebra, initial encounter for closed fracture: Secondary | ICD-10-CM

## 2022-12-03 DIAGNOSIS — N1832 Chronic kidney disease, stage 3b: Secondary | ICD-10-CM | POA: Diagnosis not present

## 2022-12-03 DIAGNOSIS — E785 Hyperlipidemia, unspecified: Secondary | ICD-10-CM | POA: Diagnosis not present

## 2022-12-03 DIAGNOSIS — M810 Age-related osteoporosis without current pathological fracture: Secondary | ICD-10-CM | POA: Diagnosis not present

## 2022-12-03 DIAGNOSIS — R488 Other symbolic dysfunctions: Secondary | ICD-10-CM | POA: Diagnosis not present

## 2022-12-03 DIAGNOSIS — S329XXA Fracture of unspecified parts of lumbosacral spine and pelvis, initial encounter for closed fracture: Secondary | ICD-10-CM | POA: Diagnosis not present

## 2022-12-03 DIAGNOSIS — Z825 Family history of asthma and other chronic lower respiratory diseases: Secondary | ICD-10-CM | POA: Diagnosis not present

## 2022-12-03 DIAGNOSIS — M6281 Muscle weakness (generalized): Secondary | ICD-10-CM | POA: Diagnosis not present

## 2022-12-03 DIAGNOSIS — E538 Deficiency of other specified B group vitamins: Secondary | ICD-10-CM | POA: Diagnosis present

## 2022-12-03 DIAGNOSIS — I5032 Chronic diastolic (congestive) heart failure: Secondary | ICD-10-CM | POA: Diagnosis not present

## 2022-12-03 DIAGNOSIS — S32592D Other specified fracture of left pubis, subsequent encounter for fracture with routine healing: Secondary | ICD-10-CM | POA: Diagnosis not present

## 2022-12-03 DIAGNOSIS — W19XXXA Unspecified fall, initial encounter: Secondary | ICD-10-CM | POA: Diagnosis not present

## 2022-12-03 DIAGNOSIS — I5022 Chronic systolic (congestive) heart failure: Secondary | ICD-10-CM | POA: Diagnosis present

## 2022-12-03 DIAGNOSIS — Z79899 Other long term (current) drug therapy: Secondary | ICD-10-CM | POA: Diagnosis not present

## 2022-12-03 DIAGNOSIS — M545 Low back pain, unspecified: Secondary | ICD-10-CM | POA: Diagnosis present

## 2022-12-03 DIAGNOSIS — E039 Hypothyroidism, unspecified: Secondary | ICD-10-CM | POA: Diagnosis not present

## 2022-12-03 DIAGNOSIS — I10 Essential (primary) hypertension: Secondary | ICD-10-CM | POA: Diagnosis not present

## 2022-12-03 DIAGNOSIS — D649 Anemia, unspecified: Secondary | ICD-10-CM | POA: Diagnosis not present

## 2022-12-03 DIAGNOSIS — R262 Difficulty in walking, not elsewhere classified: Secondary | ICD-10-CM | POA: Diagnosis not present

## 2022-12-03 DIAGNOSIS — F339 Major depressive disorder, recurrent, unspecified: Secondary | ICD-10-CM | POA: Diagnosis not present

## 2022-12-03 DIAGNOSIS — D72829 Elevated white blood cell count, unspecified: Secondary | ICD-10-CM

## 2022-12-03 DIAGNOSIS — F329 Major depressive disorder, single episode, unspecified: Secondary | ICD-10-CM | POA: Diagnosis present

## 2022-12-03 DIAGNOSIS — D539 Nutritional anemia, unspecified: Secondary | ICD-10-CM | POA: Diagnosis present

## 2022-12-03 DIAGNOSIS — W010XXA Fall on same level from slipping, tripping and stumbling without subsequent striking against object, initial encounter: Secondary | ICD-10-CM | POA: Diagnosis present

## 2022-12-03 DIAGNOSIS — J189 Pneumonia, unspecified organism: Secondary | ICD-10-CM | POA: Diagnosis present

## 2022-12-03 DIAGNOSIS — S32592A Other specified fracture of left pubis, initial encounter for closed fracture: Secondary | ICD-10-CM | POA: Diagnosis present

## 2022-12-03 DIAGNOSIS — S32010S Wedge compression fracture of first lumbar vertebra, sequela: Secondary | ICD-10-CM | POA: Diagnosis not present

## 2022-12-03 DIAGNOSIS — I130 Hypertensive heart and chronic kidney disease with heart failure and stage 1 through stage 4 chronic kidney disease, or unspecified chronic kidney disease: Secondary | ICD-10-CM | POA: Diagnosis not present

## 2022-12-03 DIAGNOSIS — S32512D Fracture of superior rim of left pubis, subsequent encounter for fracture with routine healing: Secondary | ICD-10-CM | POA: Diagnosis not present

## 2022-12-03 LAB — PROCALCITONIN: Procalcitonin: <0.1 ng/mL

## 2022-12-03 LAB — TROPONIN I (HIGH SENSITIVITY): Troponin I (High Sensitivity): 20 ng/L — ABNORMAL HIGH (ref <18)

## 2022-12-03 LAB — TSH: TSH: 3.81 u[IU]/mL (ref 0.350–4.500)

## 2022-12-03 MED ORDER — SERTRALINE HCL 50 MG PO TABS
100.0000 mg | ORAL_TABLET | Freq: Every day | ORAL | Status: DC
  Administered 2022-12-03 – 2022-12-07 (×5): 100 mg via ORAL
  Filled 2022-12-03 (×5): qty 2

## 2022-12-03 MED ORDER — ONDANSETRON HCL 4 MG PO TABS: 4.0000 mg | ORAL_TABLET | Freq: Four times a day (QID) | ORAL | Status: DC | PRN

## 2022-12-03 MED ORDER — ONDANSETRON HCL 4 MG/2ML IJ SOLN
4.0000 mg | Freq: Once | INTRAMUSCULAR | Status: AC
  Administered 2022-12-03: 4 mg via INTRAVENOUS
  Filled 2022-12-03: qty 2

## 2022-12-03 MED ORDER — AMLODIPINE BESYLATE 5 MG PO TABS
5.0000 mg | ORAL_TABLET | Freq: Every day | ORAL | Status: DC
  Administered 2022-12-03 – 2022-12-06 (×4): 5 mg via ORAL
  Filled 2022-12-03 (×4): qty 1

## 2022-12-03 MED ORDER — ACETAMINOPHEN 650 MG RE SUPP: 650.0000 mg | Freq: Four times a day (QID) | RECTAL | Status: DC | PRN

## 2022-12-03 MED ORDER — ONDANSETRON HCL 4 MG/2ML IJ SOLN
4.0000 mg | Freq: Four times a day (QID) | INTRAMUSCULAR | Status: DC | PRN
  Administered 2022-12-06: 4 mg via INTRAVENOUS
  Filled 2022-12-03: qty 2

## 2022-12-03 MED ORDER — MORPHINE SULFATE (PF) 2 MG/ML IV SOLN
2.0000 mg | INTRAVENOUS | Status: DC | PRN
  Administered 2022-12-03 (×3): 2 mg via INTRAVENOUS
  Filled 2022-12-03 (×3): qty 1

## 2022-12-03 MED ORDER — ACETAMINOPHEN 325 MG PO TABS: 650.0000 mg | ORAL_TABLET | Freq: Four times a day (QID) | ORAL | Status: DC | PRN

## 2022-12-03 MED ORDER — OXYCODONE HCL 5 MG PO TABS
5.0000 mg | ORAL_TABLET | ORAL | Status: DC | PRN
  Administered 2022-12-05: 5 mg via ORAL
  Filled 2022-12-03: qty 1

## 2022-12-03 MED ORDER — LOSARTAN POTASSIUM 50 MG PO TABS
100.0000 mg | ORAL_TABLET | Freq: Every day | ORAL | Status: DC
  Administered 2022-12-03 – 2022-12-05 (×3): 100 mg via ORAL
  Filled 2022-12-03 (×3): qty 2

## 2022-12-03 MED ORDER — HEPARIN SODIUM (PORCINE) 5000 UNIT/ML IJ SOLN
5000.0000 [IU] | Freq: Three times a day (TID) | INTRAMUSCULAR | Status: DC
  Administered 2022-12-03 – 2022-12-07 (×12): 5000 [IU] via SUBCUTANEOUS
  Filled 2022-12-03 (×11): qty 1

## 2022-12-03 MED ORDER — MORPHINE SULFATE (PF) 2 MG/ML IV SOLN
2.0000 mg | Freq: Once | INTRAVENOUS | Status: AC
  Administered 2022-12-03: 2 mg via INTRAVENOUS
  Filled 2022-12-03: qty 1

## 2022-12-03 MED ORDER — SODIUM CHLORIDE 0.9 % IV SOLN
2.0000 g | INTRAVENOUS | Status: DC
  Administered 2022-12-03 – 2022-12-06 (×4): 2 g via INTRAVENOUS
  Filled 2022-12-03 (×4): qty 20

## 2022-12-03 NOTE — Assessment & Plan Note (Signed)
Continue Norvasc, losartan. 

## 2022-12-03 NOTE — H&P (Signed)
History and Physical    Patient: Catherine West:[id number] DOB: June 19, 1920 DOA: 12/02/2022 DOS: the patient was seen and examined on 12/03/2022 PCP: Benita Stabile, MD  Patient coming from: SNF  Chief Complaint:  Chief Complaint  Patient presents with   Fall   HPI: Catherine West is a 89 y.o. female with medical history significant of CKD, hypertension, hyperlipidemia, hypothyroidism, osteoporosis, depression, and more presents to the ED with a chief complaint of unwitnessed fall.  Patient presents from facility with an unwitnessed fall falling backwards and hitting her head.  Patient denies loss of consciousness.  She is alert and oriented.  She reports she does have low back pain and hip pain.  Patient is not on blood thinners.  Patient is not very talkative.  She reports that prior to her fall she had no chest pain, palpitations, dyspnea, dizziness.  She reports she was just getting ready for bed when she went around the corner she tripped and fell backwards.  She is not exactly sure what happened.  Later she says maybe she lost her balance.  She reports she did not have loss of consciousness.  She is not on blood thinners.  She reports that her pain is well-controlled at this time.  Patient has no other complaints at this time.  DNR at bedside  Review of Systems: As mentioned in the history of present illness. All other systems reviewed and are negative. Past Medical History:  Diagnosis Date   Acute bronchitis    Acute bronchitis    Chronic kidney disease, stage 2 (mild)    Chronic kidney disease, unspecified    Cough    Essential (primary) hypertension    HTN (hypertension)    Hyperlipidemia, unspecified    Hypertensive chronic kidney disease with stage 1 through stage 4 chronic kidney disease, or unspecified chronic kidney disease    Hypothyroidism, unspecified    Major depressive disorder, single episode, unspecified    Osteoporosis    Osteoporosis    Pneumonia     Pneumonia    Urinary tract infection    Urinary tract infection    Past Surgical History:  Procedure Laterality Date   LESION REMOVAL Left 11/10/2022   Procedure: LESION REMOVAL TRUNK;  Surgeon: [name], MD;  Location: [facility];  Service: General;  Laterality: Left;  left lower trunk   Social History:  reports that she has never smoked. She has never used smokeless tobacco. She reports that she does not drink alcohol and does not use drugs.  No Known Allergies  Family History  Problem Relation Age of Onset   Other Father        brain tumor, inoperable   Emphysema Sister        smoker   Stroke Neg Hx    Parkinson's disease Neg Hx    Dementia Neg Hx     Prior to Admission medications   Medication Sig Start Date End Date Taking? Authorizing Provider  amLODipine (NORVASC) 5 MG tablet Take 1 tablet (5 mg total) by mouth daily. 09/08/22   Sherryll Burger, Pratik D, DO  Cholecalciferol (VITAMIN D) 50 MCG (2000 UT) CAPS Take 2,000 Units by mouth daily.    [provider]  diclofenac Sodium (VOLTAREN) 1 % GEL Apply 2 g topically in the morning, at noon, and at bedtime.    [provider]  furosemide (LASIX) 20 MG tablet Take 20 mg by mouth daily with breakfast.    [provider]  HYDROcodone-acetaminophen (  NORCO/VICODIN) 5-325 MG tablet Take 1 tablet by mouth 3 (three) times daily as needed (back pain). Hold for sedation    [provider]  loperamide (IMODIUM A-D) 2 MG tablet Take 2 mg by mouth every 6 (six) hours as needed for diarrhea or loose stools.    [provider]  losartan (COZAAR) 100 MG tablet Take 1 tablet by mouth daily. 01/15/19   [provider]  sertraline (ZOLOFT) 100 MG tablet Take 100 mg by mouth daily.    [provider]    Physical Exam: Vitals:   12/03/22 0403 12/03/22 0500 12/03/22 0526 12/03/22 0554  BP: (!) 180/63 (!) 161/57  (!) 191/61  Pulse: 61 61 (!) 59 65  Resp: 14 14 14 20   Temp:   98.1 F (36.7 C)  98.2 F (36.8 C)  TempSrc:   Oral   SpO2: 96% 95% 94% 99%  Weight:      Height:       1.  General: Patient lying supine in bed,  no acute distress   2. Psychiatric: Alert and oriented x 3, mood and behavior normal for situation, pleasant and cooperative with exam   3. Neurologic: Speech and language are normal, face is symmetric, moves all 4 extremities voluntarily, at baseline without acute deficits on limited exam   4. HEENMT:  Head is atraumatic, normocephalic, pupils reactive to light, neck is supple, trachea is midline, mucous membranes are moist   5. Respiratory : Lungs are clear to auscultation bilaterally without wheezing, rhonchi, rales, no cyanosis, no increase in work of breathing or accessory muscle use   6. Cardiovascular : Heart rate normal, rhythm is regular, no murmurs, rubs or gallops, positive for peripheral edema, peripheral pulses palpated   7. Gastrointestinal:  Abdomen is soft, nondistended, nontender to palpation bowel sounds active, no masses or organomegaly palpated   8. Skin:  Skin is warm, dry and intact without rashes, acute lesions, or ulcers on limited exam   9.Musculoskeletal:  No acute deformities or trauma, no asymmetry in tone, positive for peripheral edema, peripheral pulses palpated, no tenderness to palpation in the extremities  Data Reviewed: In the ED patient is afebrile, with vital signs in normal limits - She is on 2 L nasal cannula after receiving pain medication - She does have a leukocytosis at 19.8 but hemoglobin stable at 12.4 - Chemistry is unremarkable - CT C-spine shows no acute findings - CT head has no acute findings - CT left hip shows acute comminuted left superior and inferior pubic rami fractures with extension to the pubic symphysis - CT lumbar spine shows progression of compression fracture of inferior endplate of L1 suggesting acute on chronic.  Also has 3 mm retropulsion of posterior and inferior endplate Chest  x-ray shows possible patchy infiltrates of the right lower lung Patient is a DNR at bedside Admission requested for fall, pelvic fractures, possibility of lung infiltrate  Assessment and Plan: * Fall - Unwitnessed fall at [facility] - Patient reports falling backwards with no loss of consciousness - Imaging shows L1 compression fracture and 3 mm retropulsion of the posterior for your endplate - Neurosurgery was consulted and recommended pain control nothing to do - Patient also has multiple closed pelvic fractures - Ortho consulted and reports pain control nothing to do - Patient is safe to stay here at [facility] - Continue pain control, PT eval and treat  Leukocytosis - 19.8 - Concern for infection - Check UA - Chest x-ray shows possible  patchy infiltrate right lower lung - Patient denies any respiratory symptoms - Cover with Rocephin for respiratory and urinary source - Procalcitonin pending  Compression fracture of L1 lumbar vertebra (HCC) - Secondary to fall - Compression fracture seems to be acute on chronic and imaging - Pain control - Continue to monitor  Major depressive disorder, single episode, unspecified - Continue Zoloft  Essential (primary) hypertension - Continue Norvasc, losartan      Advance Care Planning:   Code Status: DNR  Consults: Neurosurgery consulted and reports pain control, nothing else to do.  Ortho consulted and reports pain control nothing else to do.  Both say that patient can stay here any plan  Family Communication: No family at bedside  Severity of Illness: The appropriate patient status for this patient is INPATIENT. Inpatient status is judged to be reasonable and necessary in order to provide the required intensity of service to ensure the patient's safety. The patient's presenting symptoms, physical exam findings, and initial radiographic and laboratory data in the context of their chronic comorbidities is felt to place them at high risk  for further clinical deterioration. Furthermore, it is not anticipated that the patient will be medically stable for discharge from the hospital within 2 midnights of admission.   * I certify that at the point of admission it is my clinical judgment that the patient will require inpatient hospital care spanning beyond 2 midnights from the point of admission due to high intensity of service, high risk for further deterioration and high frequency of surveillance required.*  Author: Lilyan Gilford, DO 12/03/2022 6:26 AM  For on call review [email or web address].

## 2022-12-03 NOTE — Progress Notes (Signed)
Pt was able to finally fall asleep. This LPN noted rise and fall of chest. Pt ws easy to arose,and able to make needs known. Bed alarm on, fall mats and pts DNR and High fall risk arm bands placed for safety.

## 2022-12-03 NOTE — Progress Notes (Signed)
[facility] PROGRESS NOTE  Catherine West (DOB: 05/13/1920) ONG:[id number] PCP: Benita Stabile, MD  Brief Narrative: Catherine West is a 89 y.o. female who presented to the ED from [facility]F on 12/02/2022 after an unwitnessed fall. She was found to have compression fracture of L1 and left pelvis fracture. Orthopedics recommends WBAT with a walker and follow up as outpatient. She will require a higher level of care, and was admitted to pursue this and confirm adequate pain control.  Subjective: No complaints at rest, finds it very difficult to move without severe pain in her groin and lower back. No headache.   Objective: BP (!) 147/48 (BP Location: Right Arm)   Pulse (!) 58   Temp 99 F (37.2 C) (Oral)   Resp 16   Ht 5\' 2"  (1.575 m)   Wt 57.2 kg   SpO2 91%   BMI 23.05 kg/m   Gen: Pleasant, alert elderly female in no distress Pulm: Clear, nonlabored laterally  CV: RRR, no MRG or pitting edema GI: Soft, NT, ND, +BS  Neuro: Alert and oriented. No new focal deficits. Ext: Warm, no gross deformities. Exam limited by pain with ROM.  Assessment & Plan: RLL opacity on CXR, leukocytosis: Tx w/ceftriaxone at admission. She is afebrile with no pulmonary symptoms. I suspect the leukocytosis is reactive. Procalcitonin is undetectable.   Superior and inferior left pubic rami fractures: Question of left acetabulum fracture not borne out in subsequent CT. - Dr. Linna Caprice consulted > WBAT, 4wk f/u in office. No surgery.   L1 compression fracture:  - Dr. Conchita Paris consulted > no intervention necessary.   Fall: Negative CT head/C spine, not on blood thinner.  - PT/OT > will require SNF placement. Pending.  Tyrone Nine, MD [facility] [email or web address] 12/03/2022, 4:48 PM

## 2022-12-03 NOTE — Assessment & Plan Note (Signed)
-   19.8 - Concern for infection - Check UA - Chest x-ray shows possible patchy infiltrate right lower lung - Patient denies any respiratory symptoms - Cover with Rocephin for respiratory and urinary source - Procalcitonin pending

## 2022-12-03 NOTE — Assessment & Plan Note (Signed)
-   Secondary to fall - Compression fracture seems to be acute on chronic and imaging - Pain control - Continue to monitor

## 2022-12-03 NOTE — TOC Initial Note (Signed)
Transition of Care [facility]) - Initial/Assessment Note    Patient Details  Name: Catherine West MRN: [id number] Date of Birth: 11-28-1920  [facility] CM/SW Contact:    Elliot Gault, LCSW Phone Number: 12/03/2022, 3:02 PM  Clinical Narrative:                  Pt admitted from [facility]F. PT recommending SNF rehab at dc. [facility] team here to see pt and they agree that pt will need rehab before returning to ALF.  This LCSW called pt's dtr/POA, Olegario Messier, to review above. Olegario Messier agreeable to SNF rehab. CMS provider options reviewed. Will refer as requested and start insurance authorization.  Will follow.   Barriers to Discharge: English as a second language teacher, SNF Pending bed offer   Patient Goals and CMS Choice Patient states their goals for this hospitalization and ongoing recovery are:: get better CMS Medicare.gov Compare Post Acute Care list provided to:: Patient Represenative (must comment) Choice offered to / list presented to : [facility] POA / Guardian [place] ownership interest in [facility].provided to:: [facility] POA / Guardian    Expected Discharge Plan and Services In-house Referral: Clinical Social Work   Post Acute Care Choice: Skilled Nursing Facility Living arrangements for the past 2 months: Assisted Living Facility                                      Prior Living Arrangements/Services Living arrangements for the past 2 months: Assisted Living Facility Lives with:: Facility Resident Patient language and need for interpreter reviewed:: Yes Do you feel safe going back to the place where you live?: Yes      Need for Family Participation in Patient Care: No (Comment) Care giver support system in place?: Yes (comment) Current home services: DME Criminal Activity/Legal Involvement Pertinent to Current Situation/Hospitalization: No - Comment as needed  Activities of Daily Living      Permission Sought/Granted Permission sought to share  information with : Oceanographer granted to share information with : Yes, Verbal Permission Granted     Permission granted to share info w AGENCY: sNF        Emotional Assessment Appearance:: Appears stated age     Orientation: : Oriented to Self, Oriented to Place, Oriented to  Time, Oriented to Situation Alcohol / Substance Use: Not Applicable Psych Involvement: No (comment)  Admission diagnosis:  Fall [W19.XXXA] Closed displaced fracture of pelvis, unspecified part of pelvis, initial encounter (HCC) [S32.9XXA] Closed compression fracture of body of L1 vertebra (HCC) [S32.010A] Patient Active Problem List   Diagnosis Date Noted   Fall 12/03/2022   Compression fracture of L1 lumbar vertebra (HCC) 12/03/2022   Leukocytosis 12/03/2022   Basal cell carcinoma (BCC) of abdomen 11/10/2022   Acute CHF (HCC) 09/04/2022   Essential (primary) hypertension    Hyperlipidemia, unspecified    Major depressive disorder, single episode, unspecified    Chronic kidney disease, stage 2 (mild)    Chronic kidney disease, unspecified    Cough    Hypertensive chronic kidney disease with stage 1 through stage 4 chronic kidney disease, or unspecified chronic kidney disease    Hypothyroidism, unspecified    Osteoporosis    Gait abnormality 02/22/2019   Falls frequently 02/22/2019   COLLES' FRACTURE, RIGHT 04/15/2009   PCP:  Benita Stabile, MD Pharmacy:   [facility] - [place], [place] - [address]  [address] [place] [address] Phone: [phone] Fax: [phone]     Social Determinants of Health (SDOH) Social History: SDOH Screenings   Food Insecurity: No Food Insecurity (09/05/2022)  Housing: Low Risk  (09/05/2022)  Transportation Needs: No Transportation Needs (09/05/2022)  Utilities: Not At Risk (09/05/2022)  Tobacco Use: Low Risk  (12/02/2022)   SDOH Interventions:     Readmission Risk Interventions     No data to display

## 2022-12-03 NOTE — NC FL2 (Signed)
[place] MEDICAID FL2 LEVEL OF CARE FORM     IDENTIFICATION  Patient Name: Catherine West Birthdate: 1920/06/06 Sex: female Admission Date (Current Location): 12/02/2022  [facility] [place] Number:  [name] and Address:  [facility],  [address], [place] [address]      Provider Number: [id number]  Attending Physician Name and Address:  Tyrone Nine, MD  Relative Name and Phone Number:       Current Level of Care: Hospital Recommended Level of Care:   Prior Approval Number:    Date Approved/Denied:   PASRR Number: [id number]  Discharge Plan: SNF    Current Diagnoses: Patient Active Problem List   Diagnosis Date Noted   Fall 12/03/2022   Compression fracture of L1 lumbar vertebra (HCC) 12/03/2022   Leukocytosis 12/03/2022   Basal cell carcinoma (BCC) of abdomen 11/10/2022   Acute CHF (HCC) 09/04/2022   Essential (primary) hypertension    Hyperlipidemia, unspecified    Major depressive disorder, single episode, unspecified    Chronic kidney disease, stage 2 (mild)    Chronic kidney disease, unspecified    Cough    Hypertensive chronic kidney disease with stage 1 through stage 4 chronic kidney disease, or unspecified chronic kidney disease    Hypothyroidism, unspecified    Osteoporosis    Gait abnormality 02/22/2019   Falls frequently 02/22/2019   COLLES' FRACTURE, RIGHT 04/15/2009    Orientation RESPIRATION BLADDER Height & Weight     Self, Time, Situation, Place  Normal Continent Weight: 126 lb (57.2 kg) Height:  5\' 2"  (157.5 cm)  BEHAVIORAL SYMPTOMS/MOOD NEUROLOGICAL BOWEL NUTRITION STATUS      Continent    AMBULATORY STATUS COMMUNICATION OF NEEDS Skin   Extensive Assist Verbally Normal                       Personal Care Assistance Level of Assistance  Bathing, Feeding, Dressing Bathing Assistance: Maximum assistance Feeding assistance: Limited assistance Dressing Assistance: Maximum assistance      Functional Limitations Info  Sight, Hearing, Speech Sight Info: Impaired Hearing Info: Adequate Speech Info: Adequate    SPECIAL CARE FACTORS FREQUENCY  PT (By licensed PT), OT (By licensed OT)     PT Frequency: 5x week OT Frequency: 5x week            Contractures Contractures Info: Not present    Additional Factors Info  Code Status, Allergies Code Status Info: DNR Allergies Info: NKA           Current Medications (12/03/2022):  This is the current hospital active medication list Current Facility-Administered Medications  Medication Dose Route Frequency Provider Last Rate Last Admin   acetaminophen (TYLENOL) tablet 650 mg  650 mg Oral Q6H PRN Zierle-Ghosh, Asia B, DO       Or   acetaminophen (TYLENOL) suppository 650 mg  650 mg Rectal Q6H PRN Zierle-Ghosh, Asia B, DO       amLODipine (NORVASC) tablet 5 mg  5 mg Oral Daily Zierle-Ghosh, Asia B, DO   5 mg at 12/03/22 0918   cefTRIAXone (ROCEPHIN) 2 g in sodium chloride 0.9 % 100 mL IVPB  2 g Intravenous Q24H Zierle-Ghosh, Asia B, DO 200 mL/hr at 12/03/22 0924 2 g at 12/03/22 0924   heparin injection 5,000 Units  5,000 Units Subcutaneous Q8H Zierle-Ghosh, Asia B, DO   5,000 Units at 12/03/22 1337   losartan (COZAAR) tablet 100 mg  100 mg Oral Daily Zierle-Ghosh, [place]  B, DO   100 mg at 12/03/22 2440   morphine (PF) 2 MG/ML injection 2 mg  2 mg Intravenous Q2H PRN Zierle-Ghosh, Asia B, DO   2 mg at 12/03/22 1334   ondansetron (ZOFRAN) tablet 4 mg  4 mg Oral Q6H PRN Zierle-Ghosh, Asia B, DO       Or   ondansetron (ZOFRAN) injection 4 mg  4 mg Intravenous Q6H PRN Zierle-Ghosh, Asia B, DO       oxyCODONE (Oxy IR/ROXICODONE) immediate release tablet 5 mg  5 mg Oral Q4H PRN Zierle-Ghosh, Asia B, DO       sertraline (ZOLOFT) tablet 100 mg  100 mg Oral Daily Zierle-Ghosh, Asia B, DO   100 mg at 12/03/22 1027     Discharge Medications: Please see discharge summary for a list of discharge medications.  Relevant Imaging  Results:  Relevant Lab Results:   Additional Information SSN: [id number]  Elliot Gault, LCSW

## 2022-12-03 NOTE — Evaluation (Signed)
Physical Therapy Evaluation Patient Details Name: Catherine West MRN: [id number] DOB: 1920/08/03 Today's Date: 12/03/2022  History of Present Illness  Catherine West is a 89 y.o. female with medical history significant of CKD, hypertension, hyperlipidemia, hypothyroidism, osteoporosis, depression, and more presents to the ED with a chief complaint of unwitnessed fall.  Patient presents from facility with an unwitnessed fall falling backwards and hitting her head.  Patient denies loss of consciousness.  She is alert and oriented.  She reports she does have low back pain and hip pain.  Patient is not on blood thinners.  Patient is not very talkative.  She reports that prior to her fall she had no chest pain, palpitations, dyspnea, dizziness.  She reports she was just getting ready for bed when she went around the corner she tripped and fell backwards.  She is not exactly sure what happened.  Later she says maybe she lost her balance.  She reports she did not have loss of consciousness.  She is not on blood thinners.  She reports that her pain is well-controlled at this time.  Patient has no other complaints at this time.   Clinical Impression  Pt admitted with above diagnosis. Patient supine in bed upon therapist arrival and agreeable to participating in PT evaluation. Nursing administered pain medication via IV prior to mobility. Patient requiring max assistance for all bed mobility due to pain complaints. Max assist to boost up in bed with patient able to use UEs and bedrails but unable to effectively push through heels to boost up in bed. Further mobility deferred this date due to patient's high level of pain with movement despite premedication. Pt currently with functional limitations due to the deficits listed below (see PT Problem List). Pt will benefit from acute skilled PT to increase their independence and safety with mobility to allow discharge.           If plan is discharge home, recommend  the following: Two people to help with walking and/or transfers;A lot of help with bathing/dressing/bathroom   Can travel by private vehicle   No    Equipment Recommendations None recommended by PT (to be determined at next venue of care)  Recommendations for Other Services       Functional Status Assessment Patient has had a recent decline in their functional status and demonstrates the ability to make significant improvements in function in a reasonable and predictable amount of time.     Precautions / Restrictions Precautions Precautions: Fall Restrictions Weight Bearing Restrictions: Yes LLE Weight Bearing: Weight bearing as tolerated (per Dr Linna Caprice)      Mobility  Bed Mobility Overal bed mobility: Needs Assistance Bed Mobility: Rolling, Supine to Sit, Sit to Supine Rolling: Max assist   Supine to sit: Total assist, HOB elevated Sit to supine: Total assist, HOB elevated   General bed mobility comments: max assist to pull up in bed; patient able to use UEs and bedrails but unable to effectively push through heels to boost up in bed; mobility significantly limited due to pain despite premedication    Transfers     General transfer comment: not attempted due to pain complaints    Ambulation/Gait       General Gait Details: not attempted due to pain complaints  Stairs            Wheelchair Mobility     Tilt Bed    Modified Rankin (Stroke Patients Only)       Balance  Pertinent Vitals/Pain Pain Assessment Pain Assessment: PAINAD Breathing: normal Negative Vocalization: none Facial Expression: smiling or inexpressive Body Language: relaxed Consolability: no need to console (at rest) PAINAD Score: 0 Pain Intervention(s): Limited activity within patient's tolerance, Monitored during session, Premedicated before session, Repositioned    Home Living Family/patient expects to be discharged to:: Skilled nursing facility                         Prior Function             Hand Dominance        Extremity/Trunk Assessment   Upper Extremity Assessment Upper Extremity Assessment: Generalized weakness    Lower Extremity Assessment Lower Extremity Assessment: Generalized weakness;LLE deficits/detail LLE Deficits / Details: pain in left groin with all mobility LLE: Unable to fully assess due to pain    Cervical / Trunk Assessment Cervical / Trunk Assessment: Normal  Communication   Communication: No difficulties  Cognition Arousal/Alertness: Awake/alert Behavior During Therapy: WFL for tasks assessed/performed Overall Cognitive Status: Within Functional Limits for tasks assessed           General Comments General comments (skin integrity, edema, etc.): not attempted due to pain complaints    Exercises     Assessment/Plan    PT Assessment Patient needs continued PT services  PT Problem List Decreased strength;Decreased activity tolerance;Decreased mobility;Pain       PT Treatment Interventions DME instruction;Balance training;Gait training;Functional mobility training;Patient/family education;Therapeutic activities;Therapeutic exercise    PT Goals (Current goals can be found in the Care Plan section)  Acute Rehab PT Goals Patient Stated Goal: Get better. PT Goal Formulation: With patient Time For Goal Achievement: 12/17/22 Potential to Achieve Goals: Fair    Frequency Min 2X/week        AM-PAC PT "6 Clicks" Mobility  Outcome Measure Help needed turning from your back to your side while in a flat bed without using bedrails?: A Lot Help needed moving from lying on your back to sitting on the side of a flat bed without using bedrails?: Total Help needed moving to and from a bed to a chair (including a wheelchair)?: Total Help needed standing up from a chair using your arms (e.g., wheelchair or bedside chair)?: Total Help needed to walk in hospital room?: Total Help needed  climbing 3-5 steps with a railing? : Total 6 Click Score: 7    End of Session   Activity Tolerance: Patient limited by pain Patient left: in bed;with call bell/phone within reach;with bed alarm set Nurse Communication: Mobility status;Weight bearing status PT Visit Diagnosis: Difficulty in walking, not elsewhere classified (R26.2);Muscle weakness (generalized) (M62.81);History of falling (Z91.81)    Time: 4098-1191 PT Time Calculation (min) (ACUTE ONLY): 28 min   Charges:   PT Evaluation $PT Eval Low Complexity: 1 Low PT Treatments $Therapeutic Activity: 8-22 mins PT General Charges $$ ACUTE PT VISIT: 1 Visit         Katina Dung. Hartnett-Rands, MS, PT [facility] [place] [phone]  Britta Mccreedy  Hartnett-Rands 12/03/2022, 2:04 PM

## 2022-12-03 NOTE — Progress Notes (Signed)
Consult called by EDP for pelvic fracture. X-rays and CT scan reviewed by myself. She has a L LC1 pelvis fracture. She can WBAT with a walker. Call to schedule follow up with me in 4 weeks.

## 2022-12-03 NOTE — Plan of Care (Signed)
  Problem: Acute Rehab PT Goals(only PT should resolve) Goal: Pt will Roll Supine to Side Outcome: Progressing Flowsheets (Taken 12/03/2022 1410) Pt will Roll Supine to Side:  with max assist  with rail Goal: Pt Will Go Supine/Side To Sit Outcome: Progressing Flowsheets (Taken 12/03/2022 1410) Pt will go Supine/Side to Sit:  with maximum assist  with HOB elevated Goal: Pt Will Go Sit To Supine/Side Outcome: Progressing Flowsheets (Taken 12/03/2022 1410) Pt will go Sit to Supine/Side:  with maximum assist  with HOB  elevated Goal: Patient Will Transfer Sit To/From Stand Outcome: Progressing Flowsheets (Taken 12/03/2022 1410) Patient will transfer sit to/from stand:  with maximum assist  from elevated surface    Britta Mccreedy D. Hartnett-Rands, MS, PT [facility] [phone] 12/03/2022

## 2022-12-03 NOTE — Assessment & Plan Note (Addendum)
-   Unwitnessed fall at SNF - Patient reports falling backwards with no loss of consciousness - Imaging shows L1 compression fracture and 3 mm retropulsion of the posterior for your endplate - Neurosurgery was consulted and recommended pain control nothing to do - Patient also has multiple closed pelvic fractures - Ortho consulted and reports pain control nothing to do - Patient is safe to stay here at [facility] - Continue pain control, PT eval and treat

## 2022-12-03 NOTE — Assessment & Plan Note (Signed)
Continue Zoloft 

## 2022-12-03 NOTE — ED Notes (Signed)
Pt return from radiology, NAD noted, pt alert

## 2022-12-04 MED ORDER — ORAL CARE MOUTH RINSE: 15.0000 mL | OROMUCOSAL | Status: DC | PRN

## 2022-12-04 MED ORDER — FUROSEMIDE 20 MG PO TABS
20.0000 mg | ORAL_TABLET | Freq: Once | ORAL | Status: AC
  Administered 2022-12-04: 20 mg via ORAL
  Filled 2022-12-04: qty 1

## 2022-12-04 NOTE — Plan of Care (Signed)
  Problem: Activity: Goal: Risk for activity intolerance will decrease Outcome: Progressing   Problem: Pain Managment: Goal: General experience of comfort will improve Outcome: Progressing   Pain controlled with PRN pain meds; only reports pain when left leg is moved

## 2022-12-04 NOTE — Progress Notes (Signed)
[facility] PROGRESS NOTE  Catherine West (DOB: 05-02-1921) WUJ:[id number] PCP: Benita Stabile, MD  Brief Narrative: Catherine West is a 89 y.o. female who presented to the ED from [facility] on 12/02/2022 after an unwitnessed fall. She was found to have compression fracture of L1 and left pelvis fracture. Orthopedics recommends WBAT with a walker and follow up as outpatient. She will require a higher level of care, and was admitted to pursue this and confirm adequate pain control.  Subjective: Pain in low back with any movement and pain in left leg when using that leg. No other issues.   Objective: BP (!) 154/60 (BP Location: Left Arm)   Pulse 62   Temp 98 F (36.7 C)   Resp 16   Ht 5\' 2"  (1.575 m)   Wt 57.2 kg   SpO2 93%   BMI 23.05 kg/m   Gen: No distress, elderly and pleasant Pulm: Crackles at bases improving with subsequent breaths  CV: RRR, no MRG, +pitting edema GI: Soft, NT, ND, +BS  Neuro: Alert and oriented. No new focal deficits. Ext: Warm, no deformities. Pain w/LLE ROM. Skin: No new rashes, lesions or ulcers on visualized skin   Assessment & Plan: RLL opacity on CXR, leukocytosis:  - Tx with ceftriaxone. The patient is on supplemental oxygen, though has had no respiratory symptoms or documented hypoxemia. Procalcitonin is undetectable. If able to confirm we're able to discontinue oxygen, would consider DC abx.   Superior and inferior left pubic rami fractures: Question of left acetabulum fracture not borne out in subsequent CT. - Dr. Linna Caprice consulted > WBAT, 4wk f/u in office. No surgery.   L1 compression fracture:  - Dr. Conchita Paris consulted > no intervention necessary.   Fall: Negative CT head/C spine, not on blood thinner.  - PT/OT > will require SNF placement. Pending.  Chronic HFrEF:  - Continue losartan chronic med, give home dose of lasix today.   Tyrone Nine, MD [facility] [email or web address] 12/04/2022, 2:04 PM

## 2022-12-04 NOTE — TOC Progression Note (Addendum)
Transition of Care [facility]) - Progression Note    Patient Details  Name: Catherine West MRN: [id number] Date of Birth: 1920-10-31  [facility]) CM/SW Contact  Catherine Gravel, LCSW Phone Number: 12/04/2022, 2:03 PM  Clinical Narrative:     CSW spoke to son and daughter later.  Advised Auth pending.  Daughter Olegario Messier requesting to be called.   Addendum:CSW shared 2 facility offers with son and Olegario Messier.  Olegario Messier prefers to be called- [facility] and [facility] accepted. Auth pending.  Family wants [facility] - response pending.    Barriers to Discharge: Continued Medical Work up  Expected Discharge Plan and Services In-house Referral: Clinical Social Work   Post Acute Care Choice: Skilled Nursing Facility Living arrangements for the past 2 months: Assisted Living Facility                                       Social Determinants of Health (SDOH) Interventions SDOH Screenings   Food Insecurity: No Food Insecurity (09/05/2022)  Housing: Low Risk  (09/05/2022)  Transportation Needs: No Transportation Needs (09/05/2022)  Utilities: Not At Risk (09/05/2022)  Tobacco Use: Low Risk  (12/02/2022)    Readmission Risk Interventions     No data to display

## 2022-12-04 NOTE — Progress Notes (Signed)
Attempted to wean patient from O2, unsuccessful. Patient's O2 at [facility] was 87-88%. Replaced [place] at 1L.

## 2022-12-05 DIAGNOSIS — S329XXA Fracture of unspecified parts of lumbosacral spine and pelvis, initial encounter for closed fracture: Secondary | ICD-10-CM | POA: Diagnosis not present

## 2022-12-05 DIAGNOSIS — S32010A Wedge compression fracture of first lumbar vertebra, initial encounter for closed fracture: Secondary | ICD-10-CM | POA: Diagnosis not present

## 2022-12-05 MED ORDER — SENNOSIDES-DOCUSATE SODIUM 8.6-50 MG PO TABS
2.0000 | ORAL_TABLET | Freq: Two times a day (BID) | ORAL | Status: DC
  Administered 2022-12-05 – 2022-12-07 (×5): 2 via ORAL
  Filled 2022-12-05 (×5): qty 2

## 2022-12-05 MED ORDER — ACETAMINOPHEN 325 MG PO TABS
650.0000 mg | ORAL_TABLET | Freq: Three times a day (TID) | ORAL | Status: DC
  Administered 2022-12-05 – 2022-12-07 (×7): 650 mg via ORAL
  Filled 2022-12-05 (×7): qty 2

## 2022-12-05 MED ORDER — POLYETHYLENE GLYCOL 3350 17 G PO PACK
17.0000 g | PACK | Freq: Every day | ORAL | Status: DC
  Administered 2022-12-07: 17 g via ORAL
  Filled 2022-12-05 (×2): qty 1

## 2022-12-05 NOTE — TOC Progression Note (Signed)
Transition of Care [facility]) - Progression Note    Patient Details  Name: Catherine West MRN: [id number] Date of Birth: 1920/07/23  Transition of Care [facility]) CM/SW Contact  Leitha Bleak, RN Phone Number: 12/05/2022, 3:45 PM  Clinical Narrative:   Discussed bed offers with her son. The family really wants [facility], the offer is pending. TOC reached out to [facility] for an offer. Son requested we call her sister, Olegario Messier. Patient needs [facility] AUTH. TOC following.       Barriers to Discharge: Continued Medical Work up  Expected Discharge Plan and Services In-house Referral: Clinical Social Work   Post Acute Care Choice: Skilled Nursing Facility Living arrangements for the past 2 months: Assisted Living Facility                                       Social Determinants of Health (SDOH) Interventions SDOH Screenings   Food Insecurity: No Food Insecurity (09/05/2022)  Housing: Low Risk  (09/05/2022)  Transportation Needs: No Transportation Needs (09/05/2022)  Utilities: Not At Risk (09/05/2022)  Tobacco Use: Low Risk  (12/02/2022)    Readmission Risk Interventions     No data to display

## 2022-12-05 NOTE — Progress Notes (Signed)
Pt tolerated po med's whole with sips of water great with no coughing afterwards. Pt reports pain with movement otherwise pain is minimal. Denies any need for pain medication at this present time.

## 2022-12-05 NOTE — Evaluation (Signed)
Clinical/Bedside Swallow Evaluation Patient Details  Name: Catherine West MRN: [id number] Date of Birth: 1921/01/06  Today's Date: 12/05/2022 Time: SLP Start Time (ACUTE ONLY): 1726 SLP Stop Time (ACUTE ONLY): 1749 SLP Time Calculation (min) (ACUTE ONLY): 23 min  Past Medical History:  Past Medical History:  Diagnosis Date   Acute bronchitis    Acute bronchitis    Chronic kidney disease, stage 2 (mild)    Chronic kidney disease, unspecified    Cough    Essential (primary) hypertension    HTN (hypertension)    Hyperlipidemia, unspecified    Hypertensive chronic kidney disease with stage 1 through stage 4 chronic kidney disease, or unspecified chronic kidney disease    Hypothyroidism, unspecified    Major depressive disorder, single episode, unspecified    Osteoporosis    Osteoporosis    Pneumonia    Pneumonia    Urinary tract infection    Urinary tract infection    Past Surgical History:  Past Surgical History:  Procedure Laterality Date   LESION REMOVAL Left 11/10/2022   Procedure: LESION REMOVAL TRUNK;  Surgeon: [name], MD;  Location: [facility];  Service: General;  Laterality: Left;  left lower trunk   HPI:  89 y.o. female who presented to the ED from [place] [facility] on 12/02/2022 after an unwitnessed fall. She was found to have compression fracture of L1 and left pelvis fracture. Orthopedics recommends WBAT with a walker and follow up as outpatient. She will require a higher level of care, and was admitted to pursue this and confirm adequate pain control. RLL opacity on CXR, leukocytosis. BSE requested.    Assessment / Plan / Recommendation  Clinical Impression  Clinical swallow evaluation completed in room with family present. Her daugter was feeding her a dinner meal upon SLP arrival. Oral motor examination is WNL and Pt has her own dentition (missing some). She denies difficulty swallowing. Pt consumed regular textures and thin liquids without overt signs of aspiration and  no reports of globus. Her daughter was feeding her, but she typically feeds herself at [place]. She takes her medications crushed as able in puree. Recommend continue regular textures and thin liquids with standard aspiration and reflux precautions. No further SLP services indicated at this time. SLP Visit Diagnosis: Dysphagia, unspecified (R13.10)    Aspiration Risk  No limitations    Diet Recommendation Regular;Thin liquid    Liquid Administration via: Cup;Straw Medication Administration: Crushed with puree Supervision: Full supervision/cueing for compensatory strategies Compensations: Slow rate;Small sips/bites Postural Changes: Seated upright at 90 degrees;Remain upright for at least 30 minutes after po intake    Other  Recommendations Oral Care Recommendations: Oral care BID    Recommendations for follow up therapy are one component of a multi-disciplinary discharge planning process, led by the attending physician.  Recommendations may be updated based on patient status, additional functional criteria and insurance authorization.  Follow up Recommendations No SLP follow up      Assistance Recommended at Discharge    Functional Status Assessment Patient has not had a recent decline in their functional status  Frequency and Duration            Prognosis Prognosis for improved oropharyngeal function: Good      Swallow Study   General Date of Onset: 12/02/22 HPI: 89 y.o. female who presented to the ED from [place] [facility] on 12/02/2022 after an unwitnessed fall. She was found to have compression fracture of L1 and left pelvis fracture. Orthopedics recommends WBAT with a walker  and follow up as outpatient. She will require a higher level of care, and was admitted to pursue this and confirm adequate pain control. RLL opacity on CXR, leukocytosis. BSE requested. Type of Study: Bedside Swallow Evaluation Previous Swallow Assessment: N/A Diet Prior to this Study: Regular;Thin liquids  (Level 0) Temperature Spikes Noted: No Respiratory Status: Nasal cannula History of Recent Intubation: No Behavior/Cognition: Alert;Cooperative;Pleasant mood Oral Cavity Assessment: Within Functional Limits Oral Care Completed by SLP: Recent completion by staff Oral Cavity - Dentition: Adequate natural dentition Vision: Functional for self-feeding Self-Feeding Abilities: Needs assist Patient Positioning: Upright in bed Baseline Vocal Quality: Normal Volitional Cough: Strong Volitional Swallow: Able to elicit    Oral/Motor/Sensory Function Overall Oral Motor/Sensory Function: Within functional limits   Ice Chips Ice chips: Within functional limits Presentation: Spoon   Thin Liquid Thin Liquid: Within functional limits Presentation: Cup;Self Fed;Straw    Nectar Thick Nectar Thick Liquid: Not tested   Honey Thick Honey Thick Liquid: Not tested   Puree Puree: Within functional limits Presentation: Spoon   Solid     Solid: Within functional limits Presentation: Self Fed     Thank you,  Havery Moros, CCC-SLP [phone]  , 12/05/2022,5:55 PM

## 2022-12-05 NOTE — Progress Notes (Addendum)
[facility] PROGRESS NOTE   Catherine West [id number]:[id number] DOB: Sep 13, 1920 DOA: 12/02/2022  PCP: Benita Stabile, MD  Brief History/Interval Summary: 89 y.o. female who presented to the ED from [place] [facility] on 12/02/2022 after an unwitnessed fall. She was found to have compression fracture of L1 and left pelvis fracture. Orthopedics recommends WBAT with a walker and follow up as outpatient. She will require a higher level of care, and was admitted to pursue this and confirm adequate pain control.     Subjective/Interval History: Patient mentions that she has significant pain in her back and her pelvic area when she moves around.  Otherwise she feels well.  No nausea vomiting.    Assessment/Plan:  RLL opacity on CXR, leukocytosis:  WBC is improved today.  Procalcitonin less than 0.1.  Patient noted to be on ceftriaxone.  Continue for now.   Speech therapy evaluation. Continues to require oxygen.   Superior and inferior left pubic rami fractures Question of left acetabulum fracture not borne out in subsequent CT. - Dr. Linna Caprice consulted > WBAT, 4wk f/u in office. No surgery.    L1 compression fracture:  - Dr. Conchita Paris consulted > no intervention necessary.    Fall: Negative CT head/C spine, not on blood thinner.  - PT/OT > will require SNF placement. Pending.   Chronic HFrEF/essential hypertension Looks like patient is no longer on losartan which will be discontinued.  Continue with amlodipine. Also looks like she is no longer on furosemide.  Chronic kidney disease stage IIIb Stable creatinine.  Monitor urine output.  Avoid nephrotoxic agents.  Macrocytic anemia Drop in hemoglobin is likely dilutional.  No evidence of overt bleeding.  Recheck labs tomorrow. Check anemia panel.  TSH normal at 3.8.  DVT Prophylaxis: Subcutaneous heparin Code Status: DNR Family Communication: Discussed with patient Disposition Plan: SNF    Medications: Scheduled:  amLODipine  5  mg Oral Daily   heparin  5,000 Units Subcutaneous Q8H   losartan  100 mg Oral Daily   polyethylene glycol  17 g Oral Daily   senna-docusate  2 tablet Oral BID   sertraline  100 mg Oral Daily   Continuous:  cefTRIAXone (ROCEPHIN)  IV 2 g (12/05/22 5784)   ONG:[id number] **OR** acetaminophen, ondansetron **OR** ondansetron (ZOFRAN) IV, mouth rinse, oxyCODONE  Antibiotics: Anti-infectives (From admission, onward)    Start     Dose/Rate Route Frequency Ordered Stop   12/03/22 0715  cefTRIAXone (ROCEPHIN) 2 g in sodium chloride 0.9 % 100 mL IVPB        2 g 200 mL/hr over 30 Minutes Intravenous Every 24 hours 12/03/22 0623         Objective:  Vital Signs  Vitals:   12/04/22 1700 12/04/22 1800 12/04/22 1937 12/05/22 0502  BP:   (!) 155/53 (!) 154/54  Pulse:   63 60  Resp:   16 16  Temp:   98.2 F (36.8 C) 98.4 F (36.9 C)  TempSrc:   Oral Oral  SpO2: 95% (!) 87% 93% 90%  Weight:      Height:        Intake/Output Summary (Last 24 hours) at 12/05/2022 1102 Last data filed at 12/05/2022 0900 Gross per 24 hour  Intake 360 ml  Output 850 ml  Net -490 ml   Filed Weights   12/02/22 2335  Weight: 57.2 kg    General appearance: Awake alert.  In no distress Resp: Diminished air entry at the bases.  Few crackles.  No wheezing  or rhonchi. Cardio: S1-S2 is normal regular.  No S3-S4.  No rubs murmurs or bruit GI: Abdomen is soft.  Nontender nondistended.  Bowel sounds are present normal.  No masses organomegaly Extremities: Limited range of motion of lower extremities. No obvious focal neurological deficits.   Lab Results:  Data Reviewed: I have personally reviewed following labs and reports of the imaging studies  CBC: Recent Labs  Lab 12/03/22 0010 12/04/22 0343  WBC 19.8* 9.6  NEUTROABS 16.5* 6.8  HGB 12.4 10.6*  HCT 39.1 33.6*  MCV 101.3* 102.1*  PLT PLATELET CLUMPS NOTED ON SMEAR, COUNT APPEARS ADEQUATE 151    Basic Metabolic Panel: Recent Labs  Lab  12/03/22 0010 12/04/22 0343  NA 136 138  K 4.3 3.9  CL 100 102  CO2 26 28  GLUCOSE 156* 109*  BUN 25* 23  CREATININE 1.37* 1.31*  CALCIUM 8.6* 8.4*  MG  --  2.1    GFR: Estimated Creatinine Clearance: 18.1 mL/min (A) (by C-G formula based on SCr of 1.31 mg/dL (H)).  Liver Function Tests: Recent Labs  Lab 12/04/22 0343  AST 17  ALT 10  ALKPHOS 55  BILITOT 0.9  PROT 6.3*  ALBUMIN 3.0*     Thyroid Function Tests: Recent Labs    12/03/22 0624  TSH 3.810     Radiology Studies: No results found.     LOS: 2 days    Rito Ehrlich  [facility] Pager on [email or web address]  12/05/2022, 11:02 AM

## 2022-12-06 ENCOUNTER — Other Ambulatory Visit: Payer: Self-pay

## 2022-12-06 DIAGNOSIS — S32010A Wedge compression fracture of first lumbar vertebra, initial encounter for closed fracture: Secondary | ICD-10-CM | POA: Diagnosis not present

## 2022-12-06 MED ORDER — CEFDINIR 300 MG PO CAPS: 300.0000 mg | ORAL_CAPSULE | Freq: Two times a day (BID) | ORAL | Status: DC

## 2022-12-06 MED ORDER — FOLIC ACID 1 MG PO TABS
1.0000 mg | ORAL_TABLET | Freq: Every day | ORAL | Status: DC
  Administered 2022-12-06 – 2022-12-07 (×2): 1 mg via ORAL
  Filled 2022-12-06 (×2): qty 1

## 2022-12-06 MED ORDER — FOLIC ACID 1 MG PO TABS: 1.0000 mg | ORAL_TABLET | Freq: Every day | ORAL | Status: AC

## 2022-12-06 MED ORDER — FAMOTIDINE 20 MG PO TABS: 20.0000 mg | ORAL_TABLET | Freq: Every day | ORAL | Status: DC

## 2022-12-06 MED ORDER — CEFDINIR 300 MG PO CAPS
300.0000 mg | ORAL_CAPSULE | Freq: Every day | ORAL | Status: DC
  Administered 2022-12-07: 300 mg via ORAL
  Filled 2022-12-06: qty 1

## 2022-12-06 MED ORDER — CAMPHOR-MENTHOL 0.5-0.5 % EX LOTN: TOPICAL_LOTION | CUTANEOUS | Status: DC | PRN

## 2022-12-06 MED ORDER — AMLODIPINE BESYLATE 10 MG PO TABS: 10.0000 mg | ORAL_TABLET | Freq: Every day | ORAL | Status: DC

## 2022-12-06 MED ORDER — CAMPHOR-MENTHOL 0.5-0.5 % EX LOTN
TOPICAL_LOTION | CUTANEOUS | Status: DC | PRN
  Filled 2022-12-06: qty 222

## 2022-12-06 MED ORDER — SENNOSIDES-DOCUSATE SODIUM 8.6-50 MG PO TABS: 2.0000 | ORAL_TABLET | Freq: Two times a day (BID) | ORAL | Status: DC

## 2022-12-06 MED ORDER — POLYETHYLENE GLYCOL 3350 17 G PO PACK: 17.0000 g | PACK | Freq: Every day | ORAL | Status: DC

## 2022-12-06 MED ORDER — CYANOCOBALAMIN 500 MCG PO TABS: 500.0000 ug | ORAL_TABLET | Freq: Every day | ORAL | Status: DC

## 2022-12-06 MED ORDER — CEFDINIR 300 MG PO CAPS: 300.0000 mg | ORAL_CAPSULE | Freq: Every day | ORAL | Status: DC

## 2022-12-06 MED ORDER — OXYCODONE HCL 5 MG PO TABS: 5.0000 mg | ORAL_TABLET | Freq: Four times a day (QID) | ORAL | 0 refills | Status: DC | PRN

## 2022-12-06 MED ORDER — AMLODIPINE BESYLATE 5 MG PO TABS
10.0000 mg | ORAL_TABLET | Freq: Every day | ORAL | Status: DC
  Administered 2022-12-07: 10 mg via ORAL
  Filled 2022-12-06: qty 2

## 2022-12-06 MED ORDER — VITAMIN B-12 100 MCG PO TABS
500.0000 ug | ORAL_TABLET | Freq: Every day | ORAL | Status: DC
  Administered 2022-12-06 – 2022-12-07 (×2): 500 ug via ORAL
  Filled 2022-12-06 (×2): qty 5

## 2022-12-06 MED ORDER — ACETAMINOPHEN 325 MG PO TABS: 650.0000 mg | ORAL_TABLET | Freq: Three times a day (TID) | ORAL | Status: AC

## 2022-12-06 NOTE — Discharge Summary (Signed)
[facility]  Physician Discharge Summary   Patient ID: Catherine West MRN: [id number] DOB/AGE: [birth year] 89 y.o.  Admit date: 12/02/2022 Discharge date:   12/06/2022   PCP: Benita Stabile, MD  DISCHARGE DIAGNOSES:    Essential (primary) hypertension   Major depressive disorder, single episode, unspecified   Compression fracture of L1 lumbar vertebra (HCC) Left pubic rami fracture  RECOMMENDATIONS FOR OUTPATIENT FOLLOW UP: Follow-up with Dr. Linna Caprice in 4 weeks Check CBC and basic metabolic panel in 1 week   Home Health: SNF Equipment/Devices: None  CODE STATUS: Full code  DISCHARGE CONDITION: fair  Diet recommendation: Regular as tolerated  INITIAL HISTORY: 89 y.o. female who presented to the ED from [facility] on 12/02/2022 after an unwitnessed fall. She was found to have compression fracture of L1 and left pelvis fracture. Orthopedics recommends WBAT with a walker and follow up as outpatient. She will require a higher level of care, and was admitted to pursue this and confirm adequate pain control.     HOSPITAL COURSE:   RLL opacity on CXR, leukocytosis/acute respiratory failure with hypoxia WBC improved.  Respiratory status is stable.  Still requiring 2 L of oxygen via nasal cannula. Change over to oral antibiotics today. Seen by speech therapy. Hopefully oxygen can be weaned off once pneumonia has been treated.   Superior and inferior left pubic rami fractures Question of left acetabulum fracture not borne out in subsequent CT. Dr. Linna Caprice consulted > WBAT, 4wk f/u in office. No surgery.  Pain control.  Bowel regimen.   L1 compression fracture - Dr. Conchita Paris consulted > no intervention necessary.    Chronic HFrEF/essential hypertension Looks like patient is no longer on losartan which will be discontinued.  Continue with amlodipine. Also looks like she is no longer on furosemide. Elevated blood pressure readings noted.  Will increase the dose of  amlodipine.   Chronic kidney disease stage IIIb Stable creatinine.  Monitor urine output.  Avoid nephrotoxic agents.   Macrocytic anemia/folic acid deficiency Drop in hemoglobin is likely dilutional.  No evidence of overt bleeding.  Hemoglobin is stable.  TSH is normal at 3.8.  Anemia panel does show folic acid deficiency and low normal vitamin B12 levels.  These will be supplemented.    Patient is stable.  Okay for discharge to SNF when bed is available.   PERTINENT LABS:  The results of significant diagnostics from this hospitalization (including imaging, microbiology, ancillary and laboratory) are listed below for reference.      Labs:   Basic Metabolic Panel: Recent Labs  Lab 12/03/22 0010 12/04/22 0343 12/06/22 0415  NA 136 138 139  K 4.3 3.9 3.7  CL 100 102 105  CO2 26 28 28   GLUCOSE 156* 109* 89  BUN 25* 23 22  CREATININE 1.37* 1.31* 1.14*  CALCIUM 8.6* 8.4* 8.3*  MG  --  2.1 2.2   Liver Function Tests: Recent Labs  Lab 12/04/22 0343  AST 17  ALT 10  ALKPHOS 55  BILITOT 0.9  PROT 6.3*  ALBUMIN 3.0*    CBC: Recent Labs  Lab 12/03/22 0010 12/04/22 0343 12/06/22 0415  WBC 19.8* 9.6 10.6*  NEUTROABS 16.5* 6.8  --   HGB 12.4 10.6* 11.0*  HCT 39.1 33.6* 34.9*  MCV 101.3* 102.1* 102.0*  PLT PLATELET CLUMPS NOTED ON SMEAR, COUNT APPEARS ADEQUATE 151 162     IMAGING STUDIES CT Lumbar Spine Wo Contrast  Result Date: 12/03/2022 CLINICAL DATA:  Fall and trauma to the back. EXAM:  CT LUMBAR SPINE WITHOUT CONTRAST TECHNIQUE: Multidetector CT imaging of the lumbar spine was performed without intravenous contrast administration. Multiplanar CT image reconstructions were also generated. RADIATION DOSE REDUCTION: This exam was performed according to the departmental dose-optimization program which includes automated exposure control, adjustment of the mA and/or kV according to patient size and/or use of iterative reconstruction technique. COMPARISON:  CT abdomen  pelvis dated 09/04/2022. FINDINGS: Segmentation: 5 lumbar type vertebrae. Alignment: No acute subluxation. Vertebrae: There is compression fracture of inferior endplate of L1, significantly progressed since the prior CT. There is near complete loss of vertebral body height centrally. This is concerning for an acute or subacute on chronic fracture. Correlation with clinical exam and point tenderness recommended. There is approximately 3 mm retropulsion of the posteroinferior endplate. Additionally there is linear lucency along the inferior edge of the left sacral ala (135/6 and 56/9) suspicious for an acute fracture. There is advanced osteopenia. Paraspinal and other soft tissues: No paraspinal fluid collection or hematoma. There is advanced atherosclerotic calcification of the visualized aorta. Disc levels: There is degenerative changes. Disc desiccation and vacuum phenomena at T12-L1 and L1-L2. IMPRESSION: 1. Progression of compression fracture of the inferior endplate of L1 since the prior CT of 09/04/2022. This likely represents an acute or subacute fracture. Correlation with point tenderness recommended. A 3 mm retropulsion of the posteroinferior endplate. 2. Suspected acute fracture of the left sacral ala. 3. Advanced osteopenia. 4.  Aortic Atherosclerosis (ICD10-I70.0). Electronically Signed   By: Elgie Collard M.D.   On: 12/03/2022 01:41   CT Hip Left Wo Contrast  Result Date: 12/03/2022 CLINICAL DATA:  Hip trauma fall EXAM: CT OF THE LEFT HIP WITHOUT CONTRAST TECHNIQUE: Multidetector CT imaging of the left hip was performed according to the standard protocol. Multiplanar CT image reconstructions were also generated. RADIATION DOSE REDUCTION: This exam was performed according to the departmental dose-optimization program which includes automated exposure control, adjustment of the mA and/or kV according to patient size and/or use of iterative reconstruction technique. COMPARISON:  Radiograph 12/03/2022  FINDINGS: Bones/Joint/Cartilage Acute comminuted left superior and inferior pubic rami fractures with extension of lucency to the pubic symphysis but no symphyseal widening. No acetabular fracture. Proximal femur is intact. No sizable hip effusion Ligaments Suboptimally assessed by CT. Muscles and Tendons No intramuscular collections.  Mild atrophy. Soft tissues Skin thickening and subcutaneous edema at the left posterolateral hip most likely a contusion. No significant pelvic hematoma. IMPRESSION: Acute comminuted left superior and inferior pubic rami fractures with extension to the pubic symphysis but no symphyseal widening. No other discrete fractures are visualized. Electronically Signed   By: Jasmine Pang M.D.   On: 12/03/2022 01:34   CT HEAD WO CONTRAST ( )  Result Date: 12/03/2022 CLINICAL DATA:  Head trauma, minor (Age >= 65y); Neck trauma (Age >= 65y) EXAM: CT HEAD WITHOUT CONTRAST CT CERVICAL SPINE WITHOUT CONTRAST TECHNIQUE: Multidetector CT imaging of the head and cervical spine was performed following the standard protocol without intravenous contrast. Multiplanar CT image reconstructions of the cervical spine were also generated. RADIATION DOSE REDUCTION: This exam was performed according to the departmental dose-optimization program which includes automated exposure control, adjustment of the mA and/or kV according to patient size and/or use of iterative reconstruction technique. COMPARISON:  CT head 11/04/2019, CT head 02/22/2018 FINDINGS: CT HEAD FINDINGS Brain: Cerebral ventricle sizes are concordant with the degree of cerebral volume loss. Patchy and confluent areas of decreased attenuation are noted throughout the deep and periventricular white matter of the  cerebral hemispheres bilaterally, compatible with chronic microvascular ischemic disease. No evidence of large-territorial acute infarction. No parenchymal hemorrhage. No mass lesion. No extra-axial collection. No mass effect or midline  shift. No hydrocephalus. Basilar cisterns are patent. Vascular: No hyperdense vessel. Atherosclerotic calcifications are present within the cavernous internal carotid and vertebral arteries. Skull: No acute fracture or focal lesion. Sinuses/Orbits: Paranasal sinuses and mastoid air cells are clear. The orbits are unremarkable. Other: None. CT CERVICAL SPINE FINDINGS Alignment: Normal. Skull base and vertebrae: Multilevel moderate degenerative changes spine with associated severe osseous neural foraminal stenosis at the left C3-C4 level. No acute fracture. No aggressive appearing focal osseous lesion or focal pathologic process. Soft tissues and spinal canal: No prevertebral fluid or swelling. No visible canal hematoma. Upper chest: Unremarkable. Other: Atherosclerotic plaque of the carotid arteries within the neck. IMPRESSION: 1. No acute intracranial abnormality. 2. No acute displaced fracture or traumatic listhesis of the cervical spine. 3. Multilevel moderate degenerative changes spine with associated severe osseous neural foraminal stenosis at the left C3-C4 level. Electronically Signed   By: Tish Frederickson M.D.   On: 12/03/2022 01:17   CT CERVICAL SPINE WO CONTRAST  Result Date: 12/03/2022 CLINICAL DATA:  Head trauma, minor (Age >= 65y); Neck trauma (Age >= 65y) EXAM: CT HEAD WITHOUT CONTRAST CT CERVICAL SPINE WITHOUT CONTRAST TECHNIQUE: Multidetector CT imaging of the head and cervical spine was performed following the standard protocol without intravenous contrast. Multiplanar CT image reconstructions of the cervical spine were also generated. RADIATION DOSE REDUCTION: This exam was performed according to the departmental dose-optimization program which includes automated exposure control, adjustment of the mA and/or kV according to patient size and/or use of iterative reconstruction technique. COMPARISON:  CT head 11/04/2019, CT head 02/22/2018 FINDINGS: CT HEAD FINDINGS Brain: Cerebral ventricle sizes  are concordant with the degree of cerebral volume loss. Patchy and confluent areas of decreased attenuation are noted throughout the deep and periventricular white matter of the cerebral hemispheres bilaterally, compatible with chronic microvascular ischemic disease. No evidence of large-territorial acute infarction. No parenchymal hemorrhage. No mass lesion. No extra-axial collection. No mass effect or midline shift. No hydrocephalus. Basilar cisterns are patent. Vascular: No hyperdense vessel. Atherosclerotic calcifications are present within the cavernous internal carotid and vertebral arteries. Skull: No acute fracture or focal lesion. Sinuses/Orbits: Paranasal sinuses and mastoid air cells are clear. The orbits are unremarkable. Other: None. CT CERVICAL SPINE FINDINGS Alignment: Normal. Skull base and vertebrae: Multilevel moderate degenerative changes spine with associated severe osseous neural foraminal stenosis at the left C3-C4 level. No acute fracture. No aggressive appearing focal osseous lesion or focal pathologic process. Soft tissues and spinal canal: No prevertebral fluid or swelling. No visible canal hematoma. Upper chest: Unremarkable. Other: Atherosclerotic plaque of the carotid arteries within the neck. IMPRESSION: 1. No acute intracranial abnormality. 2. No acute displaced fracture or traumatic listhesis of the cervical spine. 3. Multilevel moderate degenerative changes spine with associated severe osseous neural foraminal stenosis at the left C3-C4 level. Electronically Signed   By: Tish Frederickson M.D.   On: 12/03/2022 01:17   DG Hip Unilat W or Wo Pelvis 2-3 Views Left  Result Date: 12/03/2022 CLINICAL DATA:  Fall with left-sided hip pain EXAM: DG HIP (WITH OR WITHOUT PELVIS) 2-3V LEFT COMPARISON:  CT 09/04/2022 FINDINGS: SI joints are non widened. Pubic symphysis appears intact. Acute left superior and inferior pubic rami fractures near the pubic symphysis. Question medial left acetabular  fracture. No femoral head dislocation. Vascular calcifications IMPRESSION: Acute left  superior and inferior pubic rami fractures approaching the symphysis. Question medial left acetabular fracture. Recommend CT for further assessment Electronically Signed   By: Jasmine Pang M.D.   On: 12/03/2022 00:31   DG Chest 1 View  Result Date: 12/03/2022 CLINICAL DATA:  Fall with left-sided hip pain EXAM: CHEST  1 VIEW COMPARISON:  09/04/2022, CT 10/14/2013, 02/22/2018 FINDINGS: Mild cardiomegaly with aortic atherosclerosis. Possible patchy infiltrate in the right lower lung. Trace right pleural effusion or thickening. No pneumothorax IMPRESSION: Possible patchy infiltrate in the right lower lung. Trace right pleural effusion or thickening. Cardiomegaly Electronically Signed   By: Jasmine Pang M.D.   On: 12/03/2022 00:29    DISCHARGE EXAMINATION: Vitals:   12/05/22 0502 12/05/22 1215 12/05/22 2031 12/06/22 0447  BP: (!) 154/54 (!) 158/68 (!) 172/72 (!) 161/63  Pulse: 60 (!) 57 (!) 58 (!) 54  Resp: 16 18 17 18   Temp: 98.4 F (36.9 C) 98 F (36.7 C) 98.1 F (36.7 C) 98 F (36.7 C)  TempSrc: Oral Oral    SpO2: 90% 97% 95% 97%  Weight:      Height:       General appearance: Awake alert.  In no distress Resp: Clear to auscultation bilaterally.  Normal effort Cardio: S1-S2 is normal regular.  No S3-S4.  No rubs murmurs or bruit GI: Abdomen is soft.  Nontender nondistended.  Bowel sounds are present normal.  No masses organomegaly Decreased mobility due to her fractures   DISPOSITION: SNF  Discharge Instructions     Call MD for:  difficulty breathing, headache or visual disturbances   Complete by: As directed    Call MD for:  extreme fatigue   Complete by: As directed    Call MD for:  persistant dizziness or light-headedness   Complete by: As directed    Call MD for:  persistant nausea and vomiting   Complete by: As directed    Call MD for:  severe uncontrolled pain   Complete by: As  directed    Call MD for:  temperature >100.4   Complete by: As directed    Diet - low sodium heart healthy   Complete by: As directed    Discharge instructions   Complete by: As directed    Please review instructions on the discharge summary.  You were cared for by a hospitalist during your hospital stay. If you have any questions about your discharge medications or the care you received while you were in the hospital after you are discharged, you can call the unit and asked to speak with the hospitalist on call if the hospitalist that took care of you is not available. Once you are discharged, your primary care physician will handle any further medical issues. Please note that NO REFILLS for any discharge medications will be authorized once you are discharged, as it is imperative that you return to your primary care physician (or establish a relationship with a primary care physician if you do not have one) for your aftercare needs so that they can reassess your need for medications and monitor your lab values. If you do not have a primary care physician, you can call [phone] for a physician referral.   Increase activity slowly   Complete by: As directed          Allergies as of 12/06/2022   No Known Allergies      Medication List     STOP taking these medications    furosemide 20 MG tablet  Commonly known as: LASIX   HYDROcodone-acetaminophen 5-325 MG tablet Commonly known as: NORCO/VICODIN   losartan 100 MG tablet Commonly known as: COZAAR   Voltaren 1 % Gel Generic drug: diclofenac Sodium       TAKE these medications    acetaminophen 325 MG tablet Commonly known as: TYLENOL Take 2 tablets (650 mg total) by mouth 3 (three) times daily for 3 days.   amLODipine 10 MG tablet Commonly known as: NORVASC Take 1 tablet (10 mg total) by mouth daily. Start taking on: December 07, 2022 What changed:  medication strength how much to take   camphor-menthol lotion Commonly  known as: SARNA Apply topically as needed for itching.   cefdinir 300 MG capsule Commonly known as: OMNICEF Take 1 capsule (300 mg total) by mouth daily. Start taking on: December 07, 2022   cyanocobalamin 500 MCG tablet Commonly known as: VITAMIN B12 Take 1 tablet (500 mcg total) by mouth daily.   famotidine 20 MG tablet Commonly known as: PEPCID Take 1 tablet (20 mg total) by mouth daily. What changed:  medication strength how much to take   folic acid 1 MG tablet Commonly known as: FOLVITE Take 1 tablet (1 mg total) by mouth daily.   loperamide 2 MG tablet Commonly known as: IMODIUM A-D Take 2 mg by mouth every 6 (six) hours as needed for diarrhea or loose stools.   oxyCODONE 5 MG immediate release tablet Commonly known as: Oxy IR/ROXICODONE Take 1 tablet (5 mg total) by mouth every 6 (six) hours as needed for severe pain.   polyethylene glycol 17 g packet Commonly known as: MIRALAX / GLYCOLAX Take 17 g by mouth daily.   senna-docusate 8.6-50 MG tablet Commonly known as: Senokot-S Take 2 tablets by mouth 2 (two) times daily.   sertraline 100 MG tablet Commonly known as: ZOLOFT Take 100 mg by mouth daily.   Vitamin D 50 MCG (2000 UT) Caps Take 2,000 Units by mouth daily.          Contact information for follow-up providers     Benita Stabile, MD. Schedule an appointment as soon as possible for a visit in 1 week(s).   Specialty: Internal Medicine Why: post hospitalization follow up Contact information: [address] [place] [facility] [address] [phone]              Contact information for after-discharge care     Destination     [facility] Preferred SNF .   Service: Skilled Nursing Contact information: [address] [place] [address] [phone]                     TOTAL DISCHARGE TIME: 35 minutes   Rito Ehrlich  [facility] Pager on [email or web address]  12/06/2022, 11:08 AM

## 2022-12-06 NOTE — Consult Note (Signed)
[facility] Customer service manager [facility]) Accountable Care Organization (ACO) [place] [facility] Liaison Note  12/06/2022  MAZIE LASSETER March 26, 1921 [id number]  Location: [facility] RN Hospital Liaison screened the patient remotely at [facility].  Insurance: [employer] [facility] Medicare Advantage   Catherine West is a 89 y.o. female who is a Primary Care Patient of Benita Stabile, MD. The patient was screened for 30 day readmission hospitalization with noted medium risk score for unplanned readmission risk with 2 IP/1 ED in 6 months.  The patient was assessed for potential [facility]) Care Management service needs for post hospital transition for care coordination. Review of patient's electronic medical record reveals patient was admitted for a fall resulting in a closed fracture. Currently in pending offered with a bedsearch.   Plan: [facility] Hospital Liaison will continue to follow progress and disposition to asess for post hospital community care coordination/management needs.  Referral request for community care coordination: pending disposition.   [facility] Care Management/Population Health does not replace or interfere with any arrangements made by the Inpatient Transition of Care team.   For questions contact:   Elliot Cousin, RN, [facility] Liaison [place]   Population Health Office Hours MTWF  8:00 am-6:00 pm Off on Thursday [phone] mobile [phone] [Office toll free line] Office Hours are M-F 8:30 - 5 pm 24 hour nurse advise line [phone] [email or web address]

## 2022-12-06 NOTE — Plan of Care (Signed)

## 2022-12-06 NOTE — Progress Notes (Signed)
Physical Therapy Treatment Patient Details Name: Catherine West MRN: [id number] DOB: [birth year] Today's Date: 12/06/2022   History of Present Illness Catherine West is a 89 y.o. female with medical history significant of CKD, hypertension, hyperlipidemia, hypothyroidism, osteoporosis, depression, and more presents to the ED with a chief complaint of unwitnessed fall.  Patient presents from facility with an unwitnessed fall falling backwards and hitting her head.  Patient denies loss of consciousness.  She is alert and oriented.  She reports she does have low back pain and hip pain.  Patient is not on blood thinners.  Patient is not very talkative.  She reports that prior to her fall she had no chest pain, palpitations, dyspnea, dizziness.  She reports she was just getting ready for bed when she went around the corner she tripped and fell backwards.  She is not exactly sure what happened.  Later she says maybe she lost her balance.  She reports she did not have loss of consciousness.  She is not on blood thinners.  She reports that her pain is well-controlled at this time.  Patient has no other complaints at this time.    PT Comments  Patient demonstrates slow labored movement for rolling to side and sitting up from side lying position with increased left hip and low back pain.  Patient demonstrates fair/good return for completing BLE ROM/strengthening exercises while seated at bedside with verbal cueing/demonstration, increased endurance/distance for taking steps at bedside with slow labored movement and limited mostly due to fatigue and increasing left hip pain.  Patient tolerated sitting up in chair after therapy - nurse notified.  Patient will benefit from continued skilled physical therapy in hospital and recommended venue below to increase strength, balance, endurance for safe ADLs and gait.       If plan is discharge home, recommend the following: A lot of help with walking and/or transfers;A  lot of help with bathing/dressing/bathroom;Help with stairs or ramp for entrance;Assistance with cooking/housework   Can travel by private vehicle     No  Equipment Recommendations  None recommended by PT    Recommendations for Other Services       Precautions / Restrictions Precautions Precautions: Fall Restrictions Weight Bearing Restrictions: Yes LLE Weight Bearing: Weight bearing as tolerated     Mobility  Bed Mobility Overal bed mobility: Needs Assistance Bed Mobility: Rolling, Sidelying to Sit Rolling: Mod assist Sidelying to sit: Mod assist, Max assist       General bed mobility comments: increased time, labored movement with increased left hip and low back pain    Transfers Overall transfer level: Needs assistance Equipment used: Rolling walker (2 wheels) Transfers: Sit to/from Stand, Bed to chair/wheelchair/BSC Sit to Stand: Mod assist, Min assist   Step pivot transfers: Mod assist       General transfer comment: unsteady labored movement with limited weightbearing on LLE due to increasing hip pain    Ambulation/Gait Ambulation/Gait assistance: Mod assist Gait Distance (Feet): 10 Feet Assistive device: Rolling walker (2 wheels) Gait Pattern/deviations: Decreased step length - right, Decreased step length - left, Decreased stride length Gait velocity: slow     General Gait Details: limited to a few slow labored side steps and steps forward/backward before having to sit due to fatigue and left hip pain   Stairs             Wheelchair Mobility     Tilt Bed    Modified Rankin (Stroke Patients Only)  Balance Overall balance assessment: Needs assistance Sitting-balance support: Feet supported, No upper extremity supported Sitting balance-Leahy Scale: Fair Sitting balance - Comments: fair/good seated at EOB   Standing balance support: Reliant on assistive device for balance, During functional activity, Bilateral upper extremity  supported Standing balance-Leahy Scale: Poor Standing balance comment: fair/poor using RW                            Cognition Arousal/Alertness: Awake/alert Behavior During Therapy: WFL for tasks assessed/performed Overall Cognitive Status: Within Functional Limits for tasks assessed                                          Exercises General Exercises - Lower Extremity Long Arc Quad: Seated, AROM, Strengthening, Both, 10 reps Hip Flexion/Marching: Seated, AROM, Strengthening, Both, 10 reps Toe Raises: Seated, AROM, Strengthening, Both, 10 reps Heel Raises: Seated, AROM, Strengthening, Both, 10 reps    General Comments        Pertinent Vitals/Pain Pain Assessment Pain Assessment: Faces Faces Pain Scale: Hurts even more Pain Location: left hip Pain Descriptors / Indicators: Grimacing, Guarding, Sharp, Discomfort Pain Intervention(s): Limited activity within patient's tolerance, Monitored during session, Repositioned, Patient requesting pain meds-RN notified    Home Living                          Prior Function            PT Goals (current goals can now be found in the care plan section) Acute Rehab PT Goals Patient Stated Goal: Get better. PT Goal Formulation: With patient Time For Goal Achievement: 12/17/22 Potential to Achieve Goals: Good Progress towards PT goals: Progressing toward goals    Frequency    Min 3X/week      PT Plan Current plan remains appropriate    Co-evaluation              AM-PAC PT "6 Clicks" Mobility   Outcome Measure  Help needed turning from your back to your side while in a flat bed without using bedrails?: A Lot Help needed moving from lying on your back to sitting on the side of a flat bed without using bedrails?: A Lot Help needed moving to and from a bed to a chair (including a wheelchair)?: A Lot Help needed standing up from a chair using your arms (e.g., wheelchair or bedside  chair)?: A Lot Help needed to walk in hospital room?: A Lot Help needed climbing 3-5 steps with a railing? : Total 6 Click Score: 11    End of Session   Activity Tolerance: Patient tolerated treatment well;Patient limited by fatigue Patient left: in chair;with call bell/phone within reach Nurse Communication: Mobility status PT Visit Diagnosis: Unsteadiness on feet (R26.81);Other abnormalities of gait and mobility (R26.89);Muscle weakness (generalized) (M62.81);History of falling (Z91.81)     Time: 4098-1191 PT Time Calculation (min) (ACUTE ONLY): 22 min  Charges:    $Therapeutic Exercise: 8-22 mins $Therapeutic Activity: 8-22 mins PT General Charges $$ ACUTE PT VISIT: 1 Visit                     11:57 AM, 12/06/22 Ocie Bob, MPT Physical Therapist with [facility] 336 [phone] office [phone] mobile phone

## 2022-12-06 NOTE — TOC Progression Note (Signed)
Transition of Care [facility]) - Progression Note    Patient Details  Name: Catherine West MRN: [id number] Date of Birth: 08/24/1920  Transition of [facility]) CM/SW Contact  Elliot Gault, LCSW Phone Number: 12/06/2022, 4:05 PM  Clinical Narrative:     TOC following. Awaiting SNF auth. Additional clinical sent earlier today and decision is still pending.  Updated pt's dtr/POA. Will follow up in AM.    Barriers to Discharge: Continued Medical Work up  Expected Discharge Plan and Services In-house Referral: Clinical Social Work   Post Acute Care Choice: Skilled Nursing Facility Living arrangements for the past 2 months: Assisted Living Facility Expected Discharge Date: 12/06/22                                     Social Determinants of Health (SDOH) Interventions SDOH Screenings   Food Insecurity: No Food Insecurity (09/05/2022)  Housing: Low Risk  (09/05/2022)  Transportation Needs: No Transportation Needs (09/05/2022)  Utilities: Not At Risk (09/05/2022)  Tobacco Use: Low Risk  (12/02/2022)    Readmission Risk Interventions     No data to display

## 2022-12-07 DIAGNOSIS — I130 Hypertensive heart and chronic kidney disease with heart failure and stage 1 through stage 4 chronic kidney disease, or unspecified chronic kidney disease: Secondary | ICD-10-CM | POA: Diagnosis not present

## 2022-12-07 DIAGNOSIS — C44519 Basal cell carcinoma of skin of other part of trunk: Secondary | ICD-10-CM | POA: Diagnosis not present

## 2022-12-07 DIAGNOSIS — I10 Essential (primary) hypertension: Secondary | ICD-10-CM | POA: Diagnosis not present

## 2022-12-07 DIAGNOSIS — S32010S Wedge compression fracture of first lumbar vertebra, sequela: Secondary | ICD-10-CM | POA: Diagnosis not present

## 2022-12-07 DIAGNOSIS — N1832 Chronic kidney disease, stage 3b: Secondary | ICD-10-CM | POA: Diagnosis not present

## 2022-12-07 DIAGNOSIS — R262 Difficulty in walking, not elsewhere classified: Secondary | ICD-10-CM | POA: Diagnosis not present

## 2022-12-07 DIAGNOSIS — Z741 Need for assistance with personal care: Secondary | ICD-10-CM | POA: Diagnosis not present

## 2022-12-07 DIAGNOSIS — R053 Chronic cough: Secondary | ICD-10-CM | POA: Diagnosis not present

## 2022-12-07 DIAGNOSIS — I5032 Chronic diastolic (congestive) heart failure: Secondary | ICD-10-CM | POA: Diagnosis not present

## 2022-12-07 DIAGNOSIS — E039 Hypothyroidism, unspecified: Secondary | ICD-10-CM | POA: Diagnosis not present

## 2022-12-07 DIAGNOSIS — D649 Anemia, unspecified: Secondary | ICD-10-CM | POA: Diagnosis not present

## 2022-12-07 DIAGNOSIS — S32592S Other specified fracture of left pubis, sequela: Secondary | ICD-10-CM | POA: Diagnosis not present

## 2022-12-07 DIAGNOSIS — I129 Hypertensive chronic kidney disease with stage 1 through stage 4 chronic kidney disease, or unspecified chronic kidney disease: Secondary | ICD-10-CM | POA: Diagnosis not present

## 2022-12-07 DIAGNOSIS — M6281 Muscle weakness (generalized): Secondary | ICD-10-CM | POA: Diagnosis not present

## 2022-12-07 DIAGNOSIS — S32512D Fracture of superior rim of left pubis, subsequent encounter for fracture with routine healing: Secondary | ICD-10-CM | POA: Diagnosis not present

## 2022-12-07 DIAGNOSIS — I509 Heart failure, unspecified: Secondary | ICD-10-CM | POA: Diagnosis not present

## 2022-12-07 DIAGNOSIS — J189 Pneumonia, unspecified organism: Secondary | ICD-10-CM | POA: Diagnosis not present

## 2022-12-07 DIAGNOSIS — D72829 Elevated white blood cell count, unspecified: Secondary | ICD-10-CM | POA: Diagnosis not present

## 2022-12-07 DIAGNOSIS — E785 Hyperlipidemia, unspecified: Secondary | ICD-10-CM | POA: Diagnosis not present

## 2022-12-07 DIAGNOSIS — R488 Other symbolic dysfunctions: Secondary | ICD-10-CM | POA: Diagnosis not present

## 2022-12-07 DIAGNOSIS — S32010D Wedge compression fracture of first lumbar vertebra, subsequent encounter for fracture with routine healing: Secondary | ICD-10-CM | POA: Diagnosis not present

## 2022-12-07 DIAGNOSIS — I700 Atherosclerosis of aorta: Secondary | ICD-10-CM | POA: Diagnosis not present

## 2022-12-07 DIAGNOSIS — F339 Major depressive disorder, recurrent, unspecified: Secondary | ICD-10-CM | POA: Diagnosis not present

## 2022-12-07 DIAGNOSIS — Z9181 History of falling: Secondary | ICD-10-CM | POA: Diagnosis not present

## 2022-12-07 DIAGNOSIS — M810 Age-related osteoporosis without current pathological fracture: Secondary | ICD-10-CM | POA: Diagnosis not present

## 2022-12-07 DIAGNOSIS — S32592D Other specified fracture of left pubis, subsequent encounter for fracture with routine healing: Secondary | ICD-10-CM | POA: Diagnosis not present

## 2022-12-07 DIAGNOSIS — F329 Major depressive disorder, single episode, unspecified: Secondary | ICD-10-CM | POA: Diagnosis not present

## 2022-12-07 NOTE — Discharge Summary (Signed)
[facility]  Physician Discharge Summary   Patient ID: Catherine West MRN: [id number] DOB/AGE: Sep 23, 1920 89 y.o.  Admit date: 12/02/2022 Discharge date:   12/07/2022   PCP: Benita Stabile, MD  DISCHARGE DIAGNOSES:    Essential (primary) hypertension   Major depressive disorder, single episode, unspecified   Compression fracture of L1 lumbar vertebra (HCC) Left pubic rami fracture  RECOMMENDATIONS FOR OUTPATIENT FOLLOW UP: Follow-up with Dr. Linna Caprice in 4 weeks Check CBC and basic metabolic panel in 1 week   Home Health: SNF Equipment/Devices: None  CODE STATUS: Full code  DISCHARGE CONDITION: fair  Diet recommendation: Regular as tolerated  INITIAL HISTORY: 89 y.o. female who presented to the ED from [facility] on 12/02/2022 after an unwitnessed fall. She was found to have compression fracture of L1 and left pelvis fracture. Orthopedics recommends WBAT with a walker and follow up as outpatient. She will require a higher level of care, and was admitted to pursue this and confirm adequate pain control.     HOSPITAL COURSE:   RLL opacity on CXR, leukocytosis/acute respiratory failure with hypoxia WBC improved.  Respiratory status is stable.  Still requiring 2 L of oxygen via nasal cannula. Patient was changed over to oral antibiotics. Seen by speech therapy. Hopefully oxygen can be weaned off once pneumonia has been treated.   Superior and inferior left pubic rami fractures Question of left acetabulum fracture not borne out in subsequent CT. Dr. Linna Caprice consulted > WBAT, 4wk f/u in office. No surgery.  Pain control.  Bowel regimen.   L1 compression fracture Dr. Conchita Paris consulted > no intervention necessary.    Chronic HFrEF/essential hypertension Looks like patient is no longer on losartan which will be discontinued.  Continue with amlodipine. Also looks like she is no longer on furosemide. Elevated blood pressure readings noted.  Will increase the dose  of amlodipine.   Chronic kidney disease stage IIIb Stable creatinine.  Monitor urine output.  Avoid nephrotoxic agents.   Macrocytic anemia/folic acid deficiency Drop in hemoglobin is likely dilutional.  No evidence of overt bleeding.  Hemoglobin is stable.  TSH is normal at 3.8.  Anemia panel does show folic acid deficiency and low normal vitamin B12 levels.  These will be supplemented.    Patient remains stable.  Okay for discharge once insurance has authorized short-term rehab and once SNF bed is available.     PERTINENT LABS:  The results of significant diagnostics from this hospitalization (including imaging, microbiology, ancillary and laboratory) are listed below for reference.      Labs:   Basic Metabolic Panel: Recent Labs  Lab 12/03/22 0010 12/04/22 0343 12/06/22 0415  NA 136 138 139  K 4.3 3.9 3.7  CL 100 102 105  CO2 26 28 28   GLUCOSE 156* 109* 89  BUN 25* 23 22  CREATININE 1.37* 1.31* 1.14*  CALCIUM 8.6* 8.4* 8.3*  MG  --  2.1 2.2   Liver Function Tests: Recent Labs  Lab 12/04/22 0343  AST 17  ALT 10  ALKPHOS 55  BILITOT 0.9  PROT 6.3*  ALBUMIN 3.0*    CBC: Recent Labs  Lab 12/03/22 0010 12/04/22 0343 12/06/22 0415  WBC 19.8* 9.6 10.6*  NEUTROABS 16.5* 6.8  --   HGB 12.4 10.6* 11.0*  HCT 39.1 33.6* 34.9*  MCV 101.3* 102.1* 102.0*  PLT PLATELET CLUMPS NOTED ON SMEAR, COUNT APPEARS ADEQUATE 151 162     IMAGING STUDIES CT Lumbar Spine Wo Contrast  Result Date: 12/03/2022 CLINICAL DATA:  Fall and trauma to the back. EXAM: CT LUMBAR SPINE WITHOUT CONTRAST TECHNIQUE: Multidetector CT imaging of the lumbar spine was performed without intravenous contrast administration. Multiplanar CT image reconstructions were also generated. RADIATION DOSE REDUCTION: This exam was performed according to the departmental dose-optimization program which includes automated exposure control, adjustment of the mA and/or kV according to patient size and/or use of  iterative reconstruction technique. COMPARISON:  CT abdomen pelvis dated 09/04/2022. FINDINGS: Segmentation: 5 lumbar type vertebrae. Alignment: No acute subluxation. Vertebrae: There is compression fracture of inferior endplate of L1, significantly progressed since the prior CT. There is near complete loss of vertebral body height centrally. This is concerning for an acute or subacute on chronic fracture. Correlation with clinical exam and point tenderness recommended. There is approximately 3 mm retropulsion of the posteroinferior endplate. Additionally there is linear lucency along the inferior edge of the left sacral ala (135/6 and 56/9) suspicious for an acute fracture. There is advanced osteopenia. Paraspinal and other soft tissues: No paraspinal fluid collection or hematoma. There is advanced atherosclerotic calcification of the visualized aorta. Disc levels: There is degenerative changes. Disc desiccation and vacuum phenomena at T12-L1 and L1-L2. IMPRESSION: 1. Progression of compression fracture of the inferior endplate of L1 since the prior CT of 09/04/2022. This likely represents an acute or subacute fracture. Correlation with point tenderness recommended. A 3 mm retropulsion of the posteroinferior endplate. 2. Suspected acute fracture of the left sacral ala. 3. Advanced osteopenia. 4.  Aortic Atherosclerosis (ICD10-I70.0). Electronically Signed   By: Elgie Collard M.D.   On: 12/03/2022 01:41   CT Hip Left Wo Contrast  Result Date: 12/03/2022 CLINICAL DATA:  Hip trauma fall EXAM: CT OF THE LEFT HIP WITHOUT CONTRAST TECHNIQUE: Multidetector CT imaging of the left hip was performed according to the standard protocol. Multiplanar CT image reconstructions were also generated. RADIATION DOSE REDUCTION: This exam was performed according to the departmental dose-optimization program which includes automated exposure control, adjustment of the mA and/or kV according to patient size and/or use of iterative  reconstruction technique. COMPARISON:  Radiograph 12/03/2022 FINDINGS: Bones/Joint/Cartilage Acute comminuted left superior and inferior pubic rami fractures with extension of lucency to the pubic symphysis but no symphyseal widening. No acetabular fracture. Proximal femur is intact. No sizable hip effusion Ligaments Suboptimally assessed by CT. Muscles and Tendons No intramuscular collections.  Mild atrophy. Soft tissues Skin thickening and subcutaneous edema at the left posterolateral hip most likely a contusion. No significant pelvic hematoma. IMPRESSION: Acute comminuted left superior and inferior pubic rami fractures with extension to the pubic symphysis but no symphyseal widening. No other discrete fractures are visualized. Electronically Signed   By: Jasmine Pang M.D.   On: 12/03/2022 01:34   CT HEAD WO CONTRAST ( )  Result Date: 12/03/2022 CLINICAL DATA:  Head trauma, minor (Age >= 65y); Neck trauma (Age >= 65y) EXAM: CT HEAD WITHOUT CONTRAST CT CERVICAL SPINE WITHOUT CONTRAST TECHNIQUE: Multidetector CT imaging of the head and cervical spine was performed following the standard protocol without intravenous contrast. Multiplanar CT image reconstructions of the cervical spine were also generated. RADIATION DOSE REDUCTION: This exam was performed according to the departmental dose-optimization program which includes automated exposure control, adjustment of the mA and/or kV according to patient size and/or use of iterative reconstruction technique. COMPARISON:  CT head 11/04/2019, CT head 02/22/2018 FINDINGS: CT HEAD FINDINGS Brain: Cerebral ventricle sizes are concordant with the degree of cerebral volume loss. Patchy and confluent areas of decreased attenuation are noted throughout the  deep and periventricular white matter of the cerebral hemispheres bilaterally, compatible with chronic microvascular ischemic disease. No evidence of large-territorial acute infarction. No parenchymal hemorrhage. No mass  lesion. No extra-axial collection. No mass effect or midline shift. No hydrocephalus. Basilar cisterns are patent. Vascular: No hyperdense vessel. Atherosclerotic calcifications are present within the cavernous internal carotid and vertebral arteries. Skull: No acute fracture or focal lesion. Sinuses/Orbits: Paranasal sinuses and mastoid air cells are clear. The orbits are unremarkable. Other: None. CT CERVICAL SPINE FINDINGS Alignment: Normal. Skull base and vertebrae: Multilevel moderate degenerative changes spine with associated severe osseous neural foraminal stenosis at the left C3-C4 level. No acute fracture. No aggressive appearing focal osseous lesion or focal pathologic process. Soft tissues and spinal canal: No prevertebral fluid or swelling. No visible canal hematoma. Upper chest: Unremarkable. Other: Atherosclerotic plaque of the carotid arteries within the neck. IMPRESSION: 1. No acute intracranial abnormality. 2. No acute displaced fracture or traumatic listhesis of the cervical spine. 3. Multilevel moderate degenerative changes spine with associated severe osseous neural foraminal stenosis at the left C3-C4 level. Electronically Signed   By: Tish Frederickson M.D.   On: 12/03/2022 01:17   CT CERVICAL SPINE WO CONTRAST  Result Date: 12/03/2022 CLINICAL DATA:  Head trauma, minor (Age >= 65y); Neck trauma (Age >= 65y) EXAM: CT HEAD WITHOUT CONTRAST CT CERVICAL SPINE WITHOUT CONTRAST TECHNIQUE: Multidetector CT imaging of the head and cervical spine was performed following the standard protocol without intravenous contrast. Multiplanar CT image reconstructions of the cervical spine were also generated. RADIATION DOSE REDUCTION: This exam was performed according to the departmental dose-optimization program which includes automated exposure control, adjustment of the mA and/or kV according to patient size and/or use of iterative reconstruction technique. COMPARISON:  CT head 11/04/2019, CT head 02/22/2018  FINDINGS: CT HEAD FINDINGS Brain: Cerebral ventricle sizes are concordant with the degree of cerebral volume loss. Patchy and confluent areas of decreased attenuation are noted throughout the deep and periventricular white matter of the cerebral hemispheres bilaterally, compatible with chronic microvascular ischemic disease. No evidence of large-territorial acute infarction. No parenchymal hemorrhage. No mass lesion. No extra-axial collection. No mass effect or midline shift. No hydrocephalus. Basilar cisterns are patent. Vascular: No hyperdense vessel. Atherosclerotic calcifications are present within the cavernous internal carotid and vertebral arteries. Skull: No acute fracture or focal lesion. Sinuses/Orbits: Paranasal sinuses and mastoid air cells are clear. The orbits are unremarkable. Other: None. CT CERVICAL SPINE FINDINGS Alignment: Normal. Skull base and vertebrae: Multilevel moderate degenerative changes spine with associated severe osseous neural foraminal stenosis at the left C3-C4 level. No acute fracture. No aggressive appearing focal osseous lesion or focal pathologic process. Soft tissues and spinal canal: No prevertebral fluid or swelling. No visible canal hematoma. Upper chest: Unremarkable. Other: Atherosclerotic plaque of the carotid arteries within the neck. IMPRESSION: 1. No acute intracranial abnormality. 2. No acute displaced fracture or traumatic listhesis of the cervical spine. 3. Multilevel moderate degenerative changes spine with associated severe osseous neural foraminal stenosis at the left C3-C4 level. Electronically Signed   By: Tish Frederickson M.D.   On: 12/03/2022 01:17   DG Hip Unilat W or Wo Pelvis 2-3 Views Left  Result Date: 12/03/2022 CLINICAL DATA:  Fall with left-sided hip pain EXAM: DG HIP (WITH OR WITHOUT PELVIS) 2-3V LEFT COMPARISON:  CT 09/04/2022 FINDINGS: SI joints are non widened. Pubic symphysis appears intact. Acute left superior and inferior pubic rami fractures  near the pubic symphysis. Question medial left acetabular fracture. No femoral  head dislocation. Vascular calcifications IMPRESSION: Acute left superior and inferior pubic rami fractures approaching the symphysis. Question medial left acetabular fracture. Recommend CT for further assessment Electronically Signed   By: Jasmine Pang M.D.   On: 12/03/2022 00:31   DG Chest 1 View  Result Date: 12/03/2022 CLINICAL DATA:  Fall with left-sided hip pain EXAM: CHEST  1 VIEW COMPARISON:  09/04/2022, CT 10/14/2013, 02/22/2018 FINDINGS: Mild cardiomegaly with aortic atherosclerosis. Possible patchy infiltrate in the right lower lung. Trace right pleural effusion or thickening. No pneumothorax IMPRESSION: Possible patchy infiltrate in the right lower lung. Trace right pleural effusion or thickening. Cardiomegaly Electronically Signed   By: Jasmine Pang M.D.   On: 12/03/2022 00:29    DISCHARGE EXAMINATION: Vitals:   12/06/22 0447 12/06/22 1409 12/06/22 2116 12/07/22 0435  BP: (!) 161/63 (!) 157/53 (!) 175/55 (!) 162/60  Pulse: (!) 54 (!) 53 60 63  Resp: 18 17 18 18   Temp: 98 F (36.7 C) 98 F (36.7 C) 99.1 F (37.3 C) 98.6 F (37 C)  TempSrc:   Oral Oral  SpO2: 97% 92% 93% 97%  Weight:      Height:       General appearance: Awake alert.  In no distress Resp: Clear to auscultation bilaterally.  Normal effort Cardio: S1-S2 is normal regular.  No S3-S4.  No rubs murmurs or bruit GI: Abdomen is soft.  Nontender nondistended.  Bowel sounds are present normal.  No masses organomegaly Restricted range of motion of the left lower extremity due to pelvic fracture    DISPOSITION: SNF  Discharge Instructions     Call MD for:  difficulty breathing, headache or visual disturbances   Complete by: As directed    Call MD for:  extreme fatigue   Complete by: As directed    Call MD for:  persistant dizziness or light-headedness   Complete by: As directed    Call MD for:  persistant nausea and vomiting    Complete by: As directed    Call MD for:  severe uncontrolled pain   Complete by: As directed    Call MD for:  temperature >100.4   Complete by: As directed    Diet - low sodium heart healthy   Complete by: As directed    Discharge instructions   Complete by: As directed    Please review instructions on the discharge summary.  You were cared for by a hospitalist during your hospital stay. If you have any questions about your discharge medications or the care you received while you were in the hospital after you are discharged, you can call the unit and asked to speak with the hospitalist on call if the hospitalist that took care of you is not available. Once you are discharged, your primary care physician will handle any further medical issues. Please note that NO REFILLS for any discharge medications will be authorized once you are discharged, as it is imperative that you return to your primary care physician (or establish a relationship with a primary care physician if you do not have one) for your aftercare needs so that they can reassess your need for medications and monitor your lab values. If you do not have a primary care physician, you can call [phone] for a physician referral.   Increase activity slowly   Complete by: As directed          Allergies as of 12/07/2022   No Known Allergies      Medication List  STOP taking these medications    furosemide 20 MG tablet Commonly known as: LASIX   HYDROcodone-acetaminophen 5-325 MG tablet Commonly known as: NORCO/VICODIN   losartan 100 MG tablet Commonly known as: COZAAR   Voltaren 1 % Gel Generic drug: diclofenac Sodium       TAKE these medications    acetaminophen 325 MG tablet Commonly known as: TYLENOL Take 2 tablets (650 mg total) by mouth 3 (three) times daily for 3 days.   amLODipine 10 MG tablet Commonly known as: NORVASC Take 1 tablet (10 mg total) by mouth daily. What changed:  medication  strength how much to take   camphor-menthol lotion Commonly known as: SARNA Apply topically as needed for itching.   cefdinir 300 MG capsule Commonly known as: OMNICEF Take 1 capsule (300 mg total) by mouth daily.   cyanocobalamin 500 MCG tablet Commonly known as: VITAMIN B12 Take 1 tablet (500 mcg total) by mouth daily.   famotidine 20 MG tablet Commonly known as: PEPCID Take 1 tablet (20 mg total) by mouth daily. What changed:  medication strength how much to take   folic acid 1 MG tablet Commonly known as: FOLVITE Take 1 tablet (1 mg total) by mouth daily.   loperamide 2 MG tablet Commonly known as: IMODIUM A-D Take 2 mg by mouth every 6 (six) hours as needed for diarrhea or loose stools.   oxyCODONE 5 MG immediate release tablet Commonly known as: Oxy IR/ROXICODONE Take 1 tablet (5 mg total) by mouth every 6 (six) hours as needed for severe pain.   polyethylene glycol 17 g packet Commonly known as: MIRALAX / GLYCOLAX Take 17 g by mouth daily.   senna-docusate 8.6-50 MG tablet Commonly known as: Senokot-S Take 2 tablets by mouth 2 (two) times daily.   sertraline 100 MG tablet Commonly known as: ZOLOFT Take 100 mg by mouth daily.   Vitamin D 50 MCG (2000 UT) Caps Take 2,000 Units by mouth daily.          Contact information for follow-up providers     Benita Stabile, MD. Schedule an appointment as soon as possible for a visit in 1 week(s).   Specialty: Internal Medicine Why: post hospitalization follow up Contact information: [address] [place] [facility] [address] [phone]              Contact information for after-discharge care     Destination     [facility]erred SNF .   Service: Skilled Nursing Contact information: [address] [place] [address] [phone]                     TOTAL DISCHARGE TIME: 35 minutes   Rito Ehrlich  [facility] Pager on  [email or web address]  12/07/2022, 9:01 AM

## 2022-12-07 NOTE — Care Management Important Message (Signed)
Important Message  Patient Details  Name: Catherine West MRN: [id number] Date of Birth: 02-Oct-1920   Medicare Important Message Given:  Yes     Corey Harold 12/07/2022, 10:07 AM

## 2022-12-07 NOTE — TOC Transition Note (Signed)
Transition of Care [facility]) - CM/SW Discharge Note   Patient Details  Name: Catherine West MRN: [id number] Date of Birth: [birth year]  [facility] CM/SW Contact:  Elliot Gault, LCSW Phone Number: 12/07/2022, 10:27 AM   Clinical Narrative:     TOC following. Pt has insurance auth for SNF rehab. Updated Tami at [facility] and they can admit pt today.  Updated pt's daughter/POA, Olegario Messier, and she remains in agreement with the dc plan.   DC clinical sent electronically. RN to call report. No other TOC needs for dc.  Final next level of care: Skilled Nursing Facility Barriers to Discharge: Barriers Resolved   Patient Goals and CMS Choice CMS Medicare.gov Compare Post Acute Care list provided to:: Patient Represenative (must comment) Choice offered to / list presented to : [facility] POA / Guardian  Discharge Placement                Patient chooses bed at: [facility] Patient to be transferred to facility by: w/c Name of family member notified: Idelle Crouch Patient and family notified of of transfer: 12/07/22  Discharge Plan and Services Additional resources added to the After Visit Summary for   In-house Referral: Clinical Social Work   Post Acute Care Choice: Skilled Nursing Facility                               Social Determinants of Health (SDOH) Interventions SDOH Screenings   Food Insecurity: Patient Declined (12/06/2022)  Housing: Low Risk  (12/06/2022)  Transportation Needs: No Transportation Needs (12/06/2022)  Utilities: Not At Risk (12/06/2022)  Tobacco Use: Low Risk  (12/02/2022)     Readmission Risk Interventions     No data to display

## 2022-12-08 ENCOUNTER — Non-Acute Institutional Stay (SKILLED_NURSING_FACILITY): Payer: Medicare Other | Admitting: Adult Health

## 2022-12-08 ENCOUNTER — Encounter: Payer: Self-pay | Admitting: Adult Health

## 2022-12-08 DIAGNOSIS — K5909 Other constipation: Secondary | ICD-10-CM

## 2022-12-08 DIAGNOSIS — I130 Hypertensive heart and chronic kidney disease with heart failure and stage 1 through stage 4 chronic kidney disease, or unspecified chronic kidney disease: Secondary | ICD-10-CM | POA: Diagnosis not present

## 2022-12-08 DIAGNOSIS — E039 Hypothyroidism, unspecified: Secondary | ICD-10-CM

## 2022-12-08 DIAGNOSIS — N1832 Chronic kidney disease, stage 3b: Secondary | ICD-10-CM | POA: Diagnosis not present

## 2022-12-08 DIAGNOSIS — E538 Deficiency of other specified B group vitamins: Secondary | ICD-10-CM | POA: Insufficient documentation

## 2022-12-08 DIAGNOSIS — K219 Gastro-esophageal reflux disease without esophagitis: Secondary | ICD-10-CM

## 2022-12-08 DIAGNOSIS — S32592S Other specified fracture of left pubis, sequela: Secondary | ICD-10-CM | POA: Diagnosis not present

## 2022-12-08 DIAGNOSIS — S32592D Other specified fracture of left pubis, subsequent encounter for fracture with routine healing: Secondary | ICD-10-CM

## 2022-12-08 DIAGNOSIS — S32010D Wedge compression fracture of first lumbar vertebra, subsequent encounter for fracture with routine healing: Secondary | ICD-10-CM | POA: Diagnosis not present

## 2022-12-08 DIAGNOSIS — F329 Major depressive disorder, single episode, unspecified: Secondary | ICD-10-CM

## 2022-12-08 DIAGNOSIS — I129 Hypertensive chronic kidney disease with stage 1 through stage 4 chronic kidney disease, or unspecified chronic kidney disease: Secondary | ICD-10-CM

## 2022-12-08 DIAGNOSIS — I700 Atherosclerosis of aorta: Secondary | ICD-10-CM

## 2022-12-08 DIAGNOSIS — J189 Pneumonia, unspecified organism: Secondary | ICD-10-CM

## 2022-12-08 DIAGNOSIS — I10 Essential (primary) hypertension: Secondary | ICD-10-CM | POA: Diagnosis not present

## 2022-12-08 DIAGNOSIS — I5022 Chronic systolic (congestive) heart failure: Secondary | ICD-10-CM

## 2022-12-08 DIAGNOSIS — S32010S Wedge compression fracture of first lumbar vertebra, sequela: Secondary | ICD-10-CM | POA: Diagnosis not present

## 2022-12-08 DIAGNOSIS — D539 Nutritional anemia, unspecified: Secondary | ICD-10-CM

## 2022-12-08 DIAGNOSIS — I5023 Acute on chronic systolic (congestive) heart failure: Secondary | ICD-10-CM | POA: Insufficient documentation

## 2022-12-08 NOTE — Progress Notes (Signed)
Location:  [facility] Room Number: 151 P Place of Service:  SNF (31)   CODE STATUS: DNR  No Known Allergies  Chief Complaint  Patient presents with   Hospitalization Follow-up    Follow-up from recent hospital stay from 12/02/22-12/07/22    HPI:  She is a 89 year old woman who has been hospitalized from 12-02-22 through 12-07-22. Her medical history includes: hypertension; congestive heart failure major depressive disorder. She presented to the ED from assisted living [place] on 12-02-22 after sustaining an unwitnessed fall. She suffered a L1 compression fracture and a left pelvic fracture. No surgical intervention was required. She is weight bearing as tolerated. She will need to use a walker. Her goal is to return back to her assisted living. When asked where she lived she stated "here".  She was found to have right lower lobe opacity by chest x-ray with acute respiratory failure with hypoxia. She did have leukocytosis. She was on 02 which has been stopped; she will need 2 more days of oral abt.  She is here for short term rehab. She will continue to be followed for her chronic illnesses including:   Benign hypertension with stage 3b chronic kidney disease:    Acquired hypothyroidism:  Major depressive disorder single episode remission status unspecified:    Chronic constipation:     Past Medical History:  Diagnosis Date   Acute bronchitis    Acute bronchitis    Chronic kidney disease, stage 2 (mild)    Chronic kidney disease, unspecified    Cough    Essential (primary) hypertension    HTN (hypertension)    Hyperlipidemia, unspecified    Hypertensive chronic kidney disease with stage 1 through stage 4 chronic kidney disease, or unspecified chronic kidney disease    Hypothyroidism, unspecified    Major depressive disorder, single episode, unspecified    Osteoporosis    Osteoporosis    Pneumonia    Pneumonia    Urinary tract infection    Urinary tract  infection     Past Surgical History:  Procedure Laterality Date   LESION REMOVAL Left 11/10/2022   Procedure: LESION REMOVAL TRUNK;  Surgeon: [name], MD;  Location: [facility];  Service: General;  Laterality: Left;  left lower trunk    Social History   Socioeconomic History   Marital status: Widowed    Spouse name: Not on file   Number of children: 3   Years of education: 11   Highest education level: High school graduate  Occupational History   Not on file  Tobacco Use   Smoking status: Never   Smokeless tobacco: Never  Vaping Use   Vaping status: Never Used  Substance and Sexual Activity   Alcohol use: No   Drug use: No   Sexual activity: Not on file  Other Topics Concern   Not on file  Social History Narrative   Lives alone, daughter & son provide support and transportation   Social Determinants of Health   Financial Resource Strain: Not on file  Food Insecurity: Patient Declined (12/06/2022)   Hunger Vital Sign    Worried About Running Out of Food in the Last Year: Patient declined    Ran Out of Food in the Last Year: Patient declined  Transportation Needs: No Transportation Needs (12/06/2022)   PRAPARE - Administrator, Civil Service (Medical): No    Lack of Transportation (Non-Medical): No  Physical Activity: Not on file  Stress: Not on file  Social Connections: Not on file  Intimate Partner Violence: Not At Risk (12/06/2022)   Humiliation, Afraid, Rape, and Kick questionnaire    Fear of Current or Ex-Partner: No    Emotionally Abused: No    Physically Abused: No    Sexually Abused: No   Family History  Problem Relation Age of Onset   Other Father        brain tumor, inoperable   Emphysema Sister        smoker   Stroke Neg Hx    Parkinson's disease Neg Hx    Dementia Neg Hx       VITAL SIGNS BP (!) 156/72   Pulse 62   Temp (!) 97.4 F (36.3 C)   Ht 5\' 2"  (1.575 m)   Wt 154 lb (69.9 kg) Comment: Recorded from [facility] EMR system  Matrix  SpO2 97%   BMI 28.17 kg/m   Outpatient Encounter Medications as of 12/08/2022  Medication Sig   acetaminophen (TYLENOL) 325 MG tablet Take 2 tablets (650 mg total) by mouth 3 (three) times daily for 3 days.   amLODipine (NORVASC) 10 MG tablet Take 1 tablet (10 mg total) by mouth daily.   camphor-menthol (SARNA) lotion Apply topically as needed for itching.   cefdinir (OMNICEF) 300 MG capsule Take 1 capsule (300 mg total) by mouth daily.   Cholecalciferol (VITAMIN D) 50 MCG (2000 UT) CAPS Take 2,000 Units by mouth daily.   famotidine (PEPCID) 20 MG tablet Take 1 tablet (20 mg total) by mouth daily.   folic acid (FOLVITE) 1 MG tablet Take 1 tablet (1 mg total) by mouth daily.   loperamide (IMODIUM A-D) 2 MG tablet Take 2 mg by mouth every 6 (six) hours as needed for diarrhea or loose stools.   oxyCODONE (OXY IR/ROXICODONE) 5 MG immediate release tablet Take 1 tablet (5 mg total) by mouth every 6 (six) hours as needed for severe pain.   OXYGEN Inhale 2 L into the lungs as directed. Every Shift; Day, Evening, Night   polyethylene glycol (MIRALAX / GLYCOLAX) 17 g packet Take 17 g by mouth daily.   senna-docusate (SENOKOT-S) 8.6-50 MG tablet Take 2 tablets by mouth 2 (two) times daily.   sertraline (ZOLOFT) 100 MG tablet Take 100 mg by mouth daily.   vitamin B-12 (VITAMIN B12) 500 MCG tablet Take 1 tablet (500 mcg total) by mouth daily.   No facility-administered encounter medications on file as of 12/08/2022.     SIGNIFICANT DIAGNOSTIC EXAMS  LABS REVIEWED:   12-03-22: wbc 19.8; hgb 12.4; hct 39.1; mcv 101.3 plt cl; glucose 156; bun 25; creat 1.37; k+ 4.3; na++ 136; ca 8.6; gfr 34 tsh3.810 12-06-22: wbc 10.6; hgb 11.0; hct 34.9; mcv 102.0 plt 162; glucose 98; bun 22; creat 1.14; k+ 3.7; na++ 139; ca 8.3; gfr 43; vitamin B12: 332; folate 5.5; iron 46; tibc 221; ferritin 292; mag 2.2    Review of Systems  Constitutional:  Negative for malaise/fatigue.  Respiratory:  Negative for cough  and shortness of breath.   Cardiovascular:  Negative for chest pain, palpitations and leg swelling.  Gastrointestinal:  Negative for abdominal pain, constipation and heartburn.  Musculoskeletal:  Negative for back pain, joint pain and myalgias.  Skin: Negative.   Neurological:  Negative for dizziness.  Psychiatric/Behavioral:  The patient is not nervous/anxious.    Physical Exam Constitutional:      General: She is not in acute distress.    Appearance: She is well-developed. She is not  diaphoretic.  Neck:     Thyroid: No thyromegaly.  Cardiovascular:     Rate and Rhythm: Normal rate and regular rhythm.     Pulses: Normal pulses.     Heart sounds: Normal heart sounds.  Pulmonary:     Effort: Pulmonary effort is normal. No respiratory distress.     Breath sounds: Normal breath sounds.  Abdominal:     General: Bowel sounds are normal. There is no distension.     Palpations: Abdomen is soft.     Tenderness: There is no abdominal tenderness.  Musculoskeletal:        General: Normal range of motion.     Cervical back: Neck supple.     Right lower leg: No edema.     Left lower leg: No edema.     Comments: Has some bilateral lower extremity weakness present   Lymphadenopathy:     Cervical: No cervical adenopathy.  Skin:    General: Skin is warm and dry.     Comments: Bruising present.   Neurological:     Mental Status: She is alert. Mental status is at baseline.  Psychiatric:        Mood and Affect: Mood normal.       ASSESSMENT/ PLAN:  TODAY  Compression fracture L1 vertebrae sequela /  pubic ramus fracture left sequela:  will continue therapy as directed to improve upon her level of independence with her adls. Will follow up with orthopedics. Will continue tylenol 650 mg three times daily; and oxycodone 5 mg every 6 hours as needed through 12-13-22.   2. Benign hypertension with stage 3b chronic kidney disease: b/p 156/72 will continue norvasc 10 mg daily   3.  Acquired  hypothyroidism: tsh 3.810  4. Major depressive disorder single episode remission status unspecified: will continue zoloft 100 mg daily   5. Chronic constipation: will continue miralax daily and senna s 2 tabs twice daily   6. Macrocytic anemia: hgb 11.0/mcv 102.2  7.  Folate deficiency/vitamin B 12 deficiency:  will continue folate 1 mg and vitamin b 12 500 mcg daily   8. GERD without esophagitis: will continue pepcid 20 mg daily   9. Aortic atherosclerosis (ct 12-03-22) is not on statin due to age  56. Chronic HFrEF  (heart failure with reduced ejection fracture) EF 40-45% is not on diuretic  11. Stage 3b chronic kidney disease: bun 22; creat 1.14; gfr 43      Synthia Innocent NP [facility]  call [phone]

## 2022-12-09 ENCOUNTER — Non-Acute Institutional Stay (SKILLED_NURSING_FACILITY): Payer: Medicare Other | Admitting: Internal Medicine

## 2022-12-09 ENCOUNTER — Encounter: Payer: Self-pay | Admitting: Internal Medicine

## 2022-12-09 DIAGNOSIS — S32010D Wedge compression fracture of first lumbar vertebra, subsequent encounter for fracture with routine healing: Secondary | ICD-10-CM | POA: Diagnosis not present

## 2022-12-09 DIAGNOSIS — S32592D Other specified fracture of left pubis, subsequent encounter for fracture with routine healing: Secondary | ICD-10-CM

## 2022-12-09 DIAGNOSIS — N1832 Chronic kidney disease, stage 3b: Secondary | ICD-10-CM

## 2022-12-09 DIAGNOSIS — S32592S Other specified fracture of left pubis, sequela: Secondary | ICD-10-CM

## 2022-12-09 DIAGNOSIS — S32010S Wedge compression fracture of first lumbar vertebra, sequela: Secondary | ICD-10-CM | POA: Diagnosis not present

## 2022-12-09 DIAGNOSIS — I10 Essential (primary) hypertension: Secondary | ICD-10-CM | POA: Diagnosis not present

## 2022-12-09 DIAGNOSIS — J189 Pneumonia, unspecified organism: Secondary | ICD-10-CM

## 2022-12-09 DIAGNOSIS — D539 Nutritional anemia, unspecified: Secondary | ICD-10-CM

## 2022-12-09 NOTE — Assessment & Plan Note (Signed)
WBAT with walker.  PT/OT at SNF as tolerated.

## 2022-12-09 NOTE — Patient Instructions (Signed)
See assessment and plan under each diagnosis in the problem list and acutely for this visit 

## 2022-12-09 NOTE — Assessment & Plan Note (Signed)
PT/OT at SNF as tolerated.  Orthopedic follow-up as scheduled. 

## 2022-12-09 NOTE — Assessment & Plan Note (Addendum)
Losartan 100 mg daily was D/Ced  &Amlodipine was increased to 10 mg daily as inpatient.  Peak blood pressure documented was 175/55. Losartan 50 mg every day will be initiated.

## 2022-12-09 NOTE — Assessment & Plan Note (Addendum)
With rehydration final creatinine 1.14 with a GFR of 43 indicating high stage IIIb CKD.  Med list reviewed; no indication for change in meds or dosages.ARB will be restarted for nephroprotection.

## 2022-12-09 NOTE — Assessment & Plan Note (Addendum)
She is unaware that she had pneumonia and denies any active pulmonary symptoms.  On exam she does have minor rales in the right lower lobe w/o respiratory compromise or desaturation.  Continue to monitor O2 sats.

## 2022-12-09 NOTE — Assessment & Plan Note (Addendum)
MCV 101.3.  B12 level low normal; 500 mcg daily initiated.  Folate 5 5; 1 mg folate prescribed. She was in ALF & nutritional status may have been suboptimal. Nutritionist to follow @ SNF.

## 2022-12-09 NOTE — Progress Notes (Signed)
NURSING HOME LOCATION:  [facility] ROOM NUMBER:  151 P  CODE STATUS:  DNR  PCP:  Benita Stabile MD  This is a comprehensive admission note to this SNFperformed on this date less than 30 days from date of admission. Included are preadmission medical/surgical history; reconciled medication list; family history; social history and comprehensive review of systems.  Corrections and additions to the records were documented. Comprehensive physical exam was also performed. Additionally a clinical summary was entered for each active diagnosis pertinent to this admission in the Problem List to enhance continuity of care.  HPI: She was hospitalized 8/1 - 12/07/2022 presenting to the ED from [facility] after an unwitnessed fall.  Compression fracture at L1 and left pelvis fracture were documented.  Orthopedist Dr. Linna Caprice recommended WBAT with a walker with follow-up as an outpatient. An incidental finding was an ill-defined right lower lobe infiltrate associated with leukocytosis and acute respiratory insufficiency with hypoxia.  Submental oxygen at 2 L was prescribed with subsequent wean. White blood count peaked at 19,800 with significant left shift..  Procalcitonin was normal at less than 0.10.  Parenteral antibiotics were transitioned to cefdinir 300 mg daily.  With antibiotic therapy there was significant improvement in the white count with final white count of 10,600.   Initial H/H was 12.4/39.1 with macrocytosis; MCV was 101.3.  The macrocytosis persisted and she did develop mild anemia with final H/H of 11/34.9.  This was attributed to dilutional factors.  B12 level was low normal; vitamin B12 500 mcg daily was initiated.  Folate was 5.5; folic acid 1 mg daily was prescribed.  Initial creatinine was 1.37 which appears to be her baseline.  GFR was 34.  With hydration final creatinine was 1.14 and GFR 43.  Troponin was minimally elevated at 20.  During hospitalization glucoses ranged  from final value of 89 , down from initial value of 156.  TSH was therapeutic. Because of elevated blood pressures, amlodipine dose was increased.  Peak recorded blood pressure was 175/55. PT/OT consulted and recommended placement of SNF for rehab.  Past medical and surgical history: Includes history of essential hypertension, dyslipidemia, hypothyroidism, osteoporosis, history of congestive heart failure, and CKD. Surgeries and procedures include removal of basal cell carcinoma from my abdomen 11/10/2022.  Social history: Non-smoker, nondrinker.  Family history: Noncontributory due to advanced age.   Review of systems: Although she gave me the correct date ;when I asked why she had been hospitalized she focused on the excision of the basal cell carcinoma from the abdomen, not the fractures.  It is her impression that the basal cell was "a little placed taken out of my side, evidently nothing."  She denied being told that she had pneumonia.  She did validate that she had "fractured my back end."  She denies any cardiopulmonary or neurologic prodrome stating "I sat down on the floor."  Her only complaint is pain with range of motion of the LLE.  Constitutional: No fever, significant weight change, fatigue  Eyes: No redness, discharge, pain, vision change ENT/mouth: No nasal congestion, purulent discharge, earache, change in hearing, sore throat  Cardiovascular: No chest pain, palpitations, paroxysmal nocturnal dyspnea, claudication, edema  Respiratory: No cough, sputum production, hemoptysis, DOE, significant snoring, apnea Gastrointestinal: No heartburn, dysphagia, abdominal pain, nausea /vomiting, rectal bleeding, melena, change in bowels Genitourinary: No dysuria, hematuria, pyuria, incontinence, nocturia Is Dermatologic: No rash, pruritus, change in appearance of skin Neurologic: No dizziness, headache, syncope, seizures, numbness, tingling Psychiatric: No  significant anxiety, depression,  insomnia, anorexia Endocrine: No change in hair/skin/nails, excessive thirst, excessive hunger, excessive urination  Hematologic/lymphatic: No significant bruising, lymphadenopathy, abnormal bleeding Allergy/immunology: No itchy/watery eyes, significant sneezing, urticaria, angioedema  Physical exam:  Pertinent or positive findings: She appears her stated age.  Eyebrows are absent.  She has bilateral ptosis.  The left lacrimal gland is prominent.  She has a faint right carotid bruit.  There is a brisk grade 1/2 systolic murmur at the base.  Second heart sound is increased.  She has minor rales in the right lower lobe.  Pedal pulses are decreased.  Slight clubbing of the nailbeds is suggested.  She has trace edema at the left sock line.  Interosseous wasting is present.  Scarring is present over the right shin.  There is ecchymosis of the left lower quadrant.  She also has scattered ecchymoses over the forearms, greater on the right.  General appearance: Adequately nourished; no acute distress, increased work of breathing is present.   Lymphatic: No lymphadenopathy about the head, neck, axilla. Eyes: No conjunctival inflammation or lid edema is present. There is no scleral icterus. Ears:  External ear exam shows no significant lesions or deformities.   Nose:  External nasal examination shows no deformity or inflammation. Nasal mucosa are pink and moist without lesions, exudates Oral exam: Lips and gums are healthy appearing.There is no oropharyngeal erythema or exudate. Neck:  No thyromegaly, masses, tenderness noted.    Heart:  Normal rate and regular rhythm. S1 normal without gallop, click, rub.  Lungs: without wheezes, rhonchi, rubs. Abdomen: Bowel sounds are normal.  Abdomen is soft and nontender with no organomegaly, hernias, masses. GU: Deferred  Extremities:  No cyanosis Neurologic exam:Balance, Rhomberg, finger to nose testing could not be completed due to clinical state Skin: Warm & dry  w/o tenting. No significant rash.  See clinical summary under each active problem in the Problem List with associated updated therapeutic plan

## 2022-12-13 ENCOUNTER — Other Ambulatory Visit (HOSPITAL_COMMUNITY)
Admission: RE | Admit: 2022-12-13 | Discharge: 2022-12-13 | Disposition: A | Discharge status: Home / Self Care | Payer: Medicare Other | Source: Skilled Nursing Facility | Attending: Adult Health | Admitting: Adult Health

## 2022-12-13 DIAGNOSIS — I130 Hypertensive heart and chronic kidney disease with heart failure and stage 1 through stage 4 chronic kidney disease, or unspecified chronic kidney disease: Secondary | ICD-10-CM | POA: Insufficient documentation

## 2022-12-13 LAB — CBC
HCT: 37.1 % (ref 36.0–46.0)
Hemoglobin: 12.1 g/dL (ref 12.0–15.0)
MCH: 32.7 pg (ref 26.0–34.0)
MCHC: 32.6 g/dL (ref 30.0–36.0)
MCV: 100.3 fL — ABNORMAL HIGH (ref 80.0–100.0)
Platelets: 181 10*3/uL (ref 150–400)
RBC: 3.7 MIL/uL — ABNORMAL LOW (ref 3.87–5.11)
RDW: 14.6 % (ref 11.5–15.5)
WBC: 9.2 10*3/uL (ref 4.0–10.5)
nRBC: 0 % (ref 0.0–0.2)

## 2022-12-13 LAB — BASIC METABOLIC PANEL
Anion gap: 8 (ref 5–15)
BUN: 21 mg/dL (ref 8–23)
CO2: 29 mmol/L (ref 22–32)
Calcium: 9 mg/dL (ref 8.9–10.3)
Chloride: 99 mmol/L (ref 98–111)
Creatinine, Ser: 1.09 mg/dL — ABNORMAL HIGH (ref 0.44–1.00)
GFR, Estimated: 45 mL/min — ABNORMAL LOW (ref >60)
Glucose, Bld: 101 mg/dL — ABNORMAL HIGH (ref 70–99)
Potassium: 3.9 mmol/L (ref 3.5–5.1)
Sodium: 136 mmol/L (ref 135–145)

## 2022-12-23 ENCOUNTER — Non-Acute Institutional Stay (SKILLED_NURSING_FACILITY): Payer: Medicare Other | Admitting: Adult Health

## 2022-12-23 ENCOUNTER — Encounter: Payer: Self-pay | Admitting: Adult Health

## 2022-12-23 DIAGNOSIS — I700 Atherosclerosis of aorta: Secondary | ICD-10-CM

## 2022-12-23 DIAGNOSIS — S32010S Wedge compression fracture of first lumbar vertebra, sequela: Secondary | ICD-10-CM | POA: Diagnosis not present

## 2022-12-23 DIAGNOSIS — J189 Pneumonia, unspecified organism: Secondary | ICD-10-CM

## 2022-12-23 DIAGNOSIS — N1832 Chronic kidney disease, stage 3b: Secondary | ICD-10-CM | POA: Diagnosis not present

## 2022-12-23 DIAGNOSIS — S32592S Other specified fracture of left pubis, sequela: Secondary | ICD-10-CM

## 2022-12-23 DIAGNOSIS — S32592D Other specified fracture of left pubis, subsequent encounter for fracture with routine healing: Secondary | ICD-10-CM

## 2022-12-23 DIAGNOSIS — I129 Hypertensive chronic kidney disease with stage 1 through stage 4 chronic kidney disease, or unspecified chronic kidney disease: Secondary | ICD-10-CM

## 2022-12-23 DIAGNOSIS — D539 Nutritional anemia, unspecified: Secondary | ICD-10-CM

## 2022-12-23 DIAGNOSIS — S32010D Wedge compression fracture of first lumbar vertebra, subsequent encounter for fracture with routine healing: Secondary | ICD-10-CM | POA: Diagnosis not present

## 2022-12-23 NOTE — Progress Notes (Signed)
Location:  [facility] Room Number: 151 Place of Service:  SNF (31)   CODE STATUS: dnr   No Known Allergies  Chief Complaint  Patient presents with   Discharge Note    HPI:  She is being discharged to assisted living with home health for pt/ot. She does not need any dme; the facility will provide her medications and her medical follow up. She had been hospitalized status post fall with L1 compression fracture and pelvic fracture. She has participated in pt/ot to improve upon her independence with her adls and is ready to return back to assisted living.   Past Medical History:  Diagnosis Date   Acute bronchitis    Acute bronchitis    Chronic kidney disease, stage 2 (mild)    Chronic kidney disease, unspecified    Cough    Essential (primary) hypertension    HTN (hypertension)    Hyperlipidemia, unspecified    Hypertensive chronic kidney disease with stage 1 through stage 4 chronic kidney disease, or unspecified chronic kidney disease    Hypothyroidism, unspecified    Major depressive disorder, single episode, unspecified    Osteoporosis    Osteoporosis    Pneumonia    Pneumonia    Urinary tract infection    Urinary tract infection     Past Surgical History:  Procedure Laterality Date   LESION REMOVAL Left 11/10/2022   Procedure: LESION REMOVAL TRUNK;  Surgeon: [name], MD;  Location: [facility];  Service: General;  Laterality: Left;  left lower trunk    Social History   Socioeconomic History   Marital status: Widowed    Spouse name: Not on file   Number of children: 3   Years of education: 11   Highest education level: High school graduate  Occupational History   Not on file  Tobacco Use   Smoking status: Never   Smokeless tobacco: Never  Vaping Use   Vaping status: Never Used  Substance and Sexual Activity   Alcohol use: No   Drug use: No   Sexual activity: Not on file  Other Topics Concern   Not on file  Social History  Narrative   Lives alone, daughter & son provide support and transportation   Social Determinants of Health   Financial Resource Strain: Not on file  Food Insecurity: Patient Declined (12/06/2022)   Hunger Vital Sign    Worried About Running Out of Food in the Last Year: Patient declined    Ran Out of Food in the Last Year: Patient declined  Transportation Needs: No Transportation Needs (12/06/2022)   PRAPARE - Administrator, Civil Service (Medical): No    Lack of Transportation (Non-Medical): No  Physical Activity: Not on file  Stress: Not on file  Social Connections: Not on file  Intimate Partner Violence: Not At Risk (12/06/2022)   Humiliation, Afraid, Rape, and Kick questionnaire    Fear of Current or Ex-Partner: No    Emotionally Abused: No    Physically Abused: No    Sexually Abused: No   Family History  Problem Relation Age of Onset   Other Father        brain tumor, inoperable   Emphysema Sister        smoker   Stroke Neg Hx    Parkinson's disease Neg Hx    Dementia Neg Hx       VITAL SIGNS BP 135/68   Pulse 66   Temp 97.8 F (36.6  C)   Resp 16   Ht 5\' 2"  (1.575 m)   Wt 129 lb 6.4 oz (58.7 kg)   SpO2 94%   BMI 23.67 kg/m   Outpatient Encounter Medications as of 12/23/2022  Medication Sig   amLODipine (NORVASC) 10 MG tablet Take 1 tablet (10 mg total) by mouth daily.   camphor-menthol (SARNA) lotion Apply topically as needed for itching.   cefdinir (OMNICEF) 300 MG capsule Take 1 capsule (300 mg total) by mouth daily.   Cholecalciferol (VITAMIN D) 50 MCG (2000 UT) CAPS Take 2,000 Units by mouth daily.   famotidine (PEPCID) 20 MG tablet Take 1 tablet (20 mg total) by mouth daily.   folic acid (FOLVITE) 1 MG tablet Take 1 tablet (1 mg total) by mouth daily.   loperamide (IMODIUM A-D) 2 MG tablet Take 2 mg by mouth every 6 (six) hours as needed for diarrhea or loose stools.   oxyCODONE (OXY IR/ROXICODONE) 5 MG immediate release tablet Take 1 tablet (5  mg total) by mouth every 6 (six) hours as needed for severe pain.   OXYGEN Inhale 2 L into the lungs as directed. Every Shift; Day, Evening, Night   polyethylene glycol (MIRALAX / GLYCOLAX) 17 g packet Take 17 g by mouth daily.   senna-docusate (SENOKOT-S) 8.6-50 MG tablet Take 2 tablets by mouth 2 (two) times daily.   sertraline (ZOLOFT) 100 MG tablet Take 100 mg by mouth daily.   vitamin B-12 (VITAMIN B12) 500 MCG tablet Take 1 tablet (500 mcg total) by mouth daily.   No facility-administered encounter medications on file as of 12/23/2022.     SIGNIFICANT DIAGNOSTIC EXAMS  LABS REVIEWED:   12-03-22: wbc 19.8; hgb 12.4; hct 39.1; mcv 101.3 plt cl; glucose 156; bun 25; creat 1.37; k+ 4.3; na++ 136; ca 8.6; gfr 34 tsh3.810 12-06-22: wbc 10.6; hgb 11.0; hct 34.9; mcv 102.0 plt 162; glucose 98; bun 22; creat 1.14; k+ 3.7; na++ 139; ca 8.3; gfr 43; vitamin B12: 332; folate 5.5; iron 46; tibc 221; ferritin 292; mag 2.2   Review of Systems  Constitutional:  Negative for malaise/fatigue.  Respiratory:  Negative for cough and shortness of breath.   Cardiovascular:  Negative for chest pain, palpitations and leg swelling.  Gastrointestinal:  Negative for abdominal pain, constipation and heartburn.  Musculoskeletal:  Negative for back pain, joint pain and myalgias.  Skin: Negative.   Neurological:  Negative for dizziness.  Psychiatric/Behavioral:  The patient is not nervous/anxious.     Physical Exam Constitutional:      General: She is not in acute distress.    Appearance: She is well-developed. She is not diaphoretic.  Neck:     Thyroid: No thyromegaly.  Cardiovascular:     Rate and Rhythm: Normal rate and regular rhythm.     Pulses: Normal pulses.     Heart sounds: Normal heart sounds.  Pulmonary:     Effort: Pulmonary effort is normal. No respiratory distress.     Breath sounds: Normal breath sounds.  Abdominal:     General: Bowel sounds are normal. There is no distension.      Palpations: Abdomen is soft.     Tenderness: There is no abdominal tenderness.  Musculoskeletal:        General: Normal range of motion.     Cervical back: Neck supple.     Right lower leg: No edema.     Left lower leg: No edema.  Lymphadenopathy:     Cervical: No cervical adenopathy.  Skin:    General: Skin is warm and dry.  Neurological:     Mental Status: She is alert. Mental status is at baseline.  Psychiatric:        Mood and Affect: Mood normal.       ASSESSMENT/ PLAN:   Patient is being discharged with the following home health services:  pt/ot to evaluate and treat as indicated for gait balance strength adl training.   Patient is being discharged with the following durable medical equipment:  none needed   Patient has been advised to f/u with their PCP in 1-2 weeks to for a transitions of care visit.  Social services at their facility was responsible for arranging this appointment.  Pt was provided with adequate prescriptions of noncontrolled medications to reach the scheduled appointment .  For controlled substances, a limited supply was provided as appropriate for the individual patient.  If the pt normally receives these medications from a pain clinic or has a contract with another physician, these medications should be received from that clinic or physician only).    Medications to be provided by the assisted living.   Synthia Innocent NP [facility]  call [phone]

## 2022-12-25 DIAGNOSIS — Z556 Problems related to health literacy: Secondary | ICD-10-CM | POA: Diagnosis not present

## 2022-12-25 DIAGNOSIS — D539 Nutritional anemia, unspecified: Secondary | ICD-10-CM | POA: Diagnosis not present

## 2022-12-25 DIAGNOSIS — Z9181 History of falling: Secondary | ICD-10-CM | POA: Diagnosis not present

## 2022-12-25 DIAGNOSIS — I5031 Acute diastolic (congestive) heart failure: Secondary | ICD-10-CM | POA: Diagnosis not present

## 2022-12-25 DIAGNOSIS — K219 Gastro-esophageal reflux disease without esophagitis: Secondary | ICD-10-CM | POA: Diagnosis not present

## 2022-12-25 DIAGNOSIS — M80031D Age-related osteoporosis with current pathological fracture, right forearm, subsequent encounter for fracture with routine healing: Secondary | ICD-10-CM | POA: Diagnosis not present

## 2022-12-25 DIAGNOSIS — F329 Major depressive disorder, single episode, unspecified: Secondary | ICD-10-CM | POA: Diagnosis not present

## 2022-12-25 DIAGNOSIS — I130 Hypertensive heart and chronic kidney disease with heart failure and stage 1 through stage 4 chronic kidney disease, or unspecified chronic kidney disease: Secondary | ICD-10-CM | POA: Diagnosis not present

## 2022-12-25 DIAGNOSIS — E039 Hypothyroidism, unspecified: Secondary | ICD-10-CM | POA: Diagnosis not present

## 2022-12-25 DIAGNOSIS — Z792 Long term (current) use of antibiotics: Secondary | ICD-10-CM | POA: Diagnosis not present

## 2022-12-25 DIAGNOSIS — M800AXD Age-related osteoporosis with current pathological fracture, other site, subsequent encounter for fracture with routine healing: Secondary | ICD-10-CM | POA: Diagnosis not present

## 2022-12-25 DIAGNOSIS — N1832 Chronic kidney disease, stage 3b: Secondary | ICD-10-CM | POA: Diagnosis not present

## 2022-12-25 DIAGNOSIS — J9601 Acute respiratory failure with hypoxia: Secondary | ICD-10-CM | POA: Diagnosis not present

## 2022-12-25 DIAGNOSIS — K5909 Other constipation: Secondary | ICD-10-CM | POA: Diagnosis not present

## 2022-12-25 DIAGNOSIS — M800B2D Age-related osteoporosis with current pathological fracture, left pelvis, subsequent encounter for fracture with routine healing: Secondary | ICD-10-CM | POA: Diagnosis not present

## 2022-12-25 DIAGNOSIS — I5022 Chronic systolic (congestive) heart failure: Secondary | ICD-10-CM | POA: Diagnosis not present

## 2022-12-25 DIAGNOSIS — E538 Deficiency of other specified B group vitamins: Secondary | ICD-10-CM | POA: Diagnosis not present

## 2022-12-25 DIAGNOSIS — M8008XD Age-related osteoporosis with current pathological fracture, vertebra(e), subsequent encounter for fracture with routine healing: Secondary | ICD-10-CM | POA: Diagnosis not present

## 2022-12-25 DIAGNOSIS — D72829 Elevated white blood cell count, unspecified: Secondary | ICD-10-CM | POA: Diagnosis not present

## 2022-12-25 DIAGNOSIS — I700 Atherosclerosis of aorta: Secondary | ICD-10-CM | POA: Diagnosis not present

## 2022-12-27 DIAGNOSIS — I5022 Chronic systolic (congestive) heart failure: Secondary | ICD-10-CM | POA: Diagnosis not present

## 2022-12-27 DIAGNOSIS — E039 Hypothyroidism, unspecified: Secondary | ICD-10-CM | POA: Diagnosis not present

## 2022-12-27 DIAGNOSIS — I130 Hypertensive heart and chronic kidney disease with heart failure and stage 1 through stage 4 chronic kidney disease, or unspecified chronic kidney disease: Secondary | ICD-10-CM | POA: Diagnosis not present

## 2022-12-27 DIAGNOSIS — F329 Major depressive disorder, single episode, unspecified: Secondary | ICD-10-CM | POA: Diagnosis not present

## 2022-12-27 DIAGNOSIS — I5031 Acute diastolic (congestive) heart failure: Secondary | ICD-10-CM | POA: Diagnosis not present

## 2022-12-27 DIAGNOSIS — I700 Atherosclerosis of aorta: Secondary | ICD-10-CM | POA: Diagnosis not present

## 2022-12-27 DIAGNOSIS — N1832 Chronic kidney disease, stage 3b: Secondary | ICD-10-CM | POA: Diagnosis not present

## 2022-12-27 DIAGNOSIS — M80031D Age-related osteoporosis with current pathological fracture, right forearm, subsequent encounter for fracture with routine healing: Secondary | ICD-10-CM | POA: Diagnosis not present

## 2022-12-27 DIAGNOSIS — M8008XD Age-related osteoporosis with current pathological fracture, vertebra(e), subsequent encounter for fracture with routine healing: Secondary | ICD-10-CM | POA: Diagnosis not present

## 2022-12-27 DIAGNOSIS — K219 Gastro-esophageal reflux disease without esophagitis: Secondary | ICD-10-CM | POA: Diagnosis not present

## 2022-12-27 DIAGNOSIS — D539 Nutritional anemia, unspecified: Secondary | ICD-10-CM | POA: Diagnosis not present

## 2022-12-27 DIAGNOSIS — K5909 Other constipation: Secondary | ICD-10-CM | POA: Diagnosis not present

## 2022-12-27 DIAGNOSIS — M800AXD Age-related osteoporosis with current pathological fracture, other site, subsequent encounter for fracture with routine healing: Secondary | ICD-10-CM | POA: Diagnosis not present

## 2022-12-27 DIAGNOSIS — M800B2D Age-related osteoporosis with current pathological fracture, left pelvis, subsequent encounter for fracture with routine healing: Secondary | ICD-10-CM | POA: Diagnosis not present

## 2022-12-27 DIAGNOSIS — D72829 Elevated white blood cell count, unspecified: Secondary | ICD-10-CM | POA: Diagnosis not present

## 2022-12-27 DIAGNOSIS — J9601 Acute respiratory failure with hypoxia: Secondary | ICD-10-CM | POA: Diagnosis not present

## 2022-12-30 DIAGNOSIS — J9601 Acute respiratory failure with hypoxia: Secondary | ICD-10-CM | POA: Diagnosis not present

## 2022-12-30 DIAGNOSIS — H52223 Regular astigmatism, bilateral: Secondary | ICD-10-CM | POA: Diagnosis not present

## 2022-12-30 DIAGNOSIS — M800AXD Age-related osteoporosis with current pathological fracture, other site, subsequent encounter for fracture with routine healing: Secondary | ICD-10-CM | POA: Diagnosis not present

## 2022-12-30 DIAGNOSIS — K5909 Other constipation: Secondary | ICD-10-CM | POA: Diagnosis not present

## 2022-12-30 DIAGNOSIS — I5022 Chronic systolic (congestive) heart failure: Secondary | ICD-10-CM | POA: Diagnosis not present

## 2022-12-30 DIAGNOSIS — M800B2D Age-related osteoporosis with current pathological fracture, left pelvis, subsequent encounter for fracture with routine healing: Secondary | ICD-10-CM | POA: Diagnosis not present

## 2022-12-30 DIAGNOSIS — I700 Atherosclerosis of aorta: Secondary | ICD-10-CM | POA: Diagnosis not present

## 2022-12-30 DIAGNOSIS — N1832 Chronic kidney disease, stage 3b: Secondary | ICD-10-CM | POA: Diagnosis not present

## 2022-12-30 DIAGNOSIS — M80031D Age-related osteoporosis with current pathological fracture, right forearm, subsequent encounter for fracture with routine healing: Secondary | ICD-10-CM | POA: Diagnosis not present

## 2022-12-30 DIAGNOSIS — I130 Hypertensive heart and chronic kidney disease with heart failure and stage 1 through stage 4 chronic kidney disease, or unspecified chronic kidney disease: Secondary | ICD-10-CM | POA: Diagnosis not present

## 2022-12-30 DIAGNOSIS — K219 Gastro-esophageal reflux disease without esophagitis: Secondary | ICD-10-CM | POA: Diagnosis not present

## 2022-12-30 DIAGNOSIS — D72829 Elevated white blood cell count, unspecified: Secondary | ICD-10-CM | POA: Diagnosis not present

## 2022-12-30 DIAGNOSIS — M8008XD Age-related osteoporosis with current pathological fracture, vertebra(e), subsequent encounter for fracture with routine healing: Secondary | ICD-10-CM | POA: Diagnosis not present

## 2022-12-30 DIAGNOSIS — F329 Major depressive disorder, single episode, unspecified: Secondary | ICD-10-CM | POA: Diagnosis not present

## 2022-12-30 DIAGNOSIS — D539 Nutritional anemia, unspecified: Secondary | ICD-10-CM | POA: Diagnosis not present

## 2022-12-30 DIAGNOSIS — I5031 Acute diastolic (congestive) heart failure: Secondary | ICD-10-CM | POA: Diagnosis not present

## 2022-12-30 DIAGNOSIS — E039 Hypothyroidism, unspecified: Secondary | ICD-10-CM | POA: Diagnosis not present

## 2023-01-04 DIAGNOSIS — E559 Vitamin D deficiency, unspecified: Secondary | ICD-10-CM | POA: Diagnosis not present

## 2023-01-04 DIAGNOSIS — M545 Low back pain, unspecified: Secondary | ICD-10-CM | POA: Diagnosis not present

## 2023-01-04 DIAGNOSIS — M6281 Muscle weakness (generalized): Secondary | ICD-10-CM | POA: Diagnosis not present

## 2023-01-05 DIAGNOSIS — N1832 Chronic kidney disease, stage 3b: Secondary | ICD-10-CM | POA: Diagnosis not present

## 2023-01-05 DIAGNOSIS — I5031 Acute diastolic (congestive) heart failure: Secondary | ICD-10-CM | POA: Diagnosis not present

## 2023-01-05 DIAGNOSIS — D72829 Elevated white blood cell count, unspecified: Secondary | ICD-10-CM | POA: Diagnosis not present

## 2023-01-05 DIAGNOSIS — J9601 Acute respiratory failure with hypoxia: Secondary | ICD-10-CM | POA: Diagnosis not present

## 2023-01-05 DIAGNOSIS — K5909 Other constipation: Secondary | ICD-10-CM | POA: Diagnosis not present

## 2023-01-05 DIAGNOSIS — D539 Nutritional anemia, unspecified: Secondary | ICD-10-CM | POA: Diagnosis not present

## 2023-01-05 DIAGNOSIS — M80031D Age-related osteoporosis with current pathological fracture, right forearm, subsequent encounter for fracture with routine healing: Secondary | ICD-10-CM | POA: Diagnosis not present

## 2023-01-05 DIAGNOSIS — I130 Hypertensive heart and chronic kidney disease with heart failure and stage 1 through stage 4 chronic kidney disease, or unspecified chronic kidney disease: Secondary | ICD-10-CM | POA: Diagnosis not present

## 2023-01-05 DIAGNOSIS — I700 Atherosclerosis of aorta: Secondary | ICD-10-CM | POA: Diagnosis not present

## 2023-01-05 DIAGNOSIS — E039 Hypothyroidism, unspecified: Secondary | ICD-10-CM | POA: Diagnosis not present

## 2023-01-05 DIAGNOSIS — M800AXD Age-related osteoporosis with current pathological fracture, other site, subsequent encounter for fracture with routine healing: Secondary | ICD-10-CM | POA: Diagnosis not present

## 2023-01-05 DIAGNOSIS — K219 Gastro-esophageal reflux disease without esophagitis: Secondary | ICD-10-CM | POA: Diagnosis not present

## 2023-01-05 DIAGNOSIS — M8008XD Age-related osteoporosis with current pathological fracture, vertebra(e), subsequent encounter for fracture with routine healing: Secondary | ICD-10-CM | POA: Diagnosis not present

## 2023-01-05 DIAGNOSIS — I5022 Chronic systolic (congestive) heart failure: Secondary | ICD-10-CM | POA: Diagnosis not present

## 2023-01-05 DIAGNOSIS — M800B2D Age-related osteoporosis with current pathological fracture, left pelvis, subsequent encounter for fracture with routine healing: Secondary | ICD-10-CM | POA: Diagnosis not present

## 2023-01-05 DIAGNOSIS — F329 Major depressive disorder, single episode, unspecified: Secondary | ICD-10-CM | POA: Diagnosis not present

## 2023-01-06 DIAGNOSIS — K219 Gastro-esophageal reflux disease without esophagitis: Secondary | ICD-10-CM | POA: Diagnosis not present

## 2023-01-06 DIAGNOSIS — J9601 Acute respiratory failure with hypoxia: Secondary | ICD-10-CM | POA: Diagnosis not present

## 2023-01-06 DIAGNOSIS — I700 Atherosclerosis of aorta: Secondary | ICD-10-CM | POA: Diagnosis not present

## 2023-01-06 DIAGNOSIS — E039 Hypothyroidism, unspecified: Secondary | ICD-10-CM | POA: Diagnosis not present

## 2023-01-06 DIAGNOSIS — D72829 Elevated white blood cell count, unspecified: Secondary | ICD-10-CM | POA: Diagnosis not present

## 2023-01-06 DIAGNOSIS — D539 Nutritional anemia, unspecified: Secondary | ICD-10-CM | POA: Diagnosis not present

## 2023-01-06 DIAGNOSIS — N1832 Chronic kidney disease, stage 3b: Secondary | ICD-10-CM | POA: Diagnosis not present

## 2023-01-06 DIAGNOSIS — F329 Major depressive disorder, single episode, unspecified: Secondary | ICD-10-CM | POA: Diagnosis not present

## 2023-01-06 DIAGNOSIS — M8008XD Age-related osteoporosis with current pathological fracture, vertebra(e), subsequent encounter for fracture with routine healing: Secondary | ICD-10-CM | POA: Diagnosis not present

## 2023-01-06 DIAGNOSIS — K5909 Other constipation: Secondary | ICD-10-CM | POA: Diagnosis not present

## 2023-01-06 DIAGNOSIS — I130 Hypertensive heart and chronic kidney disease with heart failure and stage 1 through stage 4 chronic kidney disease, or unspecified chronic kidney disease: Secondary | ICD-10-CM | POA: Diagnosis not present

## 2023-01-06 DIAGNOSIS — M800B2D Age-related osteoporosis with current pathological fracture, left pelvis, subsequent encounter for fracture with routine healing: Secondary | ICD-10-CM | POA: Diagnosis not present

## 2023-01-06 DIAGNOSIS — I5031 Acute diastolic (congestive) heart failure: Secondary | ICD-10-CM | POA: Diagnosis not present

## 2023-01-06 DIAGNOSIS — M800AXD Age-related osteoporosis with current pathological fracture, other site, subsequent encounter for fracture with routine healing: Secondary | ICD-10-CM | POA: Diagnosis not present

## 2023-01-06 DIAGNOSIS — M80031D Age-related osteoporosis with current pathological fracture, right forearm, subsequent encounter for fracture with routine healing: Secondary | ICD-10-CM | POA: Diagnosis not present

## 2023-01-06 DIAGNOSIS — I5022 Chronic systolic (congestive) heart failure: Secondary | ICD-10-CM | POA: Diagnosis not present

## 2023-01-10 DIAGNOSIS — I700 Atherosclerosis of aorta: Secondary | ICD-10-CM | POA: Diagnosis not present

## 2023-01-10 DIAGNOSIS — I5022 Chronic systolic (congestive) heart failure: Secondary | ICD-10-CM | POA: Diagnosis not present

## 2023-01-10 DIAGNOSIS — M800B2D Age-related osteoporosis with current pathological fracture, left pelvis, subsequent encounter for fracture with routine healing: Secondary | ICD-10-CM | POA: Diagnosis not present

## 2023-01-10 DIAGNOSIS — D539 Nutritional anemia, unspecified: Secondary | ICD-10-CM | POA: Diagnosis not present

## 2023-01-10 DIAGNOSIS — N1832 Chronic kidney disease, stage 3b: Secondary | ICD-10-CM | POA: Diagnosis not present

## 2023-01-10 DIAGNOSIS — J9601 Acute respiratory failure with hypoxia: Secondary | ICD-10-CM | POA: Diagnosis not present

## 2023-01-10 DIAGNOSIS — M800AXD Age-related osteoporosis with current pathological fracture, other site, subsequent encounter for fracture with routine healing: Secondary | ICD-10-CM | POA: Diagnosis not present

## 2023-01-10 DIAGNOSIS — K219 Gastro-esophageal reflux disease without esophagitis: Secondary | ICD-10-CM | POA: Diagnosis not present

## 2023-01-10 DIAGNOSIS — D72829 Elevated white blood cell count, unspecified: Secondary | ICD-10-CM | POA: Diagnosis not present

## 2023-01-10 DIAGNOSIS — I130 Hypertensive heart and chronic kidney disease with heart failure and stage 1 through stage 4 chronic kidney disease, or unspecified chronic kidney disease: Secondary | ICD-10-CM | POA: Diagnosis not present

## 2023-01-10 DIAGNOSIS — I5031 Acute diastolic (congestive) heart failure: Secondary | ICD-10-CM | POA: Diagnosis not present

## 2023-01-10 DIAGNOSIS — F329 Major depressive disorder, single episode, unspecified: Secondary | ICD-10-CM | POA: Diagnosis not present

## 2023-01-10 DIAGNOSIS — E039 Hypothyroidism, unspecified: Secondary | ICD-10-CM | POA: Diagnosis not present

## 2023-01-10 DIAGNOSIS — M80031D Age-related osteoporosis with current pathological fracture, right forearm, subsequent encounter for fracture with routine healing: Secondary | ICD-10-CM | POA: Diagnosis not present

## 2023-01-10 DIAGNOSIS — K5909 Other constipation: Secondary | ICD-10-CM | POA: Diagnosis not present

## 2023-01-10 DIAGNOSIS — M8008XD Age-related osteoporosis with current pathological fracture, vertebra(e), subsequent encounter for fracture with routine healing: Secondary | ICD-10-CM | POA: Diagnosis not present

## 2023-01-11 DIAGNOSIS — F329 Major depressive disorder, single episode, unspecified: Secondary | ICD-10-CM | POA: Diagnosis not present

## 2023-01-11 DIAGNOSIS — M9665 Fracture of pelvis following insertion of orthopedic implant, joint prosthesis, or bone plate: Secondary | ICD-10-CM | POA: Diagnosis not present

## 2023-01-11 DIAGNOSIS — I10 Essential (primary) hypertension: Secondary | ICD-10-CM | POA: Diagnosis not present

## 2023-01-11 DIAGNOSIS — E785 Hyperlipidemia, unspecified: Secondary | ICD-10-CM | POA: Diagnosis not present

## 2023-01-11 DIAGNOSIS — I509 Heart failure, unspecified: Secondary | ICD-10-CM | POA: Diagnosis not present

## 2023-01-12 DIAGNOSIS — I130 Hypertensive heart and chronic kidney disease with heart failure and stage 1 through stage 4 chronic kidney disease, or unspecified chronic kidney disease: Secondary | ICD-10-CM | POA: Diagnosis not present

## 2023-01-12 DIAGNOSIS — D539 Nutritional anemia, unspecified: Secondary | ICD-10-CM | POA: Diagnosis not present

## 2023-01-12 DIAGNOSIS — M8008XD Age-related osteoporosis with current pathological fracture, vertebra(e), subsequent encounter for fracture with routine healing: Secondary | ICD-10-CM | POA: Diagnosis not present

## 2023-01-12 DIAGNOSIS — M80031D Age-related osteoporosis with current pathological fracture, right forearm, subsequent encounter for fracture with routine healing: Secondary | ICD-10-CM | POA: Diagnosis not present

## 2023-01-12 DIAGNOSIS — I5022 Chronic systolic (congestive) heart failure: Secondary | ICD-10-CM | POA: Diagnosis not present

## 2023-01-12 DIAGNOSIS — N1832 Chronic kidney disease, stage 3b: Secondary | ICD-10-CM | POA: Diagnosis not present

## 2023-01-12 DIAGNOSIS — K5909 Other constipation: Secondary | ICD-10-CM | POA: Diagnosis not present

## 2023-01-12 DIAGNOSIS — I700 Atherosclerosis of aorta: Secondary | ICD-10-CM | POA: Diagnosis not present

## 2023-01-12 DIAGNOSIS — E039 Hypothyroidism, unspecified: Secondary | ICD-10-CM | POA: Diagnosis not present

## 2023-01-12 DIAGNOSIS — F329 Major depressive disorder, single episode, unspecified: Secondary | ICD-10-CM | POA: Diagnosis not present

## 2023-01-12 DIAGNOSIS — I5031 Acute diastolic (congestive) heart failure: Secondary | ICD-10-CM | POA: Diagnosis not present

## 2023-01-12 DIAGNOSIS — M800B2D Age-related osteoporosis with current pathological fracture, left pelvis, subsequent encounter for fracture with routine healing: Secondary | ICD-10-CM | POA: Diagnosis not present

## 2023-01-12 DIAGNOSIS — J9601 Acute respiratory failure with hypoxia: Secondary | ICD-10-CM | POA: Diagnosis not present

## 2023-01-12 DIAGNOSIS — M800AXD Age-related osteoporosis with current pathological fracture, other site, subsequent encounter for fracture with routine healing: Secondary | ICD-10-CM | POA: Diagnosis not present

## 2023-01-12 DIAGNOSIS — D72829 Elevated white blood cell count, unspecified: Secondary | ICD-10-CM | POA: Diagnosis not present

## 2023-01-12 DIAGNOSIS — K219 Gastro-esophageal reflux disease without esophagitis: Secondary | ICD-10-CM | POA: Diagnosis not present

## 2023-01-19 DIAGNOSIS — I700 Atherosclerosis of aorta: Secondary | ICD-10-CM | POA: Diagnosis not present

## 2023-01-19 DIAGNOSIS — F329 Major depressive disorder, single episode, unspecified: Secondary | ICD-10-CM | POA: Diagnosis not present

## 2023-01-19 DIAGNOSIS — N1832 Chronic kidney disease, stage 3b: Secondary | ICD-10-CM | POA: Diagnosis not present

## 2023-01-19 DIAGNOSIS — D539 Nutritional anemia, unspecified: Secondary | ICD-10-CM | POA: Diagnosis not present

## 2023-01-19 DIAGNOSIS — I130 Hypertensive heart and chronic kidney disease with heart failure and stage 1 through stage 4 chronic kidney disease, or unspecified chronic kidney disease: Secondary | ICD-10-CM | POA: Diagnosis not present

## 2023-01-19 DIAGNOSIS — M80031D Age-related osteoporosis with current pathological fracture, right forearm, subsequent encounter for fracture with routine healing: Secondary | ICD-10-CM | POA: Diagnosis not present

## 2023-01-19 DIAGNOSIS — M8008XD Age-related osteoporosis with current pathological fracture, vertebra(e), subsequent encounter for fracture with routine healing: Secondary | ICD-10-CM | POA: Diagnosis not present

## 2023-01-19 DIAGNOSIS — E039 Hypothyroidism, unspecified: Secondary | ICD-10-CM | POA: Diagnosis not present

## 2023-01-19 DIAGNOSIS — M800AXD Age-related osteoporosis with current pathological fracture, other site, subsequent encounter for fracture with routine healing: Secondary | ICD-10-CM | POA: Diagnosis not present

## 2023-01-19 DIAGNOSIS — K219 Gastro-esophageal reflux disease without esophagitis: Secondary | ICD-10-CM | POA: Diagnosis not present

## 2023-01-19 DIAGNOSIS — J9601 Acute respiratory failure with hypoxia: Secondary | ICD-10-CM | POA: Diagnosis not present

## 2023-01-19 DIAGNOSIS — I5022 Chronic systolic (congestive) heart failure: Secondary | ICD-10-CM | POA: Diagnosis not present

## 2023-01-19 DIAGNOSIS — R0602 Shortness of breath: Secondary | ICD-10-CM | POA: Diagnosis not present

## 2023-01-19 DIAGNOSIS — K5909 Other constipation: Secondary | ICD-10-CM | POA: Diagnosis not present

## 2023-01-19 DIAGNOSIS — I5031 Acute diastolic (congestive) heart failure: Secondary | ICD-10-CM | POA: Diagnosis not present

## 2023-01-19 DIAGNOSIS — D72829 Elevated white blood cell count, unspecified: Secondary | ICD-10-CM | POA: Diagnosis not present

## 2023-01-19 DIAGNOSIS — M800B2D Age-related osteoporosis with current pathological fracture, left pelvis, subsequent encounter for fracture with routine healing: Secondary | ICD-10-CM | POA: Diagnosis not present

## 2023-01-20 ENCOUNTER — Emergency Department (HOSPITAL_COMMUNITY): Payer: Medicare Other

## 2023-01-20 ENCOUNTER — Other Ambulatory Visit: Payer: Self-pay

## 2023-01-20 ENCOUNTER — Inpatient Hospital Stay (HOSPITAL_COMMUNITY)
Admission: EM | Admit: 2023-01-20 | Discharge: 2023-01-22 | DRG: 193 | Disposition: A | Discharge status: Skilled Nursing Facility | Payer: Medicare Other | Source: Skilled Nursing Facility | Attending: Family Medicine | Admitting: Family Medicine

## 2023-01-20 DIAGNOSIS — J189 Pneumonia, unspecified organism: Principal | ICD-10-CM | POA: Diagnosis present

## 2023-01-20 DIAGNOSIS — S32592D Other specified fracture of left pubis, subsequent encounter for fracture with routine healing: Secondary | ICD-10-CM | POA: Diagnosis not present

## 2023-01-20 DIAGNOSIS — I5042 Chronic combined systolic (congestive) and diastolic (congestive) heart failure: Secondary | ICD-10-CM | POA: Diagnosis not present

## 2023-01-20 DIAGNOSIS — K5909 Other constipation: Secondary | ICD-10-CM | POA: Diagnosis not present

## 2023-01-20 DIAGNOSIS — S32010A Wedge compression fracture of first lumbar vertebra, initial encounter for closed fracture: Secondary | ICD-10-CM | POA: Diagnosis present

## 2023-01-20 DIAGNOSIS — D529 Folate deficiency anemia, unspecified: Secondary | ICD-10-CM | POA: Diagnosis present

## 2023-01-20 DIAGNOSIS — Z66 Do not resuscitate: Secondary | ICD-10-CM | POA: Diagnosis present

## 2023-01-20 DIAGNOSIS — N1832 Chronic kidney disease, stage 3b: Secondary | ICD-10-CM | POA: Diagnosis not present

## 2023-01-20 DIAGNOSIS — I2489 Other forms of acute ischemic heart disease: Secondary | ICD-10-CM | POA: Diagnosis not present

## 2023-01-20 DIAGNOSIS — D519 Vitamin B12 deficiency anemia, unspecified: Secondary | ICD-10-CM | POA: Diagnosis not present

## 2023-01-20 DIAGNOSIS — I5022 Chronic systolic (congestive) heart failure: Secondary | ICD-10-CM | POA: Diagnosis not present

## 2023-01-20 DIAGNOSIS — R918 Other nonspecific abnormal finding of lung field: Secondary | ICD-10-CM | POA: Diagnosis not present

## 2023-01-20 DIAGNOSIS — K219 Gastro-esophageal reflux disease without esophagitis: Secondary | ICD-10-CM | POA: Diagnosis present

## 2023-01-20 DIAGNOSIS — S32010S Wedge compression fracture of first lumbar vertebra, sequela: Secondary | ICD-10-CM | POA: Diagnosis not present

## 2023-01-20 DIAGNOSIS — R7989 Other specified abnormal findings of blood chemistry: Secondary | ICD-10-CM

## 2023-01-20 DIAGNOSIS — I447 Left bundle-branch block, unspecified: Secondary | ICD-10-CM | POA: Diagnosis not present

## 2023-01-20 DIAGNOSIS — I272 Pulmonary hypertension, unspecified: Secondary | ICD-10-CM | POA: Diagnosis present

## 2023-01-20 DIAGNOSIS — M4856XA Collapsed vertebra, not elsewhere classified, lumbar region, initial encounter for fracture: Secondary | ICD-10-CM | POA: Diagnosis present

## 2023-01-20 DIAGNOSIS — Z79899 Other long term (current) drug therapy: Secondary | ICD-10-CM | POA: Diagnosis not present

## 2023-01-20 DIAGNOSIS — R5381 Other malaise: Secondary | ICD-10-CM | POA: Diagnosis present

## 2023-01-20 DIAGNOSIS — J9811 Atelectasis: Secondary | ICD-10-CM | POA: Diagnosis not present

## 2023-01-20 DIAGNOSIS — J90 Pleural effusion, not elsewhere classified: Secondary | ICD-10-CM | POA: Diagnosis not present

## 2023-01-20 DIAGNOSIS — E785 Hyperlipidemia, unspecified: Secondary | ICD-10-CM | POA: Diagnosis not present

## 2023-01-20 DIAGNOSIS — J9601 Acute respiratory failure with hypoxia: Secondary | ICD-10-CM | POA: Diagnosis present

## 2023-01-20 DIAGNOSIS — I130 Hypertensive heart and chronic kidney disease with heart failure and stage 1 through stage 4 chronic kidney disease, or unspecified chronic kidney disease: Secondary | ICD-10-CM | POA: Diagnosis not present

## 2023-01-20 DIAGNOSIS — R0902 Hypoxemia: Secondary | ICD-10-CM | POA: Diagnosis not present

## 2023-01-20 DIAGNOSIS — E039 Hypothyroidism, unspecified: Secondary | ICD-10-CM | POA: Diagnosis present

## 2023-01-20 DIAGNOSIS — I499 Cardiac arrhythmia, unspecified: Secondary | ICD-10-CM | POA: Diagnosis not present

## 2023-01-20 DIAGNOSIS — Z1152 Encounter for screening for COVID-19: Secondary | ICD-10-CM

## 2023-01-20 DIAGNOSIS — J984 Other disorders of lung: Secondary | ICD-10-CM | POA: Diagnosis not present

## 2023-01-20 DIAGNOSIS — R778 Other specified abnormalities of plasma proteins: Secondary | ICD-10-CM | POA: Diagnosis not present

## 2023-01-20 DIAGNOSIS — Z825 Family history of asthma and other chronic lower respiratory diseases: Secondary | ICD-10-CM

## 2023-01-20 DIAGNOSIS — M810 Age-related osteoporosis without current pathological fracture: Secondary | ICD-10-CM | POA: Diagnosis present

## 2023-01-20 DIAGNOSIS — I5023 Acute on chronic systolic (congestive) heart failure: Secondary | ICD-10-CM | POA: Diagnosis present

## 2023-01-20 DIAGNOSIS — E538 Deficiency of other specified B group vitamins: Secondary | ICD-10-CM | POA: Diagnosis not present

## 2023-01-20 DIAGNOSIS — E860 Dehydration: Secondary | ICD-10-CM | POA: Diagnosis not present

## 2023-01-20 DIAGNOSIS — N179 Acute kidney failure, unspecified: Secondary | ICD-10-CM | POA: Diagnosis not present

## 2023-01-20 DIAGNOSIS — I4891 Unspecified atrial fibrillation: Secondary | ICD-10-CM | POA: Diagnosis not present

## 2023-01-20 DIAGNOSIS — I10 Essential (primary) hypertension: Secondary | ICD-10-CM | POA: Diagnosis not present

## 2023-01-20 DIAGNOSIS — R000 Tachycardia, unspecified: Secondary | ICD-10-CM | POA: Diagnosis not present

## 2023-01-20 DIAGNOSIS — R0602 Shortness of breath: Secondary | ICD-10-CM | POA: Diagnosis not present

## 2023-01-20 LAB — BASIC METABOLIC PANEL
Anion gap: 12 (ref 5–15)
BUN: 28 mg/dL — ABNORMAL HIGH (ref 8–23)
CO2: 22 mmol/L (ref 22–32)
Calcium: 8.8 mg/dL — ABNORMAL LOW (ref 8.9–10.3)
Chloride: 106 mmol/L (ref 98–111)
Creatinine, Ser: 1.41 mg/dL — ABNORMAL HIGH (ref 0.44–1.00)
GFR, Estimated: 33 mL/min — ABNORMAL LOW (ref >60)
Glucose, Bld: 118 mg/dL — ABNORMAL HIGH (ref 70–99)
Potassium: 3.9 mmol/L (ref 3.5–5.1)
Sodium: 140 mmol/L (ref 135–145)

## 2023-01-20 LAB — CBC
HCT: 36.5 % (ref 36.0–46.0)
Hemoglobin: 11.5 g/dL — ABNORMAL LOW (ref 12.0–15.0)
MCH: 32.2 pg (ref 26.0–34.0)
MCHC: 31.5 g/dL (ref 30.0–36.0)
MCV: 102.2 fL — ABNORMAL HIGH (ref 80.0–100.0)
Platelets: 178 10*3/uL (ref 150–400)
RBC: 3.57 MIL/uL — ABNORMAL LOW (ref 3.87–5.11)
RDW: 14.8 % (ref 11.5–15.5)
WBC: 14.8 10*3/uL — ABNORMAL HIGH (ref 4.0–10.5)
nRBC: 0 % (ref 0.0–0.2)

## 2023-01-20 LAB — PHOSPHORUS: Phosphorus: 3.4 mg/dL (ref 2.5–4.6)

## 2023-01-20 LAB — MAGNESIUM: Magnesium: 2.2 mg/dL (ref 1.7–2.4)

## 2023-01-20 LAB — TROPONIN I (HIGH SENSITIVITY)
Troponin I (High Sensitivity): 83 ng/L — ABNORMAL HIGH (ref <18)
Troponin I (High Sensitivity): 131 ng/L (ref <18)
Troponin I (High Sensitivity): 161 ng/L (ref <18)

## 2023-01-20 LAB — SARS CORONAVIRUS 2 BY RT PCR: SARS Coronavirus 2 by RT PCR: NEGATIVE

## 2023-01-20 MED ORDER — DM-GUAIFENESIN ER 30-600 MG PO TB12
1.0000 | ORAL_TABLET | Freq: Two times a day (BID) | ORAL | Status: DC
  Administered 2023-01-20 – 2023-01-22 (×5): 1 via ORAL
  Filled 2023-01-20 (×5): qty 1

## 2023-01-20 MED ORDER — ONDANSETRON HCL 4 MG PO TABS: 4.0000 mg | ORAL_TABLET | Freq: Four times a day (QID) | ORAL | Status: DC | PRN

## 2023-01-20 MED ORDER — ONDANSETRON HCL 4 MG/2ML IJ SOLN: 4.0000 mg | Freq: Four times a day (QID) | INTRAMUSCULAR | Status: DC | PRN

## 2023-01-20 MED ORDER — IPRATROPIUM-ALBUTEROL 0.5-2.5 (3) MG/3ML IN SOLN
3.0000 mL | Freq: Four times a day (QID) | RESPIRATORY_TRACT | Status: DC | PRN
  Administered 2023-01-22: 3 mL via RESPIRATORY_TRACT
  Filled 2023-01-20: qty 3

## 2023-01-20 MED ORDER — IOHEXOL 350 MG/ML SOLN
60.0000 mL | Freq: Once | INTRAVENOUS | Status: AC | PRN
  Administered 2023-01-20: 60 mL via INTRAVENOUS

## 2023-01-20 MED ORDER — FOLIC ACID 1 MG PO TABS
1.0000 mg | ORAL_TABLET | Freq: Every day | ORAL | Status: DC
  Administered 2023-01-20 – 2023-01-22 (×3): 1 mg via ORAL
  Filled 2023-01-20 (×3): qty 1

## 2023-01-20 MED ORDER — SODIUM CHLORIDE 0.9 % IV SOLN
2.0000 g | INTRAVENOUS | Status: DC
  Administered 2023-01-20 – 2023-01-21 (×2): 2 g via INTRAVENOUS
  Filled 2023-01-20 (×2): qty 20

## 2023-01-20 MED ORDER — SENNOSIDES-DOCUSATE SODIUM 8.6-50 MG PO TABS
1.0000 | ORAL_TABLET | Freq: Two times a day (BID) | ORAL | Status: DC
  Administered 2023-01-20 – 2023-01-22 (×5): 1 via ORAL
  Filled 2023-01-20 (×5): qty 1

## 2023-01-20 MED ORDER — ENOXAPARIN SODIUM 30 MG/0.3ML IJ SOSY
30.0000 mg | PREFILLED_SYRINGE | INTRAMUSCULAR | Status: DC
  Administered 2023-01-20 – 2023-01-21 (×2): 30 mg via SUBCUTANEOUS
  Filled 2023-01-20 (×2): qty 0.3

## 2023-01-20 MED ORDER — ACETAMINOPHEN 650 MG RE SUPP: 650.0000 mg | Freq: Four times a day (QID) | RECTAL | Status: DC | PRN

## 2023-01-20 MED ORDER — FAMOTIDINE 20 MG PO TABS
20.0000 mg | ORAL_TABLET | Freq: Every day | ORAL | Status: DC
  Administered 2023-01-20 – 2023-01-21 (×2): 20 mg via ORAL
  Filled 2023-01-20 (×2): qty 1

## 2023-01-20 MED ORDER — AMLODIPINE BESYLATE 5 MG PO TABS
10.0000 mg | ORAL_TABLET | Freq: Once | ORAL | Status: AC
  Administered 2023-01-20: 10 mg via ORAL
  Filled 2023-01-20: qty 2

## 2023-01-20 MED ORDER — SODIUM CHLORIDE 0.9 % IV SOLN
1.0000 g | Freq: Once | INTRAVENOUS | Status: AC
  Administered 2023-01-20: 1 g via INTRAVENOUS
  Filled 2023-01-20: qty 10

## 2023-01-20 MED ORDER — BUDESONIDE 0.5 MG/2ML IN SUSP
0.5000 mg | Freq: Two times a day (BID) | RESPIRATORY_TRACT | Status: DC
  Administered 2023-01-20 – 2023-01-22 (×4): 0.5 mg via RESPIRATORY_TRACT
  Filled 2023-01-20 (×4): qty 2

## 2023-01-20 MED ORDER — LACTATED RINGERS IV BOLUS
500.0000 mL | Freq: Once | INTRAVENOUS | Status: AC
  Administered 2023-01-20: 500 mL via INTRAVENOUS

## 2023-01-20 MED ORDER — SODIUM CHLORIDE 0.9 % IV SOLN
500.0000 mg | Freq: Once | INTRAVENOUS | Status: AC
  Administered 2023-01-20: 500 mg via INTRAVENOUS
  Filled 2023-01-20: qty 5

## 2023-01-20 MED ORDER — SODIUM CHLORIDE 0.9 % IV SOLN: INTRAVENOUS | Status: AC

## 2023-01-20 MED ORDER — SODIUM CHLORIDE 0.9 % IV SOLN
500.0000 mg | INTRAVENOUS | Status: DC
  Administered 2023-01-20 – 2023-01-21 (×2): 500 mg via INTRAVENOUS
  Filled 2023-01-20 (×2): qty 5

## 2023-01-20 MED ORDER — ACETAMINOPHEN 325 MG PO TABS: 650.0000 mg | ORAL_TABLET | Freq: Four times a day (QID) | ORAL | Status: DC | PRN

## 2023-01-20 MED ORDER — AMLODIPINE BESYLATE 5 MG PO TABS
10.0000 mg | ORAL_TABLET | Freq: Every day | ORAL | Status: DC
  Administered 2023-01-20 – 2023-01-22 (×3): 10 mg via ORAL
  Filled 2023-01-20 (×3): qty 2

## 2023-01-20 MED ORDER — SERTRALINE HCL 50 MG PO TABS
100.0000 mg | ORAL_TABLET | Freq: Every day | ORAL | Status: DC
  Administered 2023-01-20 – 2023-01-22 (×3): 100 mg via ORAL
  Filled 2023-01-20 (×3): qty 2

## 2023-01-20 MED ORDER — POLYETHYLENE GLYCOL 3350 17 G PO PACK: 17.0000 g | PACK | Freq: Every day | ORAL | Status: DC | PRN

## 2023-01-20 NOTE — H&P (Signed)
History and Physical    Patient: Catherine West EXB:[id number] DOB: August 29, 1920 DOA: 01/20/2023 DOS: the patient was seen and examined on 01/20/2023 PCP: Benita Stabile, MD  Patient coming from: ALF/ILF  Chief Complaint:  Chief Complaint  Patient presents with   Shortness of Breath   HPI: Catherine West is a 89 y.o. female with medical history significant of hypertension, hyperlipidemia, chronic kidney disease stage IIIb, hypothyroidism and gastroesophageal reflux disease; who presented to the hospital from assisted living facility secondary to increased coughing spells and shortness of breath.  Patient found to be hypoxic on room air.  No fever or chills reported.  Patient expressed no chest pain, no nausea, no vomiting, no dysuria, no hematuria, no melena, no hematochezia or focal weaknesses.  Patient expressed decreased oral intake associated with general malaise.  Workup in the ED demonstrating negative COVID PCR, positive hypoxia on room air requiring 2 L nasal cannula supplementation.  There was also appreciated tachypnea.  Chest x-ray with bronchitic changes and CT scan demonstrating no pulmonary embolism and confirming the presence of multilobar pneumonia.  Cultures taken, antibiotics started and TRH consulted to place patient in the hospital for further evaluation and management.  Review of Systems: As mentioned in the history of present illness. All other systems reviewed and are negative. Past Medical History:  Diagnosis Date   Acute bronchitis    Acute bronchitis    Chronic kidney disease, stage 2 (mild)    Chronic kidney disease, unspecified    Cough    Essential (primary) hypertension    HTN (hypertension)    Hyperlipidemia, unspecified    Hypertensive chronic kidney disease with stage 1 through stage 4 chronic kidney disease, or unspecified chronic kidney disease    Hypothyroidism, unspecified    Major depressive disorder, single episode, unspecified    Osteoporosis     Osteoporosis    Pneumonia    Pneumonia    Urinary tract infection    Urinary tract infection    Past Surgical History:  Procedure Laterality Date   LESION REMOVAL Left 11/10/2022   Procedure: LESION REMOVAL TRUNK;  Surgeon: [name], MD;  Location: [facility];  Service: General;  Laterality: Left;  left lower trunk   Social History:  reports that she has never smoked. She has never used smokeless tobacco. She reports that she does not drink alcohol and does not use drugs.  No Known Allergies  Family History  Problem Relation Age of Onset   Other Father        brain tumor, inoperable   Emphysema Sister        smoker   Stroke Neg Hx    Parkinson's disease Neg Hx    Dementia Neg Hx     Prior to Admission medications   Medication Sig Start Date End Date Taking? Authorizing Provider  acetaminophen (TYLENOL) 650 MG CR tablet Take 650 mg by mouth every 8 (eight) hours as needed for pain.   Yes [provider]  amLODipine (NORVASC) 10 MG tablet Take 1 tablet (10 mg total) by mouth daily. 12/07/22  Yes Osvaldo Shipper, MD  Cholecalciferol (VITAMIN D) 50 MCG (2000 UT) CAPS Take 2,000 Units by mouth daily.   Yes [provider]  famotidine (PEPCID) 20 MG tablet Take 1 tablet (20 mg total) by mouth daily. 12/06/22  Yes Osvaldo Shipper, MD  folic acid (FOLVITE) 1 MG tablet Take 1 tablet (1 mg total) by mouth daily. 12/06/22  Yes Osvaldo Shipper, MD  loperamide (  IMODIUM A-D) 2 MG tablet Take 2 mg by mouth every 6 (six) hours as needed for diarrhea or loose stools.   Yes [provider]  losartan (COZAAR) 50 MG tablet Take 50 mg by mouth daily. 01/18/23  Yes [provider]  sertraline (ZOLOFT) 100 MG tablet Take 100 mg by mouth daily.   Yes [provider]  vitamin B-12 (VITAMIN B12) 500 MCG tablet Take 1 tablet (500 mcg total) by mouth daily. 12/06/22  Yes Osvaldo Shipper, MD  polyethylene glycol (MIRALAX / GLYCOLAX) 17 g packet Take 17 g by mouth  daily. 12/06/22   Osvaldo Shipper, MD  senna-docusate (SENOKOT-S) 8.6-50 MG tablet Take 2 tablets by mouth 2 (two) times daily. Patient taking differently: Take 1 tablet by mouth 2 (two) times daily. 12/06/22   Osvaldo Shipper, MD    Physical Exam: Vitals:   01/20/23 1330 01/20/23 1345 01/20/23 1400 01/20/23 1714  BP: (!) 138/107 (!) 167/151 (!) 166/78 (!) 144/74  Pulse: 71 69  66  Resp: (!) 22 (!) 21 19 16   Temp:    98 F (36.7 C)  TempSrc:    Oral  SpO2: 97% 97%  92%   General exam: Alert, awake, following commands appropriately and expressing no chest pain.  Reported intermittent mildly productive coughing spells.  Afebrile. Respiratory system: Mild expiratory wheezing; positive rhonchi (right more than left).  No using accessory muscle. Cardiovascular system: Rate controlled, no rubs, no gallops, no JVD. Gastrointestinal system: Abdomen is nondistended, soft and nontender. No organomegaly or masses felt. Normal bowel sounds heard. Central nervous system: No focal neurological deficits. Extremities: No cyanosis or clubbing; trace edema appreciated bilaterally. Skin: No petechiae. Psychiatry: Judgement and insight appear normal. Mood & affect appropriate.   Data Reviewed: CBC: White blood cell 14.8, hemoglobin 11.5 and platelet counts 178K Basic metabolic panel: Sodium 140, potassium 3.9, chloride 106, bicarb 22, BUN 28, creatinine 1.41 and GFR 33 COVID PCR: Negative Troponin: 83>>130  Assessment and Plan: Acute renal failure superimposed on stage 3b chronic kidney disease (HCC) -Demise nephrotoxic agents -Avoid the use of contrast and avoid hypotension -Judicious fluid resuscitation (patient with underlying history of CHF). -Follow renal function trend.  Chronic HFrEF (heart failure with reduced ejection fraction) (HCC) -Compensated; and flat patient is slightly dehydrated on presentation. -Holding diuretics -Judicious fluid resuscitation will be provided -Continue to follow  daily weights and strict intake and output -Heart healthy/low-sodium diet discussed with patient and family at bedside.  Vitamin B 12 deficiency -B12 supplementation.  GERD without esophagitis -Continue the use of Pepcid.  Chronic constipation -Continue home laxative bowel regimen -Maintain adequate hydration.  Compression fracture of L1 lumbar vertebra (HCC) -Continue as needed analgesics.  Hypothyroidism, unspecified -Recent TSH 3.810 -Continue Synthroid.  Hyperlipidemia, unspecified -Continue heart healthy diet.  Essential (primary) hypertension -Continue Norvasc -Follow vital signs.  Acute respiratory failure with hypoxia (HCC) -In the setting of pneumonia -High concern for aspiration process as triggering cause -Speech therapy evaluation requested -Continue IV antibiotics, bronchodilator management, mucolytic's and oxygen supplementation. Follow clinical response and continue supportive care.   Elevated troponin: -No chest pain -No acute ischemic changes on telemetry or EKG -Most likely associated with worsening renal function and demand ischemia from acute pneumonia process -2D echo will be ordered. -Provide gentle fluid resuscitation and follow troponin trend.   Advance Care Planning:   Code Status: Limited: Do not attempt resuscitation (DNR) -DNR-LIMITED -Do Not Intubate/DNI    Consults: none   Family Communication: son at bedside  Severity  of Illness: The appropriate patient status for this patient is OBSERVATION. Observation status is judged to be reasonable and necessary in order to provide the required intensity of service to ensure the patient's safety. The patient's presenting symptoms, physical exam findings, and initial radiographic and laboratory data in the context of their medical condition is felt to place them at decreased risk for further clinical deterioration. Furthermore, it is anticipated that the patient will be medically stable for discharge  from the hospital within 2 midnights of admission.   Author: Vassie Loll, MD 01/20/2023 6:53 PM  For on call review [email or web address].

## 2023-01-20 NOTE — Assessment & Plan Note (Signed)
-  In the setting of pneumonia -High concern for aspiration process as triggering cause -Speech therapy evaluation requested -Continue IV antibiotics, bronchodilator management, mucolytic's and oxygen supplementation. Follow clinical response and continue supportive care.

## 2023-01-20 NOTE — Assessment & Plan Note (Signed)
-  Continue as needed analgesics. ? ?

## 2023-01-20 NOTE — Assessment & Plan Note (Signed)
-  Compensated; and flat patient is slightly dehydrated on presentation. -Holding diuretics -Judicious fluid resuscitation will be provided -Continue to follow daily weights and strict intake and output -Heart healthy/low-sodium diet discussed with patient and family at bedside.

## 2023-01-20 NOTE — Assessment & Plan Note (Signed)
-  Demise nephrotoxic agents -Avoid the use of contrast and avoid hypotension -Judicious fluid resuscitation (patient with underlying history of CHF). -Follow renal function trend.

## 2023-01-20 NOTE — Assessment & Plan Note (Signed)
-  Continue Norvasc -Follow vital signs.

## 2023-01-20 NOTE — ED Notes (Signed)
MD at bedside to discuss with pt and family plan of care for pt

## 2023-01-20 NOTE — ED Provider Notes (Signed)
[place] EMERGENCY DEPARTMENT AT [facility] Provider Note   CSN: [id number] Arrival date & time: 01/20/23  0745     History  Chief Complaint  Patient presents with   Shortness of Breath    Catherine West is a 89 y.o. female.  This is a 89 year old female presenting emergency department for shortness of breath.  Patient reported feeling okay yesterday.  This morning had acute onset shortness of breath.  Was given DuoNeb and route and reports feeling improved.  Denies any history of COPD or asthma.  States that Catherine West currently just feels "cold".   Shortness of Breath      Home Medications Prior to Admission medications   Medication Sig Start Date End Date Taking? Authorizing Provider  acetaminophen (TYLENOL) 650 MG CR tablet Take 650 mg by mouth every 8 (eight) hours as needed for pain.   Yes [provider]  amLODipine (NORVASC) 10 MG tablet Take 1 tablet (10 mg total) by mouth daily. 12/07/22  Yes Osvaldo Shipper, MD  Cholecalciferol (VITAMIN D) 50 MCG (2000 UT) CAPS Take 2,000 Units by mouth daily.   Yes [provider]  famotidine (PEPCID) 20 MG tablet Take 1 tablet (20 mg total) by mouth daily. 12/06/22  Yes Osvaldo Shipper, MD  folic acid (FOLVITE) 1 MG tablet Take 1 tablet (1 mg total) by mouth daily. 12/06/22  Yes Osvaldo Shipper, MD  loperamide (IMODIUM A-D) 2 MG tablet Take 2 mg by mouth every 6 (six) hours as needed for diarrhea or loose stools.   Yes [provider]  losartan (COZAAR) 50 MG tablet Take 50 mg by mouth daily. 01/18/23  Yes [provider]  sertraline (ZOLOFT) 100 MG tablet Take 100 mg by mouth daily.   Yes [provider]  vitamin B-12 (VITAMIN B12) 500 MCG tablet Take 1 tablet (500 mcg total) by mouth daily. 12/06/22  Yes Osvaldo Shipper, MD  polyethylene glycol (MIRALAX / GLYCOLAX) 17 g packet Take 17 g by mouth daily. 12/06/22   Osvaldo Shipper, MD  senna-docusate (SENOKOT-S) 8.6-50 MG tablet Take 2  tablets by mouth 2 (two) times daily. Patient taking differently: Take 1 tablet by mouth 2 (two) times daily. 12/06/22   Osvaldo Shipper, MD      Allergies    Patient has no known allergies.    Review of Systems   Review of Systems  Respiratory:  Positive for shortness of breath.     Physical Exam Updated Vital Signs BP (!) 166/78   Pulse 69   Temp 98 F (36.7 C) (Oral)   Resp 19   SpO2 97%  Physical Exam Vitals and nursing note reviewed.  Constitutional:      General: Catherine West is not in acute distress.    Appearance: Catherine West is not toxic-appearing.  HENT:     Head: Normocephalic.  Cardiovascular:     Rate and Rhythm: Normal rate and regular rhythm.  Pulmonary:     Comments: Mild tachypnea/increased work of breathing.  Does not appear to be in overt respiratory distress.  Speaking in full sentences.  Catherine West is saturating 98 to 91% with good waveform.  Lungs with mild wheezing. Musculoskeletal:     Cervical back: Normal range of motion.     Right lower leg: No edema.     Left lower leg: No edema.  Skin:    General: Skin is warm.     Capillary Refill: Capillary refill takes less than 2 seconds.  Neurological:  Mental Status: Catherine West is alert and oriented to person, place, and time.  Psychiatric:        Mood and Affect: Mood normal.        Behavior: Behavior normal.     ED Results / Procedures / Treatments   Labs (all labs ordered are listed, but only abnormal results are displayed) Labs Reviewed  CBC - Abnormal; Notable for the following components:      Result Value   WBC 14.8 (*)    RBC 3.57 (*)    Hemoglobin 11.5 (*)    MCV 102.2 (*)    All other components within normal limits  BASIC METABOLIC PANEL - Abnormal; Notable for the following components:   Glucose, Bld 118 (*)    BUN 28 (*)    Creatinine, Ser 1.41 (*)    Calcium 8.8 (*)    GFR, Estimated 33 (*)    All other components within normal limits  TROPONIN I (HIGH SENSITIVITY) - Abnormal; Notable for the  following components:   Troponin I (High Sensitivity) 83 (*)    All other components within normal limits  TROPONIN I (HIGH SENSITIVITY) - Abnormal; Notable for the following components:   Troponin I (High Sensitivity) 131 (*)    All other components within normal limits  SARS CORONAVIRUS 2 BY RT PCR  EXPECTORATED SPUTUM ASSESSMENT W GRAM STAIN, RFLX TO RESP C  PHOSPHORUS  MAGNESIUM  LEGIONELLA PNEUMOPHILA SEROGP 1 UR AG  STREP PNEUMONIAE URINARY ANTIGEN    EKG EKG Interpretation Date/Time:  Thursday January 20 2023 08:02:12 EDT Ventricular Rate:  72 PR Interval:  198 QRS Duration:  148 QT Interval:  436 QTC Calculation: 478 R Axis:   -72  Text Interpretation: Sinus rhythm Atrial premature complex Left bundle branch block Confirmed by Estanislado Pandy [phone]) on 01/20/2023 8:18:32 AM  Radiology CT Angio Chest PE W and/or Wo Contrast  Result Date: 01/20/2023 CLINICAL DATA:  Pulmonary embolism (PE) suspected, high prob. Shortness of breath. Low oxygen saturation. No chest pain. EXAM: CT ANGIOGRAPHY CHEST WITH CONTRAST TECHNIQUE: Multidetector CT imaging of the chest was performed using the standard protocol during bolus administration of intravenous contrast. Multiplanar CT image reconstructions and MIPs were obtained to evaluate the vascular anatomy. RADIATION DOSE REDUCTION: This exam was performed according to the departmental dose-optimization program which includes automated exposure control, adjustment of the mA and/or kV according to patient size and/or use of iterative reconstruction technique. CONTRAST:  60mL OMNIPAQUE IOHEXOL 350 MG/ML SOLN COMPARISON:  CT scan chest from 10/14/2013. FINDINGS: Cardiovascular: Evaluation of pulmonary embolism beyond the segmental branches is limited due to patient's respiratory motion and streak artifacts from contrast within the superior vena cava. There is no embolism to the segmental pulmonary artery level. Normal cardiac size. No pericardial  effusion. No aortic aneurysm. There are coronary artery calcifications, in keeping with coronary artery disease. There are also moderate to severe peripheral atherosclerotic vascular calcifications of thoracic aorta and its major branches. There is a subcentimeter sized intraluminal thrombus/noncalcified plaque in the descending thoracic arch, present since the prior study from 2015. Mediastinum/Nodes: Visualized thyroid gland appears grossly unremarkable. No solid / cystic mediastinal masses. The esophagus is nondistended precluding optimal assessment. No axillary, mediastinal or hilar lymphadenopathy by size criteria. Lungs/Pleura: The central tracheo-bronchial tree is patent. There is mild, smooth, circumferential thickening of the segmental and subsegmental bronchial walls, throughout bilateral lungs, which is nonspecific. Findings are most commonly seen with bronchitis or reactive airway disease, such as asthma. There is small  left and small-to-moderate right pleural effusion with associated compressive atelectatic changes in the bilateral lower lobes. There are additional patchy ground-glass changes in the posterior segment of right upper lobe as well as bilateral lower lobes, posteroinferiorly. There are small groupings of tree-in-bud configuration nodules in the middle lobe and left lung upper lobe. There is subsegmental atelectasis in the middle lobe as well. No suspicious mass. Upper Abdomen: Visualized upper abdominal viscera within normal limits. Stable bilateral adrenal glands. Musculoskeletal: The visualized soft tissues of the chest wall are grossly unremarkable. No suspicious osseous lesions. There are mild multilevel degenerative changes in the visualized spine. Redemonstration of moderate anterior wedging deformity of T4 vertebral body, similar to the prior study from year 2015. There is moderate to marked compression deformity of L1 vertebral body, similar to the prior study from 12/03/2022. No  significant retropulsion or spinal canal compromise. Review of the MIP images confirms the above findings. IMPRESSION: 1. No pulmonary embolism to the segmental pulmonary artery level. 2. Small left and small-to-moderate right pleural effusion with associated compressive atelectatic changes in the bilateral lower lobes. 3. Patchy ground-glass changes in the posterior segment of right upper lobe as well as bilateral lower lobes, posteroinferiorly. Small groupings of tree-in-bud configuration nodules in the middle lobe and left lung upper lobe. Findings are nonspecific but may be infectious or inflammatory in etiology. 4. Mild, smooth, circumferential thickening of the segmental and subsegmental bronchial walls, throughout bilateral lungs, which is nonspecific. Findings are most commonly seen with bronchitis or reactive airway disease, such as asthma. 5. Multiple other nonacute observations, as described above. Aortic Atherosclerosis (ICD10-I70.0). Electronically Signed   By: Jules Schick M.D.   On: 01/20/2023 11:13   DG Chest Port 1 View  Result Date: 01/20/2023 CLINICAL DATA:  Shortness of breath. EXAM: PORTABLE CHEST 1 VIEW COMPARISON:  December 02, 2022. FINDINGS: Stable cardiomediastinal silhouette. Minimal bibasilar subsegmental atelectasis or scarring is noted with small pleural effusions. Bony thorax is unremarkable. IMPRESSION: Minimal bibasilar subsegmental atelectasis or scarring with small pleural effusions. Electronically Signed   By: Lupita Raider M.D.   On: 01/20/2023 08:33    Procedures Procedures    Medications Ordered in ED Medications  cefTRIAXone (ROCEPHIN) 2 g in sodium chloride 0.9 % 100 mL IVPB (2 g Intravenous New Bag/Given 01/20/23 1452)  azithromycin (ZITHROMAX) 500 mg in sodium chloride 0.9 % 250 mL IVPB (has no administration in time range)  budesonide (PULMICORT) nebulizer solution 0.5 mg (0.5 mg Nebulization Not Given 01/20/23 1413)  enoxaparin (LOVENOX) injection 30 mg (has  no administration in time range)  dextromethorphan-guaiFENesin (MUCINEX DM) 30-600 MG per 12 hr tablet 1 tablet (1 tablet Oral Given 01/20/23 1450)  ipratropium-albuterol (DUONEB) 0.5-2.5 (3) MG/3ML nebulizer solution 3 mL (has no administration in time range)  0.9 %  sodium chloride infusion (has no administration in time range)  acetaminophen (TYLENOL) tablet 650 mg (has no administration in time range)    Or  acetaminophen (TYLENOL) suppository 650 mg (has no administration in time range)  ondansetron (ZOFRAN) tablet 4 mg (has no administration in time range)    Or  ondansetron (ZOFRAN) injection 4 mg (has no administration in time range)  sertraline (ZOLOFT) tablet 100 mg (100 mg Oral Given 01/20/23 1448)  senna-docusate (Senokot-S) tablet 1 tablet (1 tablet Oral Given 01/20/23 1450)  polyethylene glycol (MIRALAX / GLYCOLAX) packet 17 g (has no administration in time range)  folic acid (FOLVITE) tablet 1 mg (1 mg Oral Given 01/20/23 1450)  famotidine (PEPCID)  tablet 20 mg (has no administration in time range)  amLODipine (NORVASC) tablet 10 mg (10 mg Oral Given 01/20/23 1451)  lactated ringers bolus 500 mL (0 mLs Intravenous Stopped 01/20/23 1137)  amLODipine (NORVASC) tablet 10 mg (10 mg Oral Given 01/20/23 0938)  iohexol (OMNIPAQUE) 350 MG/ML injection 60 mL (60 mLs Intravenous Contrast Given 01/20/23 1012)  cefTRIAXone (ROCEPHIN) 1 g in sodium chloride 0.9 % 100 mL IVPB (0 g Intravenous Stopped 01/20/23 1148)  azithromycin (ZITHROMAX) 500 mg in sodium chloride 0.9 % 250 mL IVPB (500 mg Intravenous New Bag/Given 01/20/23 1147)    ED Course/ Medical Decision Making/ A&P Clinical Course as of 01/20/23 1515  Thu Jan 20, 2023  0802 Per chart review admitted 8/1: "RLL opacity on CXR, leukocytosis/acute respiratory failure with hypoxia WBC improved.  Respiratory status is stable.  Still requiring 2 L of oxygen via nasal cannula. Patient was changed over to oral antibiotics. Seen by speech  therapy. Hopefully oxygen can be weaned off once pneumonia has been treated.   Superior and inferior left pubic rami fractures Question of left acetabulum fracture not borne out in subsequent CT. Dr. Linna Caprice consulted > WBAT, 4wk f/u in office. No surgery.  Pain control.  Bowel regimen.   L1 compression fracture Dr. Conchita Paris consulted > no intervention necessary.    Chronic HFrEF/essential hypertension Looks like patient is no longer on losartan which will be discontinued.  Continue with amlodipine. Also looks like Catherine West is no longer on furosemide. Elevated blood pressure readings noted.  Will increase the dose of amlodipine.   Chronic kidney disease stage IIIb Stable creatinine.  Monitor urine output.  Avoid nephrotoxic agents.   Macrocytic anemia/folic acid deficiency Drop in hemoglobin is likely dilutional.  No evidence of overt bleeding.  Hemoglobin is stable.  TSH is normal at 3.8.  Anemia panel does show folic acid deficiency and low normal vitamin B12 levels.  These will be supplemented.   " [TY]  0803 Most recent echo: "1. Left ventricular ejection fraction, by estimation, is 40 to 45%. The  left ventricle has mildly decreased function. The left ventricle  demonstrates global hypokinesis. There is mild left ventricular  hypertrophy. Left ventricular diastolic parameters  " [TY]  1514 Patient's workup with negative chest x-ray.  Concern for possible PE given recent hospitalization for pelvic fractures.  CTA negative for PE, but does have pneumonia.  Does have leukocytosis and borderline oxygen requirements.  Cover with antibiotics.  Catherine West does also have increasing troponin.  Not having any active chest pain.  EKG without ST segment changes indicate ischemia.  Given patient's age and comorbid medical conditions will admit for acute pneumonia. [TY]    Clinical Course User Index [TY] Coral Spikes, DO                                 Medical Decision Making This is a 89 year old  female presenting emergency department for shortness of breath.  Afebrile, hypertensive Catherine West is also borderline hypoxic with an oxygen saturation 90 to 92% while I was in the room with good waveform.  On exam does not appear to be in overt respiratory distress, but does have some mild increased work of breathing and some faint expiratory wheezes.  Will get cardiopulmonary workup.  See ED course for further MDM and disposition.  Amount and/or Complexity of Data Reviewed Independent Historian: EMS    Details: Give DuoNeb before arrival External Data  Reviewed:     Details: See ED course Labs: ordered. Radiology: ordered. ECG/medicine tests: ordered.  Risk Prescription drug management. Decision regarding hospitalization. Diagnosis or treatment significantly limited by social determinants of health. Risk Details: Advanced age          Final Clinical Impression(s) / ED Diagnoses Final diagnoses:  Shortness of breath  Elevated troponin    Rx / DC Orders ED Discharge Orders     None         Coral Spikes, DO 01/20/23 1515

## 2023-01-20 NOTE — ED Notes (Signed)
Facility called to get an update on patient. Call back at [phone] ext [phone].

## 2023-01-20 NOTE — Assessment & Plan Note (Signed)
-  Continue heart healthy diet.

## 2023-01-20 NOTE — Assessment & Plan Note (Signed)
B12 supplementation

## 2023-01-20 NOTE — Plan of Care (Signed)
?  Problem: Education: ?Goal: Knowledge of General Education information will improve ?Description: Including pain rating scale, medication(s)/side effects and non-pharmacologic comfort measures ?Outcome: Progressing ?  ?Problem: Health Behavior/Discharge Planning: ?Goal: Ability to manage health-related needs will improve ?Outcome: Progressing ?  ?Problem: Coping: ?Goal: Level of anxiety will decrease ?Outcome: Progressing ?  ?

## 2023-01-20 NOTE — Assessment & Plan Note (Signed)
-  Continue home laxative bowel regimen -Maintain adequate hydration.

## 2023-01-20 NOTE — ED Triage Notes (Signed)
Pt arrived via [facility] from [facility] c/o sob, denies chest pain, SpO2 RA 88-90%, 2.5 albuterol given via EMS and SpO2 100% RA after Tx. Hx of HTN. Pt is A&Ox4

## 2023-01-20 NOTE — ED Notes (Signed)
O2 administered via [place] at 2lpm. O2 saturation at RA 90%

## 2023-01-20 NOTE — ED Notes (Signed)
Nurse on 300 accepted report.

## 2023-01-20 NOTE — Assessment & Plan Note (Signed)
-  Continue the use of Pepcid

## 2023-01-20 NOTE — Assessment & Plan Note (Signed)
-  Recent TSH 3.810 -Continue Synthroid.

## 2023-01-20 NOTE — ED Notes (Signed)
ED TO INPATIENT HANDOFF REPORT  ED Nurse Name and Phone #:   S Name/Age/Gender Catherine West 89 y.o. female Room/Bed: APAH2/APAH2  Code Status   Code Status: Limited: Do not attempt resuscitation (DNR) -DNR-LIMITED -Do Not Intubate/DNI   Home/SNF/Other Skilled nursing facility Patient oriented to: self, place, time, and situation Is this baseline? Yes   Triage Complete: Triage complete  Chief Complaint Acute respiratory failure with hypoxia (HCC) [J96.01]  Triage Note Pt arrived via [facility] from [facility] c/o sob, denies chest pain, SpO2 RA 88-90%, 2.5 albuterol given via EMS and SpO2 100% RA after Tx. Hx of HTN. Pt is A&Ox4   Allergies No Known Allergies  Level of Care/Admitting Diagnosis ED Disposition     ED Disposition  Admit   Condition  --   Comment  Hospital Area: [facility] [[id number]]  Level of Care: Med-Surg [16]  Covid Evaluation: Confirmed COVID Negative  Diagnosis: Acute respiratory failure with hypoxia [facility]) [[id number]]  Admitting Physician: Vassie Loll [[id number]]  Attending Physician: Vassie Loll [[id number]]          B Medical/Surgery History Past Medical History:  Diagnosis Date   Acute bronchitis    Acute bronchitis    Chronic kidney disease, stage 2 (mild)    Chronic kidney disease, unspecified    Cough    Essential (primary) hypertension    HTN (hypertension)    Hyperlipidemia, unspecified    Hypertensive chronic kidney disease with stage 1 through stage 4 chronic kidney disease, or unspecified chronic kidney disease    Hypothyroidism, unspecified    Major depressive disorder, single episode, unspecified    Osteoporosis    Osteoporosis    Pneumonia    Pneumonia    Urinary tract infection    Urinary tract infection    Past Surgical History:  Procedure Laterality Date   LESION REMOVAL Left 11/10/2022   Procedure: LESION REMOVAL TRUNK;  Surgeon: [name], MD;  Location: [facility];  Service: General;  Laterality:  Left;  left lower trunk     A IV Location/Drains/Wounds Patient Lines/Drains/Airways Status     Active Line/Drains/Airways     Name Placement date Placement time Site Days   Peripheral IV 01/20/23 22 G 1" Left;Posterior Forearm 01/20/23  0801  Forearm  less than 1   Peripheral IV 01/20/23 20 G 1" Anterior;Proximal;Right Forearm 01/20/23  0938  Forearm  less than 1            Intake/Output Last 24 hours  Intake/Output Summary (Last 24 hours) at 01/20/2023 1826 Last data filed at 01/20/2023 1547 Gross per 24 hour  Intake 100 ml  Output --  Net 100 ml    Labs/Imaging Results for orders placed or performed during the hospital encounter of 01/20/23 (from the past 48 hour(s))  Troponin I (High Sensitivity)     Status: Abnormal   Collection Time: 01/20/23  8:18 AM  Result Value Ref Range   Troponin I (High Sensitivity) 83 (H) <18 ng/L    Comment: (NOTE) Elevated high sensitivity troponin I (hsTnI) values and significant  changes across serial measurements may suggest ACS but many other  chronic and acute conditions are known to elevate hsTnI results.  Refer to the "Links" section for chest pain algorithms and additional  guidance. Performed at [facility], [address], [place], [place] [address]   CBC     Status: Abnormal   Collection Time: 01/20/23  8:18 AM  Result Value Ref Range   WBC 14.8 (H)  4.0 - 10.5 K/uL   RBC 3.57 (L) 3.87 - 5.11 MIL/uL   Hemoglobin 11.5 (L) 12.0 - 15.0 g/dL   HCT 84.6 96.2 - 95.2 %   MCV 102.2 (H) 80.0 - 100.0 fL   MCH 32.2 26.0 - 34.0 pg   MCHC 31.5 30.0 - 36.0 g/dL   RDW 84.1 32.4 - 40.1 %   Platelets 178 150 - 400 K/uL   nRBC 0.0 0.0 - 0.2 %    Comment: Performed at [facility], [address], [place], [place] [address]  Basic metabolic panel     Status: Abnormal   Collection Time: 01/20/23  8:18 AM  Result Value Ref Range   Sodium 140 135 - 145 mmol/L   Potassium 3.9 3.5 - 5.1 mmol/L   Chloride 106 98 - 111 mmol/L   CO2 22 22 -  32 mmol/L   Glucose, Bld 118 (H) 70 - 99 mg/dL    Comment: Glucose reference range applies only to samples taken after fasting for at least 8 hours.   BUN 28 (H) 8 - 23 mg/dL   Creatinine, Ser 3.66 (H) 0.44 - 1.00 mg/dL   Calcium 8.8 (L) 8.9 - 10.3 mg/dL   GFR, Estimated 33 (L) >60 mL/min    Comment: (NOTE) Calculated using the CKD-EPI Creatinine Equation (2021)    Anion gap 12 5 - 15    Comment: Performed at [facility], [address] [place], [place] [address]  Phosphorus     Status: None   Collection Time: 01/20/23  8:18 AM  Result Value Ref Range   Phosphorus 3.4 2.5 - 4.6 mg/dL    Comment: Performed at [facility], [address] [place], [place] [address]  Magnesium     Status: None   Collection Time: 01/20/23  8:18 AM  Result Value Ref Range   Magnesium 2.2 1.7 - 2.4 mg/dL    Comment: Performed at [facility], [address], [place], [place] [address]  Troponin I (High Sensitivity)     Status: Abnormal   Collection Time: 01/20/23  9:00 AM  Result Value Ref Range   Troponin I (High Sensitivity) 131 (HH) <18 ng/L    Comment: CRITICAL RESULT CALLED TO, READ BACK BY AND VERIFIED WITH SMALLWOOD, M AT 10:34 ON 01/20/23 BY PURDIE, J (NOTE) Elevated high sensitivity troponin I (hsTnI) values and significant  changes across serial measurements may suggest ACS but many other  chronic and acute conditions are known to elevate hsTnI results.  Refer to the "Links" section for chest pain algorithms and additional  guidance. Performed at [facility], [address], [place], [place] [address]   SARS Coronavirus 2 by RT PCR (hospital order, performed in [facility] hospital lab) *cepheid single result test* Anterior Nasal Swab     Status: None   Collection Time: 01/20/23 12:34 PM   Specimen: Anterior Nasal Swab  Result Value Ref Range   SARS Coronavirus 2 by RT PCR NEGATIVE NEGATIVE    Comment: (NOTE) SARS-CoV-2 target nucleic acids are NOT DETECTED.  The SARS-CoV-2 RNA is  generally detectable in upper and lower respiratory specimens during the acute phase of infection. The lowest concentration of SARS-CoV-2 viral copies this assay can detect is 250 copies / mL. A negative result does not preclude SARS-CoV-2 infection and should not be used as the sole basis for treatment or other patient management decisions.  A negative result may occur with improper specimen collection / handling, submission of specimen other than nasopharyngeal  swab, presence of viral mutation(s) within the areas targeted by this assay, and inadequate number of viral copies (<250 copies / mL). A negative result must be combined with clinical observations, patient history, and epidemiological information.  Fact Sheet for Patients:   RoadLapTop.co.za  Fact Sheet for Healthcare Providers: http://kim-miller.com/  This test is not yet approved or  cleared by the [place] FDA and has been authorized for detection and/or diagnosis of SARS-CoV-2 by FDA under an Emergency Use Authorization (EUA).  This EUA will remain in effect (meaning this test can be used) for the duration of the COVID-19 declaration under Section 564(b)(1) of the Act, 21 U.S.C. section 360bbb-3(b)(1), unless the authorization is terminated or revoked sooner.  Performed at [facility], [address] [place], [place] [address]    CT Angio Chest PE W and/or Wo Contrast  Result Date: 01/20/2023 CLINICAL DATA:  Pulmonary embolism (PE) suspected, high prob. Shortness of breath. Low oxygen saturation. No chest pain. EXAM: CT ANGIOGRAPHY CHEST WITH CONTRAST TECHNIQUE: Multidetector CT imaging of the chest was performed using the standard protocol during bolus administration of intravenous contrast. Multiplanar CT image reconstructions and MIPs were obtained to evaluate the vascular anatomy. RADIATION DOSE REDUCTION: This exam was performed according to the departmental  dose-optimization program which includes automated exposure control, adjustment of the mA and/or kV according to patient size and/or use of iterative reconstruction technique. CONTRAST:  60mL OMNIPAQUE IOHEXOL 350 MG/ML SOLN COMPARISON:  CT scan chest from 10/14/2013. FINDINGS: Cardiovascular: Evaluation of pulmonary embolism beyond the segmental branches is limited due to patient's respiratory motion and streak artifacts from contrast within the superior vena cava. There is no embolism to the segmental pulmonary artery level. Normal cardiac size. No pericardial effusion. No aortic aneurysm. There are coronary artery calcifications, in keeping with coronary artery disease. There are also moderate to severe peripheral atherosclerotic vascular calcifications of thoracic aorta and its major branches. There is a subcentimeter sized intraluminal thrombus/noncalcified plaque in the descending thoracic arch, present since the prior study from 2015. Mediastinum/Nodes: Visualized thyroid gland appears grossly unremarkable. No solid / cystic mediastinal masses. The esophagus is nondistended precluding optimal assessment. No axillary, mediastinal or hilar lymphadenopathy by size criteria. Lungs/Pleura: The central tracheo-bronchial tree is patent. There is mild, smooth, circumferential thickening of the segmental and subsegmental bronchial walls, throughout bilateral lungs, which is nonspecific. Findings are most commonly seen with bronchitis or reactive airway disease, such as asthma. There is small left and small-to-moderate right pleural effusion with associated compressive atelectatic changes in the bilateral lower lobes. There are additional patchy ground-glass changes in the posterior segment of right upper lobe as well as bilateral lower lobes, posteroinferiorly. There are small groupings of tree-in-bud configuration nodules in the middle lobe and left lung upper lobe. There is subsegmental atelectasis in the middle  lobe as well. No suspicious mass. Upper Abdomen: Visualized upper abdominal viscera within normal limits. Stable bilateral adrenal glands. Musculoskeletal: The visualized soft tissues of the chest wall are grossly unremarkable. No suspicious osseous lesions. There are mild multilevel degenerative changes in the visualized spine. Redemonstration of moderate anterior wedging deformity of T4 vertebral body, similar to the prior study from year 2015. There is moderate to marked compression deformity of L1 vertebral body, similar to the prior study from 12/03/2022. No significant retropulsion or spinal canal compromise. Review of the MIP images confirms the above findings. IMPRESSION: 1. No pulmonary embolism to the segmental pulmonary artery level. 2. Small left and small-to-moderate right pleural effusion with  associated compressive atelectatic changes in the bilateral lower lobes. 3. Patchy ground-glass changes in the posterior segment of right upper lobe as well as bilateral lower lobes, posteroinferiorly. Small groupings of tree-in-bud configuration nodules in the middle lobe and left lung upper lobe. Findings are nonspecific but may be infectious or inflammatory in etiology. 4. Mild, smooth, circumferential thickening of the segmental and subsegmental bronchial walls, throughout bilateral lungs, which is nonspecific. Findings are most commonly seen with bronchitis or reactive airway disease, such as asthma. 5. Multiple other nonacute observations, as described above. Aortic Atherosclerosis (ICD10-I70.0). Electronically Signed   By: Jules Schick M.D.   On: 01/20/2023 11:13   DG Chest Port 1 View  Result Date: 01/20/2023 CLINICAL DATA:  Shortness of breath. EXAM: PORTABLE CHEST 1 VIEW COMPARISON:  December 02, 2022. FINDINGS: Stable cardiomediastinal silhouette. Minimal bibasilar subsegmental atelectasis or scarring is noted with small pleural effusions. Bony thorax is unremarkable. IMPRESSION: Minimal bibasilar  subsegmental atelectasis or scarring with small pleural effusions. Electronically Signed   By: Lupita Raider M.D.   On: 01/20/2023 08:33    Pending Labs Unresulted Labs (From admission, onward)     Start     Ordered   01/27/23 0500  Creatinine, serum  (enoxaparin (LOVENOX)    CrCl >/= 30 ml/min)  Weekly,   R     Comments: while on enoxaparin therapy    01/20/23 1304   01/21/23 0500  CBC  Tomorrow morning,   R        01/20/23 1304   01/21/23 0500  Basic metabolic panel  Tomorrow morning,   R        01/20/23 1304   01/20/23 1304  Expectorated Sputum Assessment w Gram Stain, Rflx to Resp Cult  Once,   R        01/20/23 1304   01/20/23 1249  Strep pneumoniae urinary antigen  (COPD / Pneumonia / Cellulitis / Lower Extremity Wound)  Once,   R        01/20/23 1304            Vitals/Pain Today's Vitals   01/20/23 1330 01/20/23 1345 01/20/23 1400 01/20/23 1714  BP: (!) 138/107 (!) 167/151 (!) 166/78 (!) 144/74  Pulse: 71 69  66  Resp: (!) 22 (!) 21 19 16   Temp:    98 F (36.7 C)  TempSrc:    Oral  SpO2: 97% 97%  92%  PainSc:    0-No pain    Isolation Precautions Airborne and Contact precautions  Medications Medications  cefTRIAXone (ROCEPHIN) 2 g in sodium chloride 0.9 % 100 mL IVPB (0 g Intravenous Stopped 01/20/23 1547)  azithromycin (ZITHROMAX) 500 mg in sodium chloride 0.9 % 250 mL IVPB (0 mg Intravenous Stopped 01/20/23 1656)  budesonide (PULMICORT) nebulizer solution 0.5 mg (0.5 mg Nebulization Not Given 01/20/23 1413)  enoxaparin (LOVENOX) injection 30 mg (has no administration in time range)  dextromethorphan-guaiFENesin (MUCINEX DM) 30-600 MG per 12 hr tablet 1 tablet (1 tablet Oral Given 01/20/23 1450)  ipratropium-albuterol (DUONEB) 0.5-2.5 (3) MG/3ML nebulizer solution 3 mL (has no administration in time range)  0.9 %  sodium chloride infusion (has no administration in time range)  acetaminophen (TYLENOL) tablet 650 mg (has no administration in time range)    Or   acetaminophen (TYLENOL) suppository 650 mg (has no administration in time range)  ondansetron (ZOFRAN) tablet 4 mg (has no administration in time range)    Or  ondansetron (ZOFRAN) injection 4 mg (has no administration  in time range)  sertraline (ZOLOFT) tablet 100 mg (100 mg Oral Given 01/20/23 1448)  senna-docusate (Senokot-S) tablet 1 tablet (1 tablet Oral Given 01/20/23 1450)  polyethylene glycol (MIRALAX / GLYCOLAX) packet 17 g (has no administration in time range)  folic acid (FOLVITE) tablet 1 mg (1 mg Oral Given 01/20/23 1450)  famotidine (PEPCID) tablet 20 mg (has no administration in time range)  amLODipine (NORVASC) tablet 10 mg (10 mg Oral Given 01/20/23 1451)  lactated ringers bolus 500 mL (0 mLs Intravenous Stopped 01/20/23 1137)  amLODipine (NORVASC) tablet 10 mg (10 mg Oral Given 01/20/23 0938)  iohexol (OMNIPAQUE) 350 MG/ML injection 60 mL (60 mLs Intravenous Contrast Given 01/20/23 1012)  cefTRIAXone (ROCEPHIN) 1 g in sodium chloride 0.9 % 100 mL IVPB (0 g Intravenous Stopped 01/20/23 1148)  azithromycin (ZITHROMAX) 500 mg in sodium chloride 0.9 % 250 mL IVPB (0 mg Intravenous Stopped 01/20/23 1655)    Mobility walks with device     Focused Assessments    R Recommendations: See Admitting Provider Note  Report given to:   Additional Notes:

## 2023-01-21 ENCOUNTER — Observation Stay (HOSPITAL_COMMUNITY): Payer: Medicare Other

## 2023-01-21 DIAGNOSIS — E785 Hyperlipidemia, unspecified: Secondary | ICD-10-CM | POA: Diagnosis present

## 2023-01-21 DIAGNOSIS — S32592D Other specified fracture of left pubis, subsequent encounter for fracture with routine healing: Secondary | ICD-10-CM | POA: Diagnosis not present

## 2023-01-21 DIAGNOSIS — E860 Dehydration: Secondary | ICD-10-CM | POA: Diagnosis present

## 2023-01-21 DIAGNOSIS — Z66 Do not resuscitate: Secondary | ICD-10-CM | POA: Diagnosis present

## 2023-01-21 DIAGNOSIS — R7989 Other specified abnormal findings of blood chemistry: Secondary | ICD-10-CM | POA: Diagnosis not present

## 2023-01-21 DIAGNOSIS — M4856XA Collapsed vertebra, not elsewhere classified, lumbar region, initial encounter for fracture: Secondary | ICD-10-CM | POA: Diagnosis present

## 2023-01-21 DIAGNOSIS — R5381 Other malaise: Secondary | ICD-10-CM | POA: Diagnosis present

## 2023-01-21 DIAGNOSIS — N179 Acute kidney failure, unspecified: Secondary | ICD-10-CM | POA: Diagnosis present

## 2023-01-21 DIAGNOSIS — I447 Left bundle-branch block, unspecified: Secondary | ICD-10-CM | POA: Diagnosis present

## 2023-01-21 DIAGNOSIS — Z825 Family history of asthma and other chronic lower respiratory diseases: Secondary | ICD-10-CM | POA: Diagnosis not present

## 2023-01-21 DIAGNOSIS — I5042 Chronic combined systolic (congestive) and diastolic (congestive) heart failure: Secondary | ICD-10-CM | POA: Diagnosis present

## 2023-01-21 DIAGNOSIS — E039 Hypothyroidism, unspecified: Secondary | ICD-10-CM | POA: Diagnosis present

## 2023-01-21 DIAGNOSIS — Z79899 Other long term (current) drug therapy: Secondary | ICD-10-CM | POA: Diagnosis not present

## 2023-01-21 DIAGNOSIS — R0602 Shortness of breath: Secondary | ICD-10-CM | POA: Diagnosis not present

## 2023-01-21 DIAGNOSIS — M810 Age-related osteoporosis without current pathological fracture: Secondary | ICD-10-CM | POA: Diagnosis present

## 2023-01-21 DIAGNOSIS — I2489 Other forms of acute ischemic heart disease: Secondary | ICD-10-CM | POA: Diagnosis present

## 2023-01-21 DIAGNOSIS — N1832 Chronic kidney disease, stage 3b: Secondary | ICD-10-CM | POA: Diagnosis present

## 2023-01-21 DIAGNOSIS — Z1152 Encounter for screening for COVID-19: Secondary | ICD-10-CM | POA: Diagnosis not present

## 2023-01-21 DIAGNOSIS — K219 Gastro-esophageal reflux disease without esophagitis: Secondary | ICD-10-CM | POA: Diagnosis present

## 2023-01-21 DIAGNOSIS — I130 Hypertensive heart and chronic kidney disease with heart failure and stage 1 through stage 4 chronic kidney disease, or unspecified chronic kidney disease: Secondary | ICD-10-CM | POA: Diagnosis present

## 2023-01-21 DIAGNOSIS — D519 Vitamin B12 deficiency anemia, unspecified: Secondary | ICD-10-CM | POA: Diagnosis present

## 2023-01-21 DIAGNOSIS — D529 Folate deficiency anemia, unspecified: Secondary | ICD-10-CM | POA: Diagnosis present

## 2023-01-21 DIAGNOSIS — K5909 Other constipation: Secondary | ICD-10-CM | POA: Diagnosis present

## 2023-01-21 DIAGNOSIS — J189 Pneumonia, unspecified organism: Secondary | ICD-10-CM | POA: Diagnosis present

## 2023-01-21 DIAGNOSIS — J9601 Acute respiratory failure with hypoxia: Secondary | ICD-10-CM | POA: Diagnosis present

## 2023-01-21 DIAGNOSIS — I272 Pulmonary hypertension, unspecified: Secondary | ICD-10-CM | POA: Diagnosis present

## 2023-01-21 LAB — GLUCOSE, CAPILLARY: Glucose-Capillary: 105 mg/dL — ABNORMAL HIGH (ref 70–99)

## 2023-01-21 LAB — CBC
HCT: 30.9 % — ABNORMAL LOW (ref 36.0–46.0)
Hemoglobin: 9.6 g/dL — ABNORMAL LOW (ref 12.0–15.0)
MCH: 32.4 pg (ref 26.0–34.0)
MCHC: 31.1 g/dL (ref 30.0–36.0)
MCV: 104.4 fL — ABNORMAL HIGH (ref 80.0–100.0)
Platelets: 144 10*3/uL — ABNORMAL LOW (ref 150–400)
RBC: 2.96 MIL/uL — ABNORMAL LOW (ref 3.87–5.11)
RDW: 14.9 % (ref 11.5–15.5)
WBC: 7.3 10*3/uL (ref 4.0–10.5)
nRBC: 0 % (ref 0.0–0.2)

## 2023-01-21 LAB — BASIC METABOLIC PANEL
Anion gap: 10 (ref 5–15)
BUN: 24 mg/dL — ABNORMAL HIGH (ref 8–23)
CO2: 23 mmol/L (ref 22–32)
Calcium: 8.3 mg/dL — ABNORMAL LOW (ref 8.9–10.3)
Chloride: 106 mmol/L (ref 98–111)
Creatinine, Ser: 1.2 mg/dL — ABNORMAL HIGH (ref 0.44–1.00)
GFR, Estimated: 40 mL/min — ABNORMAL LOW (ref >60)
Glucose, Bld: 91 mg/dL (ref 70–99)
Potassium: 3.8 mmol/L (ref 3.5–5.1)
Sodium: 139 mmol/L (ref 135–145)

## 2023-01-21 LAB — ECHOCARDIOGRAM COMPLETE
AR max vel: 1.82 cm2
AV Peak grad: 7.8 mmHg
Ao pk vel: 1.4 m/s
Area-P 1/2: 3.77 cm2
Calc EF: 37 %
MV M vel: 5.49 m/s
MV Peak grad: 120.7 mmHg
S' Lateral: 3.2 cm
Single Plane A2C EF: 27.4 %
Single Plane A4C EF: 45.2 %

## 2023-01-21 LAB — TROPONIN I (HIGH SENSITIVITY): Troponin I (High Sensitivity): 130 ng/L (ref <18)

## 2023-01-21 NOTE — TOC Initial Note (Signed)
Transition of Care [facility]) - Initial/Assessment Note    Patient Details  Name: Catherine West MRN: [id number] Date of Birth: 11/11/1920  Transition of Care [facility] CM/SW Contact:    Villa Herb, LCSWA Phone Number: 01/21/2023, 11:58 AM  Clinical Narrative:                 Pt admitted from [facility]F. CSW spoke to [place] who states that pt can D/C back to facility when medically stable and it can be over the weekend if needed. If pt needs home O2 set up prior to D/C facility will need O2 delivered prior to pts return. TOC to follow.   Expected Discharge Plan: Assisted Living Barriers to Discharge: Continued Medical Work up   Patient Goals and CMS Choice Patient states their goals for this hospitalization and ongoing recovery are:: return to ALF CMS Medicare.gov Compare Post Acute Care list provided to:: Patient Choice offered to / list presented to : Patient      Expected Discharge Plan and Services In-house Referral: Clinical Social Work Discharge Planning Services: CM Consult Post Acute Care Choice: Resumption of Svcs/PTA Provider Living arrangements for the past 2 months: Assisted Living Facility                                      Prior Living Arrangements/Services Living arrangements for the past 2 months: Assisted Living Facility Lives with:: Facility Resident Patient language and need for interpreter reviewed:: Yes Do you feel safe going back to the place where you live?: Yes      Need for Family Participation in Patient Care: Yes (Comment) Care giver support system in place?: Yes (comment)   Criminal Activity/Legal Involvement Pertinent to Current Situation/Hospitalization: No - Comment as needed  Activities of Daily Living   ADL Screening (condition at time of admission) Patient's cognitive ability adequate to safely complete daily activities?: Yes Is the patient deaf or have difficulty hearing?: Yes Does the patient have difficulty  seeing, even when wearing glasses/contacts?: No Does the patient have difficulty concentrating, remembering, or making decisions?: No Patient able to express need for assistance with ADLs?: Yes Does the patient have difficulty dressing or bathing?: No Independently performs ADLs?: Yes (appropriate for developmental age) Does the patient have difficulty walking or climbing stairs?: Yes Weakness of Legs: Both Weakness of Arms/Hands: None  Permission Sought/Granted                  Emotional Assessment Appearance:: Appears stated age Attitude/Demeanor/Rapport: Engaged Affect (typically observed): Accepting   Alcohol / Substance Use: Not Applicable Psych Involvement: No (comment)  Admission diagnosis:  Shortness of breath [R06.02] Elevated troponin [R79.89] Acute respiratory failure with hypoxia (HCC) [J96.01] Patient Active Problem List   Diagnosis Date Noted   Acute respiratory failure with hypoxia (HCC) 01/20/2023   Acute renal failure superimposed on stage 3b chronic kidney disease (HCC) 01/20/2023   Right lower lobe pneumonia 12/09/2022   Pubic ramus fracture, left, sequela 12/08/2022   Benign hypertension with stage 3b chronic kidney disease (HCC) 12/08/2022   Chronic constipation 12/08/2022   Macrocytic anemia 12/08/2022   Folate deficiency 12/08/2022   Vitamin B 12 deficiency 12/08/2022   GERD without esophagitis 12/08/2022   Aortic atherosclerosis (HCC) 12/08/2022   Chronic HFrEF (heart failure with reduced ejection fraction) (HCC) 12/08/2022   Stage 3b chronic kidney disease (HCC) 12/08/2022   Compression fracture of L1  lumbar vertebra (HCC) 12/03/2022   Leukocytosis 12/03/2022   Basal cell carcinoma (BCC) of abdomen 11/10/2022   Acute CHF (HCC) 09/04/2022   Essential (primary) hypertension    Hyperlipidemia, unspecified    Major depressive disorder, single episode, unspecified    Hypothyroidism, unspecified    Osteoporosis    Gait abnormality 02/22/2019    Falls frequently 02/22/2019   PCP:  Benita Stabile, MD Pharmacy:   [facility] [place] - [address] [place] [address] Phone: [phone] Fax: [phone]  [facility] [place], [place] - [place] [address] [place] [address] Phone: [phone] Fax: [phone]     Social Determinants of Health (SDOH) Social History: SDOH Screenings   Food Insecurity: No Food Insecurity (01/21/2023)  Housing: Low Risk  (01/21/2023)  Transportation Needs: No Transportation Needs (01/21/2023)  Utilities: Not At Risk (01/21/2023)  Tobacco Use: Low Risk  (12/23/2022)   SDOH Interventions:     Readmission Risk Interventions     No data to display

## 2023-01-21 NOTE — Evaluation (Signed)
Clinical/Bedside Swallow Evaluation Patient Details  Name: Catherine West MRN: [id number] Date of Birth: 1920/10/29  Today's Date: 01/21/2023 Time: SLP Start Time (ACUTE ONLY): 1245 SLP Stop Time (ACUTE ONLY): 1305 SLP Time Calculation (min) (ACUTE ONLY): 20 min  Past Medical History:  Past Medical History:  Diagnosis Date   Acute bronchitis    Acute bronchitis    Chronic kidney disease, stage 2 (mild)    Chronic kidney disease, unspecified    Cough    Essential (primary) hypertension    HTN (hypertension)    Hyperlipidemia, unspecified    Hypertensive chronic kidney disease with stage 1 through stage 4 chronic kidney disease, or unspecified chronic kidney disease    Hypothyroidism, unspecified    Major depressive disorder, single episode, unspecified    Osteoporosis    Osteoporosis    Pneumonia    Pneumonia    Urinary tract infection    Urinary tract infection    Past Surgical History:  Past Surgical History:  Procedure Laterality Date   LESION REMOVAL Left 11/10/2022   Procedure: LESION REMOVAL TRUNK;  Surgeon: [name], MD;  Location: [facility];  Service: General;  Laterality: Left;  left lower trunk   HPI:  Catherine West is a 89 y.o. female with medical history significant of hypertension, hyperlipidemia, chronic kidney disease stage IIIb, hypothyroidism and gastroesophageal reflux disease; who presented to the hospital from assisted living facility secondary to increased coughing spells and shortness of breath.  Patient found to be hypoxic on room air. BSE requested.   Assessment / Plan / Recommendation  Clinical Impression  Clinical swallowing evaluation completed while Pt was sitting upright in bed. Pt reports she "gets strangled" when she uses straws or goes to fast with sips of liquid. Pt consumed all textures and consistencies without overt s/sx of oropharyngeal dysphagia. Recommend initiate regular diet and thin liquids. Recommend NO STRAWS and slow rate; meds  are ok whole with liquids. There are no further ST needs noted at this time, our service will sign off. Thank you for this referral,  SLP Visit Diagnosis: Dysphagia, oropharyngeal phase (R13.12)    Aspiration Risk  No limitations    Diet Recommendation Regular;Thin liquid    Liquid Administration via: Cup;No straw Medication Administration: Whole meds with liquid Supervision: Patient able to self feed Compensations: Minimize environmental distractions Postural Changes: Seated upright at 90 degrees    Other  Recommendations Oral Care Recommendations: Oral care BID    Recommendations for follow up therapy are one component of a multi-disciplinary discharge planning process, led by the attending physician.  Recommendations may be updated based on patient status, additional functional criteria and insurance authorization.  Follow up Recommendations No SLP follow up        Swallow Study   General Date of Onset: 01/20/23 HPI: Catherine West is a 89 y.o. female with medical history significant of hypertension, hyperlipidemia, chronic kidney disease stage IIIb, hypothyroidism and gastroesophageal reflux disease; who presented to the hospital from assisted living facility secondary to increased coughing spells and shortness of breath.  Patient found to be hypoxic on room air. Type of Study: Bedside Swallow Evaluation Previous Swallow Assessment: BSE 12/21/22 Diet Prior to this Study: Full liquid diet Temperature Spikes Noted: No Respiratory Status: Room air History of Recent Intubation: No Behavior/Cognition: Alert;Cooperative;Pleasant mood Oral Cavity Assessment: Within Functional Limits Oral Care Completed by SLP: Recent completion by staff Oral Cavity - Dentition: Adequate natural dentition Vision: Functional for self-feeding Self-Feeding Abilities: Able to feed  self Patient Positioning: Upright in bed Baseline Vocal Quality: Normal Volitional Cough: Strong Volitional Swallow:  Able to elicit    Oral/Motor/Sensory Function Overall Oral Motor/Sensory Function: Within functional limits   Ice Chips Ice chips: Within functional limits   Thin Liquid Thin Liquid: Within functional limits    Nectar Thick Nectar Thick Liquid: Not tested   Honey Thick Honey Thick Liquid: Not tested   Puree Puree: Within functional limits   Solid     Solid: Within functional limits      Mildreth Reek H. Romie Levee, CCC-SLP Speech Language Pathologist   Georgetta Haber 01/21/2023,1:21 PM

## 2023-01-21 NOTE — Progress Notes (Signed)
Echocardiogram 2D Echocardiogram has been performed.  Catherine West 01/21/2023, 2:45 PM

## 2023-01-21 NOTE — Progress Notes (Signed)
viral copies (<250 copies / mL). A negative result must be combined with clinical observations, patient history, and epidemiological information.  Fact Sheet for Patients:   RoadLapTop.co.za  Fact Sheet for Healthcare Providers: http://kim-miller.com/  This test is not yet approved or  cleared by the [place] FDA and has been authorized for detection and/or diagnosis of SARS-CoV-2 by FDA under an Emergency Use Authorization (EUA).  This EUA will remain in effect (meaning this test can be used) for the duration of the COVID-19 declaration under Section 564(b)(1) of the Act, 21 U.S.C. section 360bbb-3(b)(1), unless the authorization is terminated or revoked sooner.  Performed at [facility], [address], [place], [place] [address]    Radiology Studies: ECHOCARDIOGRAM COMPLETE  Result Date: 01/21/2023    ECHOCARDIOGRAM REPORT   Patient Name:   Catherine West Date of Exam: 01/21/2023 Medical Rec #:  [id number]        Height:       62.0 in Accession #:    [id number]       Weight:       129.4 lb Date of Birth:  Sep 17, 1920         BSA:          1.589 m Patient Age:    89  years        BP:           165/66 mmHg Patient Gender: F                HR:           67 bpm. Exam Location:  Inpatient Procedure: 2D Echo, Color Doppler and Cardiac Doppler Indications:    Elevated Troponin  History:        Patient has prior history of Echocardiogram examinations, most                 recent 09/05/2022. CHF; Risk Factors:Hypertension. CKD, stage 3.  Sonographer:    Catherine West RCS Referring Phys: [id number] Catherine West IMPRESSIONS  1. Left ventricular ejection fraction, by estimation, is 35 to 40%. The left ventricle has moderately decreased function. Left ventricular endocardial border not optimally defined to evaluate regional wall motion but there appears to be anterolateral wall hypokinesis. Consider Limited Echo with contrast for RWMA assessment. Left ventricular diastolic parameters are consistent with Grade I diastolic dysfunction (impaired relaxation). Elevated left ventricular end-diastolic pressure.  2. Right ventricular systolic function was not well visualized. The right ventricular size is normal. There is mildly elevated pulmonary artery systolic pressure. The estimated right ventricular systolic pressure is 40.3 mmHg.  3. Moderate pleural effusion in the left lateral region.  4. The mitral valve is normal in structure. Mild mitral valve regurgitation. No evidence of mitral stenosis.  5. The aortic valve has an indeterminant number of cusps. Aortic valve regurgitation is mild. No aortic stenosis is present.  6. The inferior vena cava is dilated in size with >50% respiratory variability, suggesting right atrial pressure of 8 mmHg.  7. Cannot exclude a small PFO. Comparison(s): No significant change from prior study. FINDINGS  Left Ventricle: Left ventricular ejection fraction, by estimation, is 35 to 40%. The left ventricle has moderately decreased function. Left ventricular endocardial border not optimally defined to evaluate regional wall motion. The left ventricular internal  cavity size was normal in size. There is no left ventricular hypertrophy. Left ventricular diastolic parameters are consistent with Grade I diastolic dysfunction (impaired relaxation). Elevated left ventricular end-diastolic pressure. Right Ventricle: The right  PROGRESS NOTE   Catherine West, is a 89 y.o. female, DOB - 01/25/1921, [id number]  Admit date - 01/20/2023   Admitting Physician Catherine Olafson Mariea Clonts, MD  Outpatient Primary MD for the patient is Catherine Stabile, MD  LOS - 0  Chief Complaint  Patient presents with   Shortness of Breath       Brief Narrative:  [name] is a 89 y.o. female with medical history significant of hypertension, hyperlipidemia, chronic kidney disease stage IIIb, hypothyroidism and gastroesophageal reflux disease admitted on 01/20/2023 with acute hypoxic respiratory failure in setting of bronchitis    -Assessment and Plan:  Acute renal failure superimposed on stage 3b chronic kidney disease (HCC) renally adjust medications, avoid nephrotoxic agents / dehydration  / hypotension -Judicious fluid resuscitation (patient with underlying history of CHF). 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 -Holding diuretics -Judicious fluid resuscitation will be provided -Continue to follow daily weights and strict intake and output -Echo as above  Acute hypoxic respiratory failure--suspect due to bronchitis, weaning off oxygen nightly -Continue Rocephin/azithromycin and mucolytics as well as bronchodilators   Vitamin B 12 deficiency -B12 supplementation.   GERD without esophagitis -Continue the use of Pepcid.   Chronic constipation -Continue home laxative bowel regimen -Maintain adequate hydration.   Compression fracture of L1 lumbar vertebra (HCC) -Continue as needed analgesics.   Hypothyroidism, unspecified -Recent TSH 3.810 -Continue  Synthroid.   Hyperlipidemia, unspecified -Continue heart healthy diet.   Essential (primary) hypertension -Continue Norvasc -Follow vital signs.     Status is: Inpatient   Disposition: The patient is from: ALF              Anticipated d/c is to: ALF              Anticipated d/c date is: 1 day              Patient currently is not medically stable to d/c. Barriers: Not Clinically Stable-   Code Status :  -  Code Status: Limited: Do not attempt resuscitation (DNR) -DNR-LIMITED -Do Not Intubate/DNI    Family Communication:    (patient is alert, awake and coherent)   DVT Prophylaxis  :   - SCDs  enoxaparin (LOVENOX) injection 30 mg Start: 01/20/23 2200   Lab Results  Component Value Date   PLT 144 (L) 01/21/2023    Inpatient Medications  Scheduled Meds:  amLODipine  10 mg Oral Daily   budesonide (PULMICORT) nebulizer solution  0.5 mg Nebulization BID   dextromethorphan-guaiFENesin  1 tablet Oral BID   enoxaparin (LOVENOX) injection  30 mg Subcutaneous Q24H   famotidine  20 mg Oral QHS   folic acid  1 mg Oral Daily   senna-docusate  1 tablet Oral BID   sertraline  100 mg Oral Daily   Continuous Infusions:  azithromycin 500 mg (01/21/23 1601)   cefTRIAXone (ROCEPHIN)  IV 2 g (01/21/23 1810)   PRN Meds:.acetaminophen **OR** acetaminophen, ipratropium-albuterol, ondansetron **OR** ondansetron (ZOFRAN) IV, polyethylene glycol   Anti-infectives (From admission, onward)    Start     Dose/Rate Route Frequency Ordered Stop   01/20/23 1400  cefTRIAXone (ROCEPHIN) 2 g in sodium chloride 0.9 % 100 mL IVPB  PROGRESS NOTE   Catherine West, is a 89 y.o. female, DOB - 01/25/1921, [id number]  Admit date - 01/20/2023   Admitting Physician Catherine Olafson Mariea Clonts, MD  Outpatient Primary MD for the patient is Catherine Stabile, MD  LOS - 0  Chief Complaint  Patient presents with   Shortness of Breath       Brief Narrative:  [name] is a 89 y.o. female with medical history significant of hypertension, hyperlipidemia, chronic kidney disease stage IIIb, hypothyroidism and gastroesophageal reflux disease admitted on 01/20/2023 with acute hypoxic respiratory failure in setting of bronchitis    -Assessment and Plan:  Acute renal failure superimposed on stage 3b chronic kidney disease (HCC) renally adjust medications, avoid nephrotoxic agents / dehydration  / hypotension -Judicious fluid resuscitation (patient with underlying history of CHF). 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 -Holding diuretics -Judicious fluid resuscitation will be provided -Continue to follow daily weights and strict intake and output -Echo as above  Acute hypoxic respiratory failure--suspect due to bronchitis, weaning off oxygen nightly -Continue Rocephin/azithromycin and mucolytics as well as bronchodilators   Vitamin B 12 deficiency -B12 supplementation.   GERD without esophagitis -Continue the use of Pepcid.   Chronic constipation -Continue home laxative bowel regimen -Maintain adequate hydration.   Compression fracture of L1 lumbar vertebra (HCC) -Continue as needed analgesics.   Hypothyroidism, unspecified -Recent TSH 3.810 -Continue  Synthroid.   Hyperlipidemia, unspecified -Continue heart healthy diet.   Essential (primary) hypertension -Continue Norvasc -Follow vital signs.     Status is: Inpatient   Disposition: The patient is from: ALF              Anticipated d/c is to: ALF              Anticipated d/c date is: 1 day              Patient currently is not medically stable to d/c. Barriers: Not Clinically Stable-   Code Status :  -  Code Status: Limited: Do not attempt resuscitation (DNR) -DNR-LIMITED -Do Not Intubate/DNI    Family Communication:    (patient is alert, awake and coherent)   DVT Prophylaxis  :   - SCDs  enoxaparin (LOVENOX) injection 30 mg Start: 01/20/23 2200   Lab Results  Component Value Date   PLT 144 (L) 01/21/2023    Inpatient Medications  Scheduled Meds:  amLODipine  10 mg Oral Daily   budesonide (PULMICORT) nebulizer solution  0.5 mg Nebulization BID   dextromethorphan-guaiFENesin  1 tablet Oral BID   enoxaparin (LOVENOX) injection  30 mg Subcutaneous Q24H   famotidine  20 mg Oral QHS   folic acid  1 mg Oral Daily   senna-docusate  1 tablet Oral BID   sertraline  100 mg Oral Daily   Continuous Infusions:  azithromycin 500 mg (01/21/23 1601)   cefTRIAXone (ROCEPHIN)  IV 2 g (01/21/23 1810)   PRN Meds:.acetaminophen **OR** acetaminophen, ipratropium-albuterol, ondansetron **OR** ondansetron (ZOFRAN) IV, polyethylene glycol   Anti-infectives (From admission, onward)    Start     Dose/Rate Route Frequency Ordered Stop   01/20/23 1400  cefTRIAXone (ROCEPHIN) 2 g in sodium chloride 0.9 % 100 mL IVPB  viral copies (<250 copies / mL). A negative result must be combined with clinical observations, patient history, and epidemiological information.  Fact Sheet for Patients:   RoadLapTop.co.za  Fact Sheet for Healthcare Providers: http://kim-miller.com/  This test is not yet approved or  cleared by the [place] FDA and has been authorized for detection and/or diagnosis of SARS-CoV-2 by FDA under an Emergency Use Authorization (EUA).  This EUA will remain in effect (meaning this test can be used) for the duration of the COVID-19 declaration under Section 564(b)(1) of the Act, 21 U.S.C. section 360bbb-3(b)(1), unless the authorization is terminated or revoked sooner.  Performed at [facility], [address], [place], [place] [address]    Radiology Studies: ECHOCARDIOGRAM COMPLETE  Result Date: 01/21/2023    ECHOCARDIOGRAM REPORT   Patient Name:   Catherine West Date of Exam: 01/21/2023 Medical Rec #:  [id number]        Height:       62.0 in Accession #:    [id number]       Weight:       129.4 lb Date of Birth:  Sep 17, 1920         BSA:          1.589 m Patient Age:    89  years        BP:           165/66 mmHg Patient Gender: F                HR:           67 bpm. Exam Location:  Inpatient Procedure: 2D Echo, Color Doppler and Cardiac Doppler Indications:    Elevated Troponin  History:        Patient has prior history of Echocardiogram examinations, most                 recent 09/05/2022. CHF; Risk Factors:Hypertension. CKD, stage 3.  Sonographer:    Catherine West RCS Referring Phys: [id number] Catherine West IMPRESSIONS  1. Left ventricular ejection fraction, by estimation, is 35 to 40%. The left ventricle has moderately decreased function. Left ventricular endocardial border not optimally defined to evaluate regional wall motion but there appears to be anterolateral wall hypokinesis. Consider Limited Echo with contrast for RWMA assessment. Left ventricular diastolic parameters are consistent with Grade I diastolic dysfunction (impaired relaxation). Elevated left ventricular end-diastolic pressure.  2. Right ventricular systolic function was not well visualized. The right ventricular size is normal. There is mildly elevated pulmonary artery systolic pressure. The estimated right ventricular systolic pressure is 40.3 mmHg.  3. Moderate pleural effusion in the left lateral region.  4. The mitral valve is normal in structure. Mild mitral valve regurgitation. No evidence of mitral stenosis.  5. The aortic valve has an indeterminant number of cusps. Aortic valve regurgitation is mild. No aortic stenosis is present.  6. The inferior vena cava is dilated in size with >50% respiratory variability, suggesting right atrial pressure of 8 mmHg.  7. Cannot exclude a small PFO. Comparison(s): No significant change from prior study. FINDINGS  Left Ventricle: Left ventricular ejection fraction, by estimation, is 35 to 40%. The left ventricle has moderately decreased function. Left ventricular endocardial border not optimally defined to evaluate regional wall motion. The left ventricular internal  cavity size was normal in size. There is no left ventricular hypertrophy. Left ventricular diastolic parameters are consistent with Grade I diastolic dysfunction (impaired relaxation). Elevated left ventricular end-diastolic pressure. Right Ventricle: The right  PROGRESS NOTE   Catherine West, is a 89 y.o. female, DOB - 01/25/1921, [id number]  Admit date - 01/20/2023   Admitting Physician Catherine Olafson Mariea Clonts, MD  Outpatient Primary MD for the patient is Catherine Stabile, MD  LOS - 0  Chief Complaint  Patient presents with   Shortness of Breath       Brief Narrative:  [name] is a 89 y.o. female with medical history significant of hypertension, hyperlipidemia, chronic kidney disease stage IIIb, hypothyroidism and gastroesophageal reflux disease admitted on 01/20/2023 with acute hypoxic respiratory failure in setting of bronchitis    -Assessment and Plan:  Acute renal failure superimposed on stage 3b chronic kidney disease (HCC) renally adjust medications, avoid nephrotoxic agents / dehydration  / hypotension -Judicious fluid resuscitation (patient with underlying history of CHF). 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 -Holding diuretics -Judicious fluid resuscitation will be provided -Continue to follow daily weights and strict intake and output -Echo as above  Acute hypoxic respiratory failure--suspect due to bronchitis, weaning off oxygen nightly -Continue Rocephin/azithromycin and mucolytics as well as bronchodilators   Vitamin B 12 deficiency -B12 supplementation.   GERD without esophagitis -Continue the use of Pepcid.   Chronic constipation -Continue home laxative bowel regimen -Maintain adequate hydration.   Compression fracture of L1 lumbar vertebra (HCC) -Continue as needed analgesics.   Hypothyroidism, unspecified -Recent TSH 3.810 -Continue  Synthroid.   Hyperlipidemia, unspecified -Continue heart healthy diet.   Essential (primary) hypertension -Continue Norvasc -Follow vital signs.     Status is: Inpatient   Disposition: The patient is from: ALF              Anticipated d/c is to: ALF              Anticipated d/c date is: 1 day              Patient currently is not medically stable to d/c. Barriers: Not Clinically Stable-   Code Status :  -  Code Status: Limited: Do not attempt resuscitation (DNR) -DNR-LIMITED -Do Not Intubate/DNI    Family Communication:    (patient is alert, awake and coherent)   DVT Prophylaxis  :   - SCDs  enoxaparin (LOVENOX) injection 30 mg Start: 01/20/23 2200   Lab Results  Component Value Date   PLT 144 (L) 01/21/2023    Inpatient Medications  Scheduled Meds:  amLODipine  10 mg Oral Daily   budesonide (PULMICORT) nebulizer solution  0.5 mg Nebulization BID   dextromethorphan-guaiFENesin  1 tablet Oral BID   enoxaparin (LOVENOX) injection  30 mg Subcutaneous Q24H   famotidine  20 mg Oral QHS   folic acid  1 mg Oral Daily   senna-docusate  1 tablet Oral BID   sertraline  100 mg Oral Daily   Continuous Infusions:  azithromycin 500 mg (01/21/23 1601)   cefTRIAXone (ROCEPHIN)  IV 2 g (01/21/23 1810)   PRN Meds:.acetaminophen **OR** acetaminophen, ipratropium-albuterol, ondansetron **OR** ondansetron (ZOFRAN) IV, polyethylene glycol   Anti-infectives (From admission, onward)    Start     Dose/Rate Route Frequency Ordered Stop   01/20/23 1400  cefTRIAXone (ROCEPHIN) 2 g in sodium chloride 0.9 % 100 mL IVPB  PROGRESS NOTE   Catherine West, is a 89 y.o. female, DOB - 01/25/1921, [id number]  Admit date - 01/20/2023   Admitting Physician Catherine Olafson Mariea Clonts, MD  Outpatient Primary MD for the patient is Catherine Stabile, MD  LOS - 0  Chief Complaint  Patient presents with   Shortness of Breath       Brief Narrative:  [name] is a 89 y.o. female with medical history significant of hypertension, hyperlipidemia, chronic kidney disease stage IIIb, hypothyroidism and gastroesophageal reflux disease admitted on 01/20/2023 with acute hypoxic respiratory failure in setting of bronchitis    -Assessment and Plan:  Acute renal failure superimposed on stage 3b chronic kidney disease (HCC) renally adjust medications, avoid nephrotoxic agents / dehydration  / hypotension -Judicious fluid resuscitation (patient with underlying history of CHF). 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 -Holding diuretics -Judicious fluid resuscitation will be provided -Continue to follow daily weights and strict intake and output -Echo as above  Acute hypoxic respiratory failure--suspect due to bronchitis, weaning off oxygen nightly -Continue Rocephin/azithromycin and mucolytics as well as bronchodilators   Vitamin B 12 deficiency -B12 supplementation.   GERD without esophagitis -Continue the use of Pepcid.   Chronic constipation -Continue home laxative bowel regimen -Maintain adequate hydration.   Compression fracture of L1 lumbar vertebra (HCC) -Continue as needed analgesics.   Hypothyroidism, unspecified -Recent TSH 3.810 -Continue  Synthroid.   Hyperlipidemia, unspecified -Continue heart healthy diet.   Essential (primary) hypertension -Continue Norvasc -Follow vital signs.     Status is: Inpatient   Disposition: The patient is from: ALF              Anticipated d/c is to: ALF              Anticipated d/c date is: 1 day              Patient currently is not medically stable to d/c. Barriers: Not Clinically Stable-   Code Status :  -  Code Status: Limited: Do not attempt resuscitation (DNR) -DNR-LIMITED -Do Not Intubate/DNI    Family Communication:    (patient is alert, awake and coherent)   DVT Prophylaxis  :   - SCDs  enoxaparin (LOVENOX) injection 30 mg Start: 01/20/23 2200   Lab Results  Component Value Date   PLT 144 (L) 01/21/2023    Inpatient Medications  Scheduled Meds:  amLODipine  10 mg Oral Daily   budesonide (PULMICORT) nebulizer solution  0.5 mg Nebulization BID   dextromethorphan-guaiFENesin  1 tablet Oral BID   enoxaparin (LOVENOX) injection  30 mg Subcutaneous Q24H   famotidine  20 mg Oral QHS   folic acid  1 mg Oral Daily   senna-docusate  1 tablet Oral BID   sertraline  100 mg Oral Daily   Continuous Infusions:  azithromycin 500 mg (01/21/23 1601)   cefTRIAXone (ROCEPHIN)  IV 2 g (01/21/23 1810)   PRN Meds:.acetaminophen **OR** acetaminophen, ipratropium-albuterol, ondansetron **OR** ondansetron (ZOFRAN) IV, polyethylene glycol   Anti-infectives (From admission, onward)    Start     Dose/Rate Route Frequency Ordered Stop   01/20/23 1400  cefTRIAXone (ROCEPHIN) 2 g in sodium chloride 0.9 % 100 mL IVPB

## 2023-01-21 NOTE — Plan of Care (Signed)

## 2023-01-21 NOTE — Progress Notes (Signed)
Date and time results received: 01/21/23 0000 (use smartphrase ".now" to insert current time)  Test: Troponin Critical Value: 161  Name of Provider Notified: Dr. Thomes Dinning  Orders Received? Or Actions Taken?:  Via secure chat-"continue to monitor"

## 2023-01-22 DIAGNOSIS — J9601 Acute respiratory failure with hypoxia: Secondary | ICD-10-CM | POA: Diagnosis not present

## 2023-01-22 MED ORDER — VENTOLIN HFA 108 (90 BASE) MCG/ACT IN AERS: 2.0000 | INHALATION_SPRAY | RESPIRATORY_TRACT | 2 refills | Status: AC | PRN

## 2023-01-22 MED ORDER — GUAIFENESIN ER 600 MG PO TB12: 600.0000 mg | ORAL_TABLET | Freq: Two times a day (BID) | ORAL | 2 refills | Status: AC

## 2023-01-22 MED ORDER — DOXYCYCLINE HYCLATE 100 MG PO TABS: 100.0000 mg | ORAL_TABLET | Freq: Two times a day (BID) | ORAL | 0 refills | Status: AC

## 2023-01-22 MED ORDER — ZINC OXIDE 40 % EX OINT
TOPICAL_OINTMENT | Freq: Three times a day (TID) | CUTANEOUS | Status: DC
  Filled 2023-01-22: qty 57

## 2023-01-22 MED ORDER — AMLODIPINE BESYLATE 10 MG PO TABS: 5.0000 mg | ORAL_TABLET | Freq: Every day | ORAL | 2 refills | Status: DC

## 2023-01-22 MED ORDER — FERROUS SULFATE 325 (65 FE) MG PO TBEC
325.0000 mg | DELAYED_RELEASE_TABLET | Freq: Every day | ORAL | 3 refills | Status: AC
Start: 2023-01-22 — End: ?

## 2023-01-22 MED ORDER — FUROSEMIDE 20 MG PO TABS: 20.0000 mg | ORAL_TABLET | Freq: Every day | ORAL | 1 refills | Status: DC

## 2023-01-22 MED ORDER — VITAMIN B-12 1000 MCG PO TABS: 1000.0000 ug | ORAL_TABLET | Freq: Every day | ORAL | 3 refills | Status: AC

## 2023-01-22 MED ORDER — PREDNISONE 20 MG PO TABS: 40.0000 mg | ORAL_TABLET | Freq: Every day | ORAL | 0 refills | Status: AC

## 2023-01-22 MED ORDER — CEPHALEXIN 250 MG PO CAPS: 500.0000 mg | ORAL_CAPSULE | Freq: Three times a day (TID) | ORAL | 0 refills | Status: AC

## 2023-01-22 MED ORDER — ALBUTEROL SULFATE (2.5 MG/3ML) 0.083% IN NEBU: 2.5000 mg | INHALATION_SOLUTION | RESPIRATORY_TRACT | 2 refills | Status: AC | PRN

## 2023-01-22 NOTE — Discharge Instructions (Signed)
1)Very low-salt diet advised 2)Weigh yourself daily, call if you gain more than 3 pounds in 1 day or more than 5 pounds in 1 week as your diuretic medications may need to be adjusted 3) repeat  BMP blood test in a week 4) please note that there has been some changes to your medications

## 2023-01-22 NOTE — Progress Notes (Signed)
0840 Patient eating breakfast, unavailable for nebulizer at this time.

## 2023-01-22 NOTE — Discharge Summary (Signed)
Catherine West, is a 89 y.o. female  DOB 25-Jun-1920  MRN [id number].  Admission date:  01/20/2023  Admitting Physician  Shanesha Bednarz Mariea Clonts, MD  Discharge Date:  01/22/2023   Primary MD  Benita Stabile, MD  Recommendations for primary care physician for things to follow:   1)Very low-salt diet advised 2)Weigh yourself daily, call if you gain more than 3 pounds in 1 day or more than 5 pounds in 1 week as your diuretic medications may need to be adjusted 3) repeat  BMP blood test in a week 4) please note that there has been some changes to your medications  Admission Diagnosis  Shortness of breath [R06.02] Elevated troponin [R79.89] Acute respiratory failure with hypoxia (HCC) [J96.01] AKI (acute kidney injury) (HCC) [N17.9]   Discharge Diagnosis  Shortness of breath [R06.02] Elevated troponin [R79.89] Acute respiratory failure with hypoxia (HCC) [J96.01] AKI (acute kidney injury) (HCC) [N17.9]    Principal Problem:   Acute respiratory failure with hypoxia (HCC) Active Problems:   Essential (primary) hypertension   Hyperlipidemia, unspecified   Hypothyroidism, unspecified   Compression fracture of L1 lumbar vertebra (HCC)   Chronic constipation   Vitamin B 12 deficiency   GERD without esophagitis   Chronic HFrEF (heart failure with reduced ejection fraction) (HCC)   Acute renal failure superimposed on stage 3b chronic kidney disease (HCC)   AKI (acute kidney injury) (HCC)      Past Medical History:  Diagnosis Date   Acute bronchitis    Acute bronchitis    Chronic kidney disease, stage 2 (mild)    Chronic kidney disease, unspecified    Cough    Essential (primary) hypertension    HTN (hypertension)    Hyperlipidemia, unspecified    Hypertensive chronic kidney disease with stage 1 through stage 4 chronic kidney disease, or unspecified chronic kidney disease    Hypothyroidism, unspecified     Major depressive disorder, single episode, unspecified    Osteoporosis    Osteoporosis    Pneumonia    Pneumonia    Urinary tract infection    Urinary tract infection     Past Surgical History:  Procedure Laterality Date   LESION REMOVAL Left 11/10/2022   Procedure: LESION REMOVAL TRUNK;  Surgeon: [name], MD;  Location: [facility];  Service: General;  Laterality: Left;  left lower trunk       HPI  from the history and physical done on the day of admission:   HPI: Catherine West is a 89 y.o. female with medical history significant of hypertension, hyperlipidemia, chronic kidney disease stage IIIb, hypothyroidism and gastroesophageal reflux disease; who presented to the hospital from assisted living facility secondary to increased coughing spells and shortness of breath.  Patient found to be hypoxic on room air.  No fever or chills reported.  Patient expressed no chest pain, no nausea, no vomiting, no dysuria, no hematuria, no melena, no hematochezia or focal weaknesses.   Patient expressed decreased oral intake associated with general malaise.   Workup in the ED  demonstrating negative COVID PCR, positive hypoxia on room air requiring 2 L nasal cannula supplementation.  There was also appreciated tachypnea.  Chest x-ray with bronchitic changes and CT scan demonstrating no pulmonary embolism and confirming the presence of multilobar pneumonia.   Cultures taken, antibiotics started and TRH consulted to place patient in the hospital for further evaluation and management.   Review of Systems: As mentioned in the history of present illness. All other systems reviewed and are negative.    Hospital Course:    Brief Narrative:  Pacific is a 89 y.o. female with medical history significant of hypertension, hyperlipidemia, chronic kidney disease stage IIIb, hypothyroidism and gastroesophageal reflux disease admitted on 01/20/2023 with acute hypoxic respiratory failure in setting of  bronchitis     -Assessment and Plan:   Acute renal failure superimposed on stage 3b chronic kidney disease (HCC) renally adjust medications, avoid nephrotoxic agents / dehydration  / hypotension -Judicious fluid resuscitation (patient with underlying history of CHF). - creatinine appears to have stabilized -Repeat BMP within a week   Elevated Troponin--83>>131>>161>>130- -EKG sinus rhythm with LBBB which is not new -Patient remains chest pain-free -Echo on 01/21/2023 with EF of 35 to 40% with grade 1 diastolic dysfunction, mild pulmonary hypertension, no aortic stenosis, no mitral stenosis   Combined systolic and diastolic CHF with reduced ejection fraction)--  -Echo as above -Losartan and furosemide as ordered   Acute hypoxic respiratory failure--suspect due to bronchitis,   -   Weaned off oxygen completely Oxygen sats 93 % on RA -Treated with Rocephin/azithromycin and mucolytics as well as bronchodilators -Discharge on Keflex and doxycycline   Vitamin B 12 deficiency with anemia -Serum B12 on 12/06/2022 was low normal  -Serum folate was low -TIBC was low and serum iron was low normal -Hgb currently greater than 9 -No bleeding concerns -Give folate, B12 and iron supplementation.   GERD without esophagitis -Continue the use of Pepcid.   Chronic constipation -Continue home laxative bowel regimen -Maintain adequate hydration.   Compression fracture of L1 lumbar vertebra /recent left superior and inferior pubic rami fractures -Continue as needed analgesics.   Hypothyroidism, unspecified -Recent TSH 3.810 -Continue Synthroid.   Hyperlipidemia, unspecified -Continue heart healthy diet.   Essential (primary) hypertension -Amlodipine decreased to 5 mg daily -Losartan 25 mg as above   Disposition: The patient is from: [facility]              Anticipated d/c is to: [facility]  Discharge Condition: Stable  Follow UP   Follow-up Information     Benita Stabile, MD  Follow up in 1 week(s).   Specialty: Internal Medicine Why: Repeat BMP Contact information: [address] [place] [address] [phone]                 Diet and Activity recommendation:  As advised  Discharge Instructions     Discharge Instructions     Call MD for:  difficulty breathing, headache or visual disturbances   Complete by: As directed    Call MD for:  persistant dizziness or light-headedness   Complete by: As directed    Call MD for:  persistant nausea and vomiting   Complete by: As directed    Call MD for:  temperature >100.4   Complete by: As directed    Diet - low sodium heart healthy   Complete by: As directed    Discharge instructions   Complete by: As directed    1)Very low-salt diet  advised 2)Weigh yourself daily, call if you gain more than 3 pounds in 1 day or more than 5 pounds in 1 week as your diuretic medications may need to be adjusted 3) repeat  BMP blood test in a week 4) please note that there has been some changes to your medications   Increase activity slowly   Complete by: As directed          Discharge Medications     Allergies as of 01/22/2023   No Known Allergies      Medication List     TAKE these medications    acetaminophen 650 MG CR tablet Commonly known as: TYLENOL Take 650 mg by mouth every 8 (eight) hours as needed for pain.   amLODipine 10 MG tablet Commonly known as: NORVASC Take 0.5 tablets (5 mg total) by mouth daily. What changed: how much to take   cephALEXin 250 MG capsule Commonly known as: KEFLEX Take 2 capsules (500 mg total) by mouth 3 (three) times daily for 5 days.   cyanocobalamin 1000 MCG tablet Commonly known as: VITAMIN B12 Take 1 tablet (1,000 mcg total) by mouth daily. What changed:  medication strength how much to take   doxycycline 100 MG tablet Commonly known as: VIBRA-TABS Take 1 tablet (100 mg total) by mouth 2 (two) times daily for 5 days.   famotidine 20 MG  tablet Commonly known as: PEPCID Take 1 tablet (20 mg total) by mouth daily.   ferrous sulfate 325 (65 FE) MG EC tablet Take 1 tablet (325 mg total) by mouth daily with breakfast.   folic acid 1 MG tablet Commonly known as: FOLVITE Take 1 tablet (1 mg total) by mouth daily.   furosemide 20 MG tablet Commonly known as: Lasix Take 1 tablet (20 mg total) by mouth daily.   guaiFENesin 600 MG 12 hr tablet Commonly known as: Mucinex Take 1 tablet (600 mg total) by mouth 2 (two) times daily.   loperamide 2 MG tablet Commonly known as: IMODIUM A-D Take 2 mg by mouth every 6 (six) hours as needed for diarrhea or loose stools.   losartan 50 MG tablet Commonly known as: COZAAR Take 50 mg by mouth daily.   polyethylene glycol 17 g packet Commonly known as: MIRALAX / GLYCOLAX Take 17 g by mouth daily.   predniSONE 20 MG tablet Commonly known as: DELTASONE Take 2 tablets (40 mg total) by mouth daily with breakfast for 5 days.   senna-docusate 8.6-50 MG tablet Commonly known as: Senokot-S Take 2 tablets by mouth 2 (two) times daily. What changed: how much to take   sertraline 100 MG tablet Commonly known as: ZOLOFT Take 100 mg by mouth daily.   Ventolin HFA 108 (90 Base) MCG/ACT inhaler Generic drug: albuterol Inhale 2 puffs into the lungs every 4 (four) hours as needed for wheezing or shortness of breath.   albuterol (2.5 MG/3ML) 0.083% nebulizer solution Commonly known as: PROVENTIL Take 3 mLs (2.5 mg total) by nebulization every 4 (four) hours as needed for wheezing or shortness of breath.   Vitamin D 50 MCG (2000 UT) Caps Take 2,000 Units by mouth daily.        Major procedures and Radiology Reports - PLEASE review detailed and final reports for all details, in brief -    ECHOCARDIOGRAM COMPLETE  Result Date: 01/21/2023    ECHOCARDIOGRAM REPORT   Patient Name:   COETTA GULLICKSON Date of Exam: 01/21/2023 Medical Rec #:  [id number]  Height:       62.0 in  Accession #:    [id number]       Weight:       129.4 lb Date of Birth:  Dec 26, 1920         BSA:          1.589 m Patient Age:    89 years        BP:           165/66 mmHg Patient Gender: F                HR:           67 bpm. Exam Location:  Inpatient Procedure: 2D Echo, Color Doppler and Cardiac Doppler Indications:    Elevated Troponin  History:        Patient has prior history of Echocardiogram examinations, most                 recent 09/05/2022. CHF; Risk Factors:Hypertension. CKD, stage 3.  Sonographer:    Lucendia Herrlich RCS Referring Phys: [id number] Whittley Carandang IMPRESSIONS  1. Left ventricular ejection fraction, by estimation, is 35 to 40%. The left ventricle has moderately decreased function. Left ventricular endocardial border not optimally defined to evaluate regional wall motion but there appears to be anterolateral wall hypokinesis. Consider Limited Echo with contrast for RWMA assessment. Left ventricular diastolic parameters are consistent with Grade I diastolic dysfunction (impaired relaxation). Elevated left ventricular end-diastolic pressure.  2. Right ventricular systolic function was not well visualized. The right ventricular size is normal. There is mildly elevated pulmonary artery systolic pressure. The estimated right ventricular systolic pressure is 40.3 mmHg.  3. Moderate pleural effusion in the left lateral region.  4. The mitral valve is normal in structure. Mild mitral valve regurgitation. No evidence of mitral stenosis.  5. The aortic valve has an indeterminant number of cusps. Aortic valve regurgitation is mild. No aortic stenosis is present.  6. The inferior vena cava is dilated in size with >50% respiratory variability, suggesting right atrial pressure of 8 mmHg.  7. Cannot exclude a small PFO. Comparison(s): No significant change from prior study. FINDINGS  Left Ventricle: Left ventricular ejection fraction, by estimation, is 35 to 40%. The left ventricle has moderately decreased  function. Left ventricular endocardial border not optimally defined to evaluate regional wall motion. The left ventricular internal cavity size was normal in size. There is no left ventricular hypertrophy. Left ventricular diastolic parameters are consistent with Grade I diastolic dysfunction (impaired relaxation). Elevated left ventricular end-diastolic pressure. Right Ventricle: The right ventricular size is normal. No increase in right ventricular wall thickness. Right ventricular systolic function was not well visualized. There is mildly elevated pulmonary artery systolic pressure. The tricuspid regurgitant velocity is 2.84 m/s, and with an assumed right atrial pressure of 8 mmHg, the estimated right ventricular systolic pressure is 40.3 mmHg. Left Atrium: Left atrial size was normal in size. Right Atrium: Right atrial size was normal in size. Pericardium: There is no evidence of pericardial effusion. Mitral Valve: The mitral valve is normal in structure. Mild mitral valve regurgitation. No evidence of mitral valve stenosis. Tricuspid Valve: The tricuspid valve is normal in structure. Tricuspid valve regurgitation is mild . No evidence of tricuspid stenosis. Aortic Valve: The aortic valve has an indeterminant number of cusps. Aortic valve regurgitation is mild. No aortic stenosis is present. Aortic valve peak gradient measures 7.8 mmHg. Pulmonic Valve: The pulmonic valve was not well visualized. Pulmonic valve regurgitation is  mild. No evidence of pulmonic stenosis. Aorta: The aortic root and ascending aorta are structurally normal, with no evidence of dilitation. Venous: The inferior vena cava is dilated in size with greater than 50% respiratory variability, suggesting right atrial pressure of 8 mmHg. IAS/Shunts: Cannot exclude a small PFO. Additional Comments: There is a moderate pleural effusion in the left lateral region.  LEFT VENTRICLE PLAX 2D LVIDd:         4.00 cm      Diastology LVIDs:         3.20 cm       LV e' medial:    4.54 cm/s LV PW:         1.20 cm      LV E/e' medial:  21.7 LV IVS:        1.20 cm      LV e' lateral:   7.22 cm/s LVOT diam:     1.90 cm      LV E/e' lateral: 13.6 LV SV:         47 LV SV Index:   29 LVOT Area:     2.84 cm  LV Volumes (MOD) LV vol d, MOD A2C: 109.0 ml LV vol d, MOD A4C: 84.8 ml LV vol s, MOD A2C: 79.1 ml LV vol s, MOD A4C: 46.5 ml LV SV MOD A2C:     29.9 ml LV SV MOD A4C:     84.8 ml LV SV MOD BP:      37.5 ml RIGHT VENTRICLE             IVC RV S prime:     10.90 cm/s  IVC diam: 2.70 cm TAPSE (M-mode): 1.8 cm LEFT ATRIUM             Index        RIGHT ATRIUM           Index LA diam:        4.70 cm 2.96 cm/m   RA Area:     14.50 cm LA Vol (A2C):   58.7 ml 36.95 ml/m  RA Volume:   30.80 ml  19.39 ml/m LA Vol (A4C):   30.7 ml 19.32 ml/m LA Biplane Vol: 43.4 ml 27.32 ml/m  AORTIC VALVE AV Area (Vmax): 1.82 cm AV Vmax:        140.00 cm/s AV Peak Grad:   7.8 mmHg LVOT Vmax:      89.64 cm/s LVOT Vmean:     56.900 cm/s LVOT VTI:       0.165 m  AORTA Ao Root diam: 2.90 cm Ao Asc diam:  2.90 cm MITRAL VALVE                TRICUSPID VALVE MV Area (PHT): 3.77 cm     TR Peak grad:   32.3 mmHg MV Decel Time: 201 msec     TR Vmax:        284.00 cm/s MR Peak grad: 120.7 mmHg MR Vmax:      549.33 cm/s   SHUNTS MV E velocity: 98.50 cm/s   Systemic VTI:  0.17 m MV A velocity: 126.00 cm/s  Systemic Diam: 1.90 cm MV E/A ratio:  0.78 Vishnu Priya Mallipeddi Electronically signed by Winfield Rast Mallipeddi Signature Date/Time: 01/21/2023/5:42:37 PM    Final    CT Angio Chest PE W and/or Wo Contrast  Result Date: 01/20/2023 CLINICAL DATA:  Pulmonary embolism (PE) suspected, high prob. Shortness of breath. Low oxygen saturation. No chest pain.  EXAM: CT ANGIOGRAPHY CHEST WITH CONTRAST TECHNIQUE: Multidetector CT imaging of the chest was performed using the standard protocol during bolus administration of intravenous contrast. Multiplanar CT image reconstructions and MIPs were obtained to  evaluate the vascular anatomy. RADIATION DOSE REDUCTION: This exam was performed according to the departmental dose-optimization program which includes automated exposure control, adjustment of the mA and/or kV according to patient size and/or use of iterative reconstruction technique. CONTRAST:  60mL OMNIPAQUE IOHEXOL 350 MG/ML SOLN COMPARISON:  CT scan chest from 10/14/2013. FINDINGS: Cardiovascular: Evaluation of pulmonary embolism beyond the segmental branches is limited due to patient's respiratory motion and streak artifacts from contrast within the superior vena cava. There is no embolism to the segmental pulmonary artery level. Normal cardiac size. No pericardial effusion. No aortic aneurysm. There are coronary artery calcifications, in keeping with coronary artery disease. There are also moderate to severe peripheral atherosclerotic vascular calcifications of thoracic aorta and its major branches. There is a subcentimeter sized intraluminal thrombus/noncalcified plaque in the descending thoracic arch, present since the prior study from 2015. Mediastinum/Nodes: Visualized thyroid gland appears grossly unremarkable. No solid / cystic mediastinal masses. The esophagus is nondistended precluding optimal assessment. No axillary, mediastinal or hilar lymphadenopathy by size criteria. Lungs/Pleura: The central tracheo-bronchial tree is patent. There is mild, smooth, circumferential thickening of the segmental and subsegmental bronchial walls, throughout bilateral lungs, which is nonspecific. Findings are most commonly seen with bronchitis or reactive airway disease, such as asthma. There is small left and small-to-moderate right pleural effusion with associated compressive atelectatic changes in the bilateral lower lobes. There are additional patchy ground-glass changes in the posterior segment of right upper lobe as well as bilateral lower lobes, posteroinferiorly. There are small groupings of tree-in-bud  configuration nodules in the middle lobe and left lung upper lobe. There is subsegmental atelectasis in the middle lobe as well. No suspicious mass. Upper Abdomen: Visualized upper abdominal viscera within normal limits. Stable bilateral adrenal glands. Musculoskeletal: The visualized soft tissues of the chest wall are grossly unremarkable. No suspicious osseous lesions. There are mild multilevel degenerative changes in the visualized spine. Redemonstration of moderate anterior wedging deformity of T4 vertebral body, similar to the prior study from year 2015. There is moderate to marked compression deformity of L1 vertebral body, similar to the prior study from 12/03/2022. No significant retropulsion or spinal canal compromise. Review of the MIP images confirms the above findings. IMPRESSION: 1. No pulmonary embolism to the segmental pulmonary artery level. 2. Small left and small-to-moderate right pleural effusion with associated compressive atelectatic changes in the bilateral lower lobes. 3. Patchy ground-glass changes in the posterior segment of right upper lobe as well as bilateral lower lobes, posteroinferiorly. Small groupings of tree-in-bud configuration nodules in the middle lobe and left lung upper lobe. Findings are nonspecific but may be infectious or inflammatory in etiology. 4. Mild, smooth, circumferential thickening of the segmental and subsegmental bronchial walls, throughout bilateral lungs, which is nonspecific. Findings are most commonly seen with bronchitis or reactive airway disease, such as asthma. 5. Multiple other nonacute observations, as described above. Aortic Atherosclerosis (ICD10-I70.0). Electronically Signed   By: Jules Schick M.D.   On: 01/20/2023 11:13   DG Chest Port 1 View  Result Date: 01/20/2023 CLINICAL DATA:  Shortness of breath. EXAM: PORTABLE CHEST 1 VIEW COMPARISON:  December 02, 2022. FINDINGS: Stable cardiomediastinal silhouette. Minimal bibasilar subsegmental  atelectasis or scarring is noted with small pleural effusions. Bony thorax is unremarkable. IMPRESSION: Minimal bibasilar subsegmental atelectasis or scarring with  small pleural effusions. Electronically Signed   By: Lupita Raider M.D.   On: 01/20/2023 08:33    Micro Results   Recent Results (from the past 240 hour(s))  SARS Coronavirus 2 by RT PCR (hospital order, performed in [facility] hospital lab) *cepheid single result test* Anterior Nasal Swab     Status: None   Collection Time: 01/20/23 12:34 PM   Specimen: Anterior Nasal Swab  Result Value Ref Range Status   SARS Coronavirus 2 by RT PCR NEGATIVE NEGATIVE Final    Comment: (NOTE) SARS-CoV-2 target nucleic acids are NOT DETECTED.  The SARS-CoV-2 RNA is generally detectable in upper and lower respiratory specimens during the acute phase of infection. The lowest concentration of SARS-CoV-2 viral copies this assay can detect is 250 copies / mL. A negative result does not preclude SARS-CoV-2 infection and should not be used as the sole basis for treatment or other patient management decisions.  A negative result may occur with improper specimen collection / handling, submission of specimen other than nasopharyngeal swab, presence of viral mutation(s) within the areas targeted by this assay, and inadequate number of viral copies (<250 copies / mL). A negative result must be combined with clinical observations, patient history, and epidemiological information.  Fact Sheet for Patients:   RoadLapTop.co.za  Fact Sheet for Healthcare Providers: http://kim-miller.com/  This test is not yet approved or  cleared by the [place] FDA and has been authorized for detection and/or diagnosis of SARS-CoV-2 by FDA under an Emergency Use Authorization (EUA).  This EUA will remain in effect (meaning this test can be used) for the duration of the COVID-19 declaration under Section 564(b)(1) of  the Act, 21 U.S.C. section 360bbb-3(b)(1), unless the authorization is terminated or revoked sooner.  Performed at [facility], [address] [place], [place] [address]    Today   Subjective    Alexyia Mozo today has no new complaints  Weaned off oxygen completely Oxygen sats 93 % on RA No fever  Or chills  -Voiding well         Patient has been seen and examined prior to discharge   Objective   Blood pressure 120/83, pulse 93, temperature 98.3 F (36.8 C), temperature source Oral, resp. rate 19, SpO2 91%.   Intake/Output Summary (Last 24 hours) at 01/22/2023 1330 Last data filed at 01/22/2023 0910 Gross per 24 hour  Intake 100 ml  Output 400 ml  Net -300 ml    Exam Gen:- Awake Alert, no acute distress , speaking in sentences HEENT:- [place].AT, No sclera icterus Neck-Supple Neck,No JVD,.  Lungs-  Improved air movement bilaterally, no rhonchi CV- S1, S2 normal, regular Abd-  +ve B.Sounds, Abd Soft, No tenderness,    Extremity/Skin:- No significant edema,   good pulses Psych-affect is appropriate, oriented x3 Neuro-no new focal deficits, no tremors    Data Review   CBC w Diff:  Lab Results  Component Value Date   WBC 7.3 01/21/2023   HGB 9.6 (L) 01/21/2023   HCT 30.9 (L) 01/21/2023   PLT 144 (L) 01/21/2023   LYMPHOPCT 16 12/04/2022   MONOPCT 11 12/04/2022   EOSPCT 2 12/04/2022   BASOPCT 1 12/04/2022    CMP:  Lab Results  Component Value Date   NA 139 01/21/2023   NA 142 01/10/2019   K 3.8 01/21/2023   CL 106 01/21/2023   CO2 23 01/21/2023   BUN 24 (H) 01/21/2023   BUN 26 (A) 01/10/2019   CREATININE 1.20 (  H) 01/21/2023   GLU 102 01/10/2019   PROT 6.3 (L) 12/04/2022   ALBUMIN 3.0 (L) 12/04/2022   BILITOT 0.9 12/04/2022   ALKPHOS 55 12/04/2022   AST 17 12/04/2022   ALT 10 12/04/2022  .  Total Discharge time is about 33 minutes  Shon Hale M.D on 01/22/2023 at 1:30 PM  Go to [email or web address] -  for contact info  [facility] -  Office  [phone]

## 2023-01-22 NOTE — TOC Transition Note (Addendum)
Transition of Care [facility]) - CM/SW Discharge Note   Patient Details  Name: Catherine West MRN: [id number] Date of Birth: 02-13-1921  Transition of Care [facility]) CM/SW Contact:  Princella Ion, LCSW Phone Number: 01/22/2023, 10:38 AM   Clinical Narrative:    Pt will [facility] to [facility] [address] today. Notified Marcelino Duster who is requesting FL2 be faxed at [phone]. Awaiting discharge summary to fax FL2.  MD reports family is at bedside and does not feel comfortable transporting - pt will return to [facility] via [facility]. No further TOC needs.      Barriers to Discharge: Continued Medical Work up   Patient Goals and CMS Choice CMS Medicare.gov Compare Post Acute Care list provided to:: Patient Choice offered to / list presented to : Patient  Discharge Placement                         Discharge Plan and Services Additional resources added to the After Visit Summary for   In-house Referral: Clinical Social Work Discharge Planning Services: CM Consult Post Acute Care Choice: Resumption of Svcs/PTA Provider                               Social Determinants of Health (SDOH) Interventions SDOH Screenings   Food Insecurity: No Food Insecurity (01/21/2023)  Housing: Low Risk  (01/21/2023)  Transportation Needs: No Transportation Needs (01/21/2023)  Utilities: Not At Risk (01/21/2023)  Tobacco Use: Low Risk  (12/23/2022)     Readmission Risk Interventions     No data to display

## 2023-01-22 NOTE — Progress Notes (Signed)
Spoke with Terrance Mass, RN at [facility] and provided report and explained [facility] transport would bring pt to facility.

## 2023-01-22 NOTE — NC FL2 (Signed)
[place] MEDICAID FL2 LEVEL OF CARE FORM     IDENTIFICATION  Patient Name: Catherine West Birthdate: 1921/04/12 Sex: female Admission Date (Current Location): 01/20/2023  [facility] and [place] Number:  [name] and Address:  [facility],  [address], [place] [address]      Provider Number: [id number]  Attending Physician Name and Address:  Shon Hale, MD  Relative Name and Phone Number:       Current Level of Care: Hospital Recommended Level of Care: Assisted Living Facility Prior Approval Number:    Date Approved/Denied:   PASRR Number:    Discharge Plan: Other (Comment) (ALF)    Current Diagnoses: Patient Active Problem List   Diagnosis Date Noted   AKI (acute kidney injury) (HCC) 01/21/2023   Acute respiratory failure with hypoxia (HCC) 01/20/2023   Acute renal failure superimposed on stage 3b chronic kidney disease (HCC) 01/20/2023   Right lower lobe pneumonia 12/09/2022   Pubic ramus fracture, left, sequela 12/08/2022   Benign hypertension with stage 3b chronic kidney disease (HCC) 12/08/2022   Chronic constipation 12/08/2022   Macrocytic anemia 12/08/2022   Folate deficiency 12/08/2022   Vitamin B 12 deficiency 12/08/2022   GERD without esophagitis 12/08/2022   Aortic atherosclerosis (HCC) 12/08/2022   Chronic HFrEF (heart failure with reduced ejection fraction) (HCC) 12/08/2022   Stage 3b chronic kidney disease (HCC) 12/08/2022   Compression fracture of L1 lumbar vertebra (HCC) 12/03/2022   Leukocytosis 12/03/2022   Basal cell carcinoma (BCC) of abdomen 11/10/2022   Acute CHF (HCC) 09/04/2022   Essential (primary) hypertension    Hyperlipidemia, unspecified    Major depressive disorder, single episode, unspecified    Hypothyroidism, unspecified    Osteoporosis    Gait abnormality 02/22/2019   Falls frequently 02/22/2019    Orientation RESPIRATION BLADDER Height & Weight     Self, Place, Situation  Normal  Continent Weight:   Height:     BEHAVIORAL SYMPTOMS/MOOD NEUROLOGICAL BOWEL NUTRITION STATUS      Continent Diet (MD advises low salt diet)  AMBULATORY STATUS COMMUNICATION OF NEEDS Skin   Limited Assist Verbally Normal                       Personal Care Assistance Level of Assistance  Bathing, Feeding, Dressing Bathing Assistance: Limited assistance Feeding assistance: Limited assistance Dressing Assistance: Limited assistance     Functional Limitations Info  Sight, Hearing, Speech Sight Info: Impaired Hearing Info: Impaired Speech Info: Adequate    SPECIAL CARE FACTORS FREQUENCY                       Contractures Contractures Info: Not present    Additional Factors Info  Code Status, Allergies Code Status Info: DNR- Limited Allergies Info: NKA           Current Medications (01/22/2023):  This is the current hospital active medication list Current Facility-Administered Medications  Medication Dose Route Frequency Provider Last Rate Last Admin   acetaminophen (TYLENOL) tablet 650 mg  650 mg Oral Q6H PRN Vassie Loll, MD       Or   acetaminophen (TYLENOL) suppository 650 mg  650 mg Rectal Q6H PRN Vassie Loll, MD       amLODipine (NORVASC) tablet 10 mg  10 mg Oral Daily Vassie Loll, MD   10 mg at 01/22/23 0957   azithromycin (ZITHROMAX) 500 mg in sodium chloride 0.9 % 250 mL IVPB  500 mg  Intravenous Q24H Vassie Loll, MD 250 mL/hr at 01/21/23 1601 500 mg at 01/21/23 1601   budesonide (PULMICORT) nebulizer solution 0.5 mg  0.5 mg Nebulization BID Vassie Loll, MD   0.5 mg at 01/22/23 0956   cefTRIAXone (ROCEPHIN) 2 g in sodium chloride 0.9 % 100 mL IVPB  2 g Intravenous Q24H Vassie Loll, MD 200 mL/hr at 01/21/23 1810 2 g at 01/21/23 1810   dextromethorphan-guaiFENesin (MUCINEX DM) 30-600 MG per 12 hr tablet 1 tablet  1 tablet Oral BID Vassie Loll, MD   1 tablet at 01/22/23 0957   enoxaparin (LOVENOX) injection 30 mg  30 mg Subcutaneous Q24H  Vassie Loll, MD   30 mg at 01/21/23 2137   famotidine (PEPCID) tablet 20 mg  20 mg Oral QHS Vassie Loll, MD   20 mg at 01/21/23 2136   folic acid (FOLVITE) tablet 1 mg  1 mg Oral Daily Vassie Loll, MD   1 mg at 01/22/23 0957   ipratropium-albuterol (DUONEB) 0.5-2.5 (3) MG/3ML nebulizer solution 3 mL  3 mL Nebulization Q6H PRN Vassie Loll, MD   3 mL at 01/22/23 0958   liver oil-zinc oxide (DESITIN) 40 % ointment   Topical TID Frankey Shown, DO   Given at 01/22/23 1118   ondansetron (ZOFRAN) tablet 4 mg  4 mg Oral Q6H PRN Vassie Loll, MD       Or   ondansetron [facility]) injection 4 mg  4 mg Intravenous Q6H PRN Vassie Loll, MD       polyethylene glycol (MIRALAX / GLYCOLAX) packet 17 g  17 g Oral Daily PRN Vassie Loll, MD       senna-docusate (Senokot-S) tablet 1 tablet  1 tablet Oral BID Vassie Loll, MD   1 tablet at 01/22/23 0957   sertraline (ZOLOFT) tablet 100 mg  100 mg Oral Daily Vassie Loll, MD   100 mg at 01/22/23 1610     Discharge Medications:  Discharge Medications      Allergies as of 01/22/2023   No Known Allergies         Medication List       TAKE these medications     acetaminophen 650 MG CR tablet Commonly known as: TYLENOL Take 650 mg by mouth every 8 (eight) hours as needed for pain.    amLODipine 10 MG tablet Commonly known as: NORVASC Take 0.5 tablets (5 mg total) by mouth daily. What changed: how much to take    cephALEXin 250 MG capsule Commonly known as: KEFLEX Take 2 capsules (500 mg total) by mouth 3 (three) times daily for 5 days.    cyanocobalamin 1000 MCG tablet Commonly known as: VITAMIN B12 Take 1 tablet (1,000 mcg total) by mouth daily. What changed:  medication strength how much to take    doxycycline 100 MG tablet Commonly known as: VIBRA-TABS Take 1 tablet (100 mg total) by mouth 2 (two) times daily for 5 days.    famotidine 20 MG tablet Commonly known as: PEPCID Take 1 tablet (20 mg total) by mouth  daily.    ferrous sulfate 325 (65 FE) MG EC tablet Take 1 tablet (325 mg total) by mouth daily with breakfast.    folic acid 1 MG tablet Commonly known as: FOLVITE Take 1 tablet (1 mg total) by mouth daily.    furosemide 20 MG tablet Commonly known as: Lasix Take 1 tablet (20 mg total) by mouth daily.    guaiFENesin 600 MG 12 hr tablet Commonly known as: Mucinex Take 1  tablet (600 mg total) by mouth 2 (two) times daily.    loperamide 2 MG tablet Commonly known as: IMODIUM A-D Take 2 mg by mouth every 6 (six) hours as needed for diarrhea or loose stools.    losartan 50 MG tablet Commonly known as: COZAAR Take 50 mg by mouth daily.    polyethylene glycol 17 g packet Commonly known as: MIRALAX / GLYCOLAX Take 17 g by mouth daily.    predniSONE 20 MG tablet Commonly known as: DELTASONE Take 2 tablets (40 mg total) by mouth daily with breakfast for 5 days.    senna-docusate 8.6-50 MG tablet Commonly known as: Senokot-S Take 2 tablets by mouth 2 (two) times daily. What changed: how much to take    sertraline 100 MG tablet Commonly known as: ZOLOFT Take 100 mg by mouth daily.    Ventolin HFA 108 (90 Base) MCG/ACT inhaler Generic drug: albuterol Inhale 2 puffs into the lungs every 4 (four) hours as needed for wheezing or shortness of breath.    albuterol (2.5 MG/3ML) 0.083% nebulizer solution Commonly known as: PROVENTIL Take 3 mLs (2.5 mg total) by nebulization every 4 (four) hours as needed for wheezing or shortness of breath.    Vitamin D 50 MCG (2000 UT) Caps Take 2,000 Units by mouth daily.    Relevant Imaging Results:  Relevant Lab Results:   Additional Information SSN: [id number]  Princella Ion, [place]

## 2023-01-31 DIAGNOSIS — I10 Essential (primary) hypertension: Secondary | ICD-10-CM | POA: Diagnosis not present

## 2023-01-31 DIAGNOSIS — I130 Hypertensive heart and chronic kidney disease with heart failure and stage 1 through stage 4 chronic kidney disease, or unspecified chronic kidney disease: Secondary | ICD-10-CM | POA: Diagnosis not present

## 2023-02-04 DIAGNOSIS — R609 Edema, unspecified: Secondary | ICD-10-CM | POA: Diagnosis not present

## 2023-02-07 DIAGNOSIS — I509 Heart failure, unspecified: Secondary | ICD-10-CM | POA: Diagnosis not present

## 2023-02-07 DIAGNOSIS — R600 Localized edema: Secondary | ICD-10-CM | POA: Diagnosis not present

## 2023-02-07 DIAGNOSIS — R0602 Shortness of breath: Secondary | ICD-10-CM | POA: Diagnosis not present

## 2023-02-07 DIAGNOSIS — I110 Hypertensive heart disease with heart failure: Secondary | ICD-10-CM | POA: Diagnosis not present

## 2023-02-08 DIAGNOSIS — B351 Tinea unguium: Secondary | ICD-10-CM | POA: Diagnosis not present

## 2023-02-08 DIAGNOSIS — R0602 Shortness of breath: Secondary | ICD-10-CM | POA: Diagnosis not present

## 2023-02-08 DIAGNOSIS — M79675 Pain in left toe(s): Secondary | ICD-10-CM | POA: Diagnosis not present

## 2023-02-08 DIAGNOSIS — J189 Pneumonia, unspecified organism: Secondary | ICD-10-CM | POA: Diagnosis not present

## 2023-02-10 DIAGNOSIS — I10 Essential (primary) hypertension: Secondary | ICD-10-CM | POA: Diagnosis not present

## 2023-02-10 DIAGNOSIS — I509 Heart failure, unspecified: Secondary | ICD-10-CM | POA: Diagnosis not present

## 2023-02-10 DIAGNOSIS — F329 Major depressive disorder, single episode, unspecified: Secondary | ICD-10-CM | POA: Diagnosis not present

## 2023-02-10 DIAGNOSIS — E785 Hyperlipidemia, unspecified: Secondary | ICD-10-CM | POA: Diagnosis not present

## 2023-02-21 DIAGNOSIS — J189 Pneumonia, unspecified organism: Secondary | ICD-10-CM | POA: Diagnosis not present

## 2023-02-21 DIAGNOSIS — Z9981 Dependence on supplemental oxygen: Secondary | ICD-10-CM | POA: Diagnosis not present

## 2023-02-23 DIAGNOSIS — R0989 Other specified symptoms and signs involving the circulatory and respiratory systems: Secondary | ICD-10-CM | POA: Diagnosis not present

## 2023-02-23 DIAGNOSIS — R0602 Shortness of breath: Secondary | ICD-10-CM | POA: Diagnosis not present

## 2023-02-28 DIAGNOSIS — I130 Hypertensive heart and chronic kidney disease with heart failure and stage 1 through stage 4 chronic kidney disease, or unspecified chronic kidney disease: Secondary | ICD-10-CM | POA: Diagnosis not present

## 2023-02-28 DIAGNOSIS — M810 Age-related osteoporosis without current pathological fracture: Secondary | ICD-10-CM | POA: Diagnosis not present

## 2023-02-28 DIAGNOSIS — E782 Mixed hyperlipidemia: Secondary | ICD-10-CM | POA: Diagnosis not present

## 2023-02-28 DIAGNOSIS — J189 Pneumonia, unspecified organism: Secondary | ICD-10-CM | POA: Diagnosis not present

## 2023-02-28 DIAGNOSIS — I509 Heart failure, unspecified: Secondary | ICD-10-CM | POA: Diagnosis not present

## 2023-02-28 DIAGNOSIS — R600 Localized edema: Secondary | ICD-10-CM | POA: Diagnosis not present

## 2023-03-02 DIAGNOSIS — E559 Vitamin D deficiency, unspecified: Secondary | ICD-10-CM | POA: Diagnosis not present

## 2023-03-02 DIAGNOSIS — E782 Mixed hyperlipidemia: Secondary | ICD-10-CM | POA: Diagnosis not present

## 2023-03-02 DIAGNOSIS — I509 Heart failure, unspecified: Secondary | ICD-10-CM | POA: Diagnosis not present

## 2023-03-02 DIAGNOSIS — F32A Depression, unspecified: Secondary | ICD-10-CM | POA: Diagnosis not present

## 2023-03-02 DIAGNOSIS — I130 Hypertensive heart and chronic kidney disease with heart failure and stage 1 through stage 4 chronic kidney disease, or unspecified chronic kidney disease: Secondary | ICD-10-CM | POA: Diagnosis not present

## 2023-03-02 DIAGNOSIS — J189 Pneumonia, unspecified organism: Secondary | ICD-10-CM | POA: Diagnosis not present

## 2023-03-02 DIAGNOSIS — N189 Chronic kidney disease, unspecified: Secondary | ICD-10-CM | POA: Diagnosis not present

## 2023-03-02 DIAGNOSIS — R600 Localized edema: Secondary | ICD-10-CM | POA: Diagnosis not present

## 2023-03-02 DIAGNOSIS — R296 Repeated falls: Secondary | ICD-10-CM | POA: Diagnosis not present

## 2023-03-02 DIAGNOSIS — Z4800 Encounter for change or removal of nonsurgical wound dressing: Secondary | ICD-10-CM | POA: Diagnosis not present

## 2023-03-02 DIAGNOSIS — Z556 Problems related to health literacy: Secondary | ICD-10-CM | POA: Diagnosis not present

## 2023-03-02 DIAGNOSIS — F419 Anxiety disorder, unspecified: Secondary | ICD-10-CM | POA: Diagnosis not present

## 2023-03-08 DIAGNOSIS — E559 Vitamin D deficiency, unspecified: Secondary | ICD-10-CM | POA: Diagnosis not present

## 2023-03-08 DIAGNOSIS — J189 Pneumonia, unspecified organism: Secondary | ICD-10-CM | POA: Diagnosis not present

## 2023-03-08 DIAGNOSIS — F32A Depression, unspecified: Secondary | ICD-10-CM | POA: Diagnosis not present

## 2023-03-08 DIAGNOSIS — R296 Repeated falls: Secondary | ICD-10-CM | POA: Diagnosis not present

## 2023-03-08 DIAGNOSIS — I130 Hypertensive heart and chronic kidney disease with heart failure and stage 1 through stage 4 chronic kidney disease, or unspecified chronic kidney disease: Secondary | ICD-10-CM | POA: Diagnosis not present

## 2023-03-08 DIAGNOSIS — Z4800 Encounter for change or removal of nonsurgical wound dressing: Secondary | ICD-10-CM | POA: Diagnosis not present

## 2023-03-08 DIAGNOSIS — R600 Localized edema: Secondary | ICD-10-CM | POA: Diagnosis not present

## 2023-03-08 DIAGNOSIS — Z556 Problems related to health literacy: Secondary | ICD-10-CM | POA: Diagnosis not present

## 2023-03-08 DIAGNOSIS — F419 Anxiety disorder, unspecified: Secondary | ICD-10-CM | POA: Diagnosis not present

## 2023-03-08 DIAGNOSIS — E782 Mixed hyperlipidemia: Secondary | ICD-10-CM | POA: Diagnosis not present

## 2023-03-08 DIAGNOSIS — I509 Heart failure, unspecified: Secondary | ICD-10-CM | POA: Diagnosis not present

## 2023-03-08 DIAGNOSIS — N189 Chronic kidney disease, unspecified: Secondary | ICD-10-CM | POA: Diagnosis not present

## 2023-03-10 DIAGNOSIS — F419 Anxiety disorder, unspecified: Secondary | ICD-10-CM | POA: Diagnosis not present

## 2023-03-10 DIAGNOSIS — I509 Heart failure, unspecified: Secondary | ICD-10-CM | POA: Diagnosis not present

## 2023-03-10 DIAGNOSIS — R296 Repeated falls: Secondary | ICD-10-CM | POA: Diagnosis not present

## 2023-03-10 DIAGNOSIS — I130 Hypertensive heart and chronic kidney disease with heart failure and stage 1 through stage 4 chronic kidney disease, or unspecified chronic kidney disease: Secondary | ICD-10-CM | POA: Diagnosis not present

## 2023-03-10 DIAGNOSIS — R600 Localized edema: Secondary | ICD-10-CM | POA: Diagnosis not present

## 2023-03-10 DIAGNOSIS — J189 Pneumonia, unspecified organism: Secondary | ICD-10-CM | POA: Diagnosis not present

## 2023-03-10 DIAGNOSIS — N189 Chronic kidney disease, unspecified: Secondary | ICD-10-CM | POA: Diagnosis not present

## 2023-03-10 DIAGNOSIS — E559 Vitamin D deficiency, unspecified: Secondary | ICD-10-CM | POA: Diagnosis not present

## 2023-03-10 DIAGNOSIS — Z4800 Encounter for change or removal of nonsurgical wound dressing: Secondary | ICD-10-CM | POA: Diagnosis not present

## 2023-03-10 DIAGNOSIS — E782 Mixed hyperlipidemia: Secondary | ICD-10-CM | POA: Diagnosis not present

## 2023-03-10 DIAGNOSIS — Z556 Problems related to health literacy: Secondary | ICD-10-CM | POA: Diagnosis not present

## 2023-03-10 DIAGNOSIS — F32A Depression, unspecified: Secondary | ICD-10-CM | POA: Diagnosis not present

## 2023-03-11 DIAGNOSIS — E782 Mixed hyperlipidemia: Secondary | ICD-10-CM | POA: Diagnosis not present

## 2023-03-11 DIAGNOSIS — E039 Hypothyroidism, unspecified: Secondary | ICD-10-CM | POA: Diagnosis not present

## 2023-03-11 DIAGNOSIS — E119 Type 2 diabetes mellitus without complications: Secondary | ICD-10-CM | POA: Diagnosis not present

## 2023-03-11 DIAGNOSIS — I10 Essential (primary) hypertension: Secondary | ICD-10-CM | POA: Diagnosis not present

## 2023-03-11 DIAGNOSIS — E559 Vitamin D deficiency, unspecified: Secondary | ICD-10-CM | POA: Diagnosis not present

## 2023-03-13 DIAGNOSIS — I10 Essential (primary) hypertension: Secondary | ICD-10-CM | POA: Diagnosis not present

## 2023-03-13 DIAGNOSIS — I509 Heart failure, unspecified: Secondary | ICD-10-CM | POA: Diagnosis not present

## 2023-03-13 DIAGNOSIS — J449 Chronic obstructive pulmonary disease, unspecified: Secondary | ICD-10-CM | POA: Diagnosis not present

## 2023-03-13 DIAGNOSIS — J9620 Acute and chronic respiratory failure, unspecified whether with hypoxia or hypercapnia: Secondary | ICD-10-CM | POA: Diagnosis not present

## 2023-03-13 DIAGNOSIS — E785 Hyperlipidemia, unspecified: Secondary | ICD-10-CM | POA: Diagnosis not present

## 2023-03-13 DIAGNOSIS — J9691 Respiratory failure, unspecified with hypoxia: Secondary | ICD-10-CM | POA: Diagnosis not present

## 2023-03-13 DIAGNOSIS — R0602 Shortness of breath: Secondary | ICD-10-CM | POA: Diagnosis not present

## 2023-03-13 DIAGNOSIS — F329 Major depressive disorder, single episode, unspecified: Secondary | ICD-10-CM | POA: Diagnosis not present

## 2023-03-16 DIAGNOSIS — Z79899 Other long term (current) drug therapy: Secondary | ICD-10-CM | POA: Diagnosis not present

## 2023-03-16 DIAGNOSIS — Z4800 Encounter for change or removal of nonsurgical wound dressing: Secondary | ICD-10-CM | POA: Diagnosis not present

## 2023-03-16 DIAGNOSIS — F419 Anxiety disorder, unspecified: Secondary | ICD-10-CM | POA: Diagnosis not present

## 2023-03-16 DIAGNOSIS — E559 Vitamin D deficiency, unspecified: Secondary | ICD-10-CM | POA: Diagnosis not present

## 2023-03-16 DIAGNOSIS — R600 Localized edema: Secondary | ICD-10-CM | POA: Diagnosis not present

## 2023-03-16 DIAGNOSIS — I509 Heart failure, unspecified: Secondary | ICD-10-CM | POA: Diagnosis not present

## 2023-03-16 DIAGNOSIS — J189 Pneumonia, unspecified organism: Secondary | ICD-10-CM | POA: Diagnosis not present

## 2023-03-16 DIAGNOSIS — E876 Hypokalemia: Secondary | ICD-10-CM | POA: Diagnosis not present

## 2023-03-16 DIAGNOSIS — F32A Depression, unspecified: Secondary | ICD-10-CM | POA: Diagnosis not present

## 2023-03-16 DIAGNOSIS — I130 Hypertensive heart and chronic kidney disease with heart failure and stage 1 through stage 4 chronic kidney disease, or unspecified chronic kidney disease: Secondary | ICD-10-CM | POA: Diagnosis not present

## 2023-03-16 DIAGNOSIS — Z556 Problems related to health literacy: Secondary | ICD-10-CM | POA: Diagnosis not present

## 2023-03-16 DIAGNOSIS — N189 Chronic kidney disease, unspecified: Secondary | ICD-10-CM | POA: Diagnosis not present

## 2023-03-16 DIAGNOSIS — E782 Mixed hyperlipidemia: Secondary | ICD-10-CM | POA: Diagnosis not present

## 2023-03-16 DIAGNOSIS — R296 Repeated falls: Secondary | ICD-10-CM | POA: Diagnosis not present

## 2023-03-26 DIAGNOSIS — Z556 Problems related to health literacy: Secondary | ICD-10-CM | POA: Diagnosis not present

## 2023-03-26 DIAGNOSIS — I130 Hypertensive heart and chronic kidney disease with heart failure and stage 1 through stage 4 chronic kidney disease, or unspecified chronic kidney disease: Secondary | ICD-10-CM | POA: Diagnosis not present

## 2023-03-26 DIAGNOSIS — I509 Heart failure, unspecified: Secondary | ICD-10-CM | POA: Diagnosis not present

## 2023-03-26 DIAGNOSIS — R296 Repeated falls: Secondary | ICD-10-CM | POA: Diagnosis not present

## 2023-03-26 DIAGNOSIS — Z4800 Encounter for change or removal of nonsurgical wound dressing: Secondary | ICD-10-CM | POA: Diagnosis not present

## 2023-03-26 DIAGNOSIS — E782 Mixed hyperlipidemia: Secondary | ICD-10-CM | POA: Diagnosis not present

## 2023-03-26 DIAGNOSIS — E559 Vitamin D deficiency, unspecified: Secondary | ICD-10-CM | POA: Diagnosis not present

## 2023-03-26 DIAGNOSIS — R600 Localized edema: Secondary | ICD-10-CM | POA: Diagnosis not present

## 2023-03-26 DIAGNOSIS — F32A Depression, unspecified: Secondary | ICD-10-CM | POA: Diagnosis not present

## 2023-03-26 DIAGNOSIS — N189 Chronic kidney disease, unspecified: Secondary | ICD-10-CM | POA: Diagnosis not present

## 2023-03-26 DIAGNOSIS — F419 Anxiety disorder, unspecified: Secondary | ICD-10-CM | POA: Diagnosis not present

## 2023-03-26 DIAGNOSIS — J189 Pneumonia, unspecified organism: Secondary | ICD-10-CM | POA: Diagnosis not present

## 2023-03-28 DIAGNOSIS — E559 Vitamin D deficiency, unspecified: Secondary | ICD-10-CM | POA: Diagnosis not present

## 2023-03-28 DIAGNOSIS — R600 Localized edema: Secondary | ICD-10-CM | POA: Diagnosis not present

## 2023-03-28 DIAGNOSIS — K219 Gastro-esophageal reflux disease without esophagitis: Secondary | ICD-10-CM | POA: Diagnosis not present

## 2023-03-28 DIAGNOSIS — N182 Chronic kidney disease, stage 2 (mild): Secondary | ICD-10-CM | POA: Diagnosis not present

## 2023-03-28 DIAGNOSIS — I509 Heart failure, unspecified: Secondary | ICD-10-CM | POA: Diagnosis not present

## 2023-03-29 DIAGNOSIS — R296 Repeated falls: Secondary | ICD-10-CM | POA: Diagnosis not present

## 2023-03-29 DIAGNOSIS — R600 Localized edema: Secondary | ICD-10-CM | POA: Diagnosis not present

## 2023-03-29 DIAGNOSIS — N189 Chronic kidney disease, unspecified: Secondary | ICD-10-CM | POA: Diagnosis not present

## 2023-03-29 DIAGNOSIS — F32A Depression, unspecified: Secondary | ICD-10-CM | POA: Diagnosis not present

## 2023-03-29 DIAGNOSIS — J189 Pneumonia, unspecified organism: Secondary | ICD-10-CM | POA: Diagnosis not present

## 2023-03-29 DIAGNOSIS — Z556 Problems related to health literacy: Secondary | ICD-10-CM | POA: Diagnosis not present

## 2023-03-29 DIAGNOSIS — E559 Vitamin D deficiency, unspecified: Secondary | ICD-10-CM | POA: Diagnosis not present

## 2023-03-29 DIAGNOSIS — F419 Anxiety disorder, unspecified: Secondary | ICD-10-CM | POA: Diagnosis not present

## 2023-03-29 DIAGNOSIS — I130 Hypertensive heart and chronic kidney disease with heart failure and stage 1 through stage 4 chronic kidney disease, or unspecified chronic kidney disease: Secondary | ICD-10-CM | POA: Diagnosis not present

## 2023-03-29 DIAGNOSIS — Z4800 Encounter for change or removal of nonsurgical wound dressing: Secondary | ICD-10-CM | POA: Diagnosis not present

## 2023-03-29 DIAGNOSIS — E782 Mixed hyperlipidemia: Secondary | ICD-10-CM | POA: Diagnosis not present

## 2023-03-29 DIAGNOSIS — I509 Heart failure, unspecified: Secondary | ICD-10-CM | POA: Diagnosis not present

## 2023-04-01 DIAGNOSIS — F419 Anxiety disorder, unspecified: Secondary | ICD-10-CM | POA: Diagnosis not present

## 2023-04-01 DIAGNOSIS — I509 Heart failure, unspecified: Secondary | ICD-10-CM | POA: Diagnosis not present

## 2023-04-01 DIAGNOSIS — Z4800 Encounter for change or removal of nonsurgical wound dressing: Secondary | ICD-10-CM | POA: Diagnosis not present

## 2023-04-01 DIAGNOSIS — N189 Chronic kidney disease, unspecified: Secondary | ICD-10-CM | POA: Diagnosis not present

## 2023-04-01 DIAGNOSIS — J189 Pneumonia, unspecified organism: Secondary | ICD-10-CM | POA: Diagnosis not present

## 2023-04-01 DIAGNOSIS — E559 Vitamin D deficiency, unspecified: Secondary | ICD-10-CM | POA: Diagnosis not present

## 2023-04-01 DIAGNOSIS — R296 Repeated falls: Secondary | ICD-10-CM | POA: Diagnosis not present

## 2023-04-01 DIAGNOSIS — F32A Depression, unspecified: Secondary | ICD-10-CM | POA: Diagnosis not present

## 2023-04-01 DIAGNOSIS — I130 Hypertensive heart and chronic kidney disease with heart failure and stage 1 through stage 4 chronic kidney disease, or unspecified chronic kidney disease: Secondary | ICD-10-CM | POA: Diagnosis not present

## 2023-04-01 DIAGNOSIS — Z556 Problems related to health literacy: Secondary | ICD-10-CM | POA: Diagnosis not present

## 2023-04-01 DIAGNOSIS — E782 Mixed hyperlipidemia: Secondary | ICD-10-CM | POA: Diagnosis not present

## 2023-04-01 DIAGNOSIS — R600 Localized edema: Secondary | ICD-10-CM | POA: Diagnosis not present

## 2023-04-07 DIAGNOSIS — N189 Chronic kidney disease, unspecified: Secondary | ICD-10-CM | POA: Diagnosis not present

## 2023-04-07 DIAGNOSIS — I509 Heart failure, unspecified: Secondary | ICD-10-CM | POA: Diagnosis not present

## 2023-04-07 DIAGNOSIS — I130 Hypertensive heart and chronic kidney disease with heart failure and stage 1 through stage 4 chronic kidney disease, or unspecified chronic kidney disease: Secondary | ICD-10-CM | POA: Diagnosis not present

## 2023-04-07 DIAGNOSIS — Z556 Problems related to health literacy: Secondary | ICD-10-CM | POA: Diagnosis not present

## 2023-04-07 DIAGNOSIS — R296 Repeated falls: Secondary | ICD-10-CM | POA: Diagnosis not present

## 2023-04-07 DIAGNOSIS — J189 Pneumonia, unspecified organism: Secondary | ICD-10-CM | POA: Diagnosis not present

## 2023-04-07 DIAGNOSIS — F419 Anxiety disorder, unspecified: Secondary | ICD-10-CM | POA: Diagnosis not present

## 2023-04-07 DIAGNOSIS — R600 Localized edema: Secondary | ICD-10-CM | POA: Diagnosis not present

## 2023-04-07 DIAGNOSIS — Z4800 Encounter for change or removal of nonsurgical wound dressing: Secondary | ICD-10-CM | POA: Diagnosis not present

## 2023-04-07 DIAGNOSIS — E782 Mixed hyperlipidemia: Secondary | ICD-10-CM | POA: Diagnosis not present

## 2023-04-07 DIAGNOSIS — E559 Vitamin D deficiency, unspecified: Secondary | ICD-10-CM | POA: Diagnosis not present

## 2023-04-07 DIAGNOSIS — F32A Depression, unspecified: Secondary | ICD-10-CM | POA: Diagnosis not present

## 2023-04-12 DIAGNOSIS — J9620 Acute and chronic respiratory failure, unspecified whether with hypoxia or hypercapnia: Secondary | ICD-10-CM | POA: Diagnosis not present

## 2023-04-12 DIAGNOSIS — R0602 Shortness of breath: Secondary | ICD-10-CM | POA: Diagnosis not present

## 2023-04-12 DIAGNOSIS — J9691 Respiratory failure, unspecified with hypoxia: Secondary | ICD-10-CM | POA: Diagnosis not present

## 2023-04-12 DIAGNOSIS — E785 Hyperlipidemia, unspecified: Secondary | ICD-10-CM | POA: Diagnosis not present

## 2023-04-12 DIAGNOSIS — F329 Major depressive disorder, single episode, unspecified: Secondary | ICD-10-CM | POA: Diagnosis not present

## 2023-04-12 DIAGNOSIS — I10 Essential (primary) hypertension: Secondary | ICD-10-CM | POA: Diagnosis not present

## 2023-04-12 DIAGNOSIS — I509 Heart failure, unspecified: Secondary | ICD-10-CM | POA: Diagnosis not present

## 2023-04-13 DIAGNOSIS — F32A Depression, unspecified: Secondary | ICD-10-CM | POA: Diagnosis not present

## 2023-04-13 DIAGNOSIS — J189 Pneumonia, unspecified organism: Secondary | ICD-10-CM | POA: Diagnosis not present

## 2023-04-13 DIAGNOSIS — Z556 Problems related to health literacy: Secondary | ICD-10-CM | POA: Diagnosis not present

## 2023-04-13 DIAGNOSIS — I130 Hypertensive heart and chronic kidney disease with heart failure and stage 1 through stage 4 chronic kidney disease, or unspecified chronic kidney disease: Secondary | ICD-10-CM | POA: Diagnosis not present

## 2023-04-13 DIAGNOSIS — E782 Mixed hyperlipidemia: Secondary | ICD-10-CM | POA: Diagnosis not present

## 2023-04-13 DIAGNOSIS — F419 Anxiety disorder, unspecified: Secondary | ICD-10-CM | POA: Diagnosis not present

## 2023-04-13 DIAGNOSIS — Z4800 Encounter for change or removal of nonsurgical wound dressing: Secondary | ICD-10-CM | POA: Diagnosis not present

## 2023-04-13 DIAGNOSIS — N189 Chronic kidney disease, unspecified: Secondary | ICD-10-CM | POA: Diagnosis not present

## 2023-04-13 DIAGNOSIS — I509 Heart failure, unspecified: Secondary | ICD-10-CM | POA: Diagnosis not present

## 2023-04-13 DIAGNOSIS — R600 Localized edema: Secondary | ICD-10-CM | POA: Diagnosis not present

## 2023-04-13 DIAGNOSIS — R296 Repeated falls: Secondary | ICD-10-CM | POA: Diagnosis not present

## 2023-04-13 DIAGNOSIS — E559 Vitamin D deficiency, unspecified: Secondary | ICD-10-CM | POA: Diagnosis not present

## 2023-04-19 DIAGNOSIS — J189 Pneumonia, unspecified organism: Secondary | ICD-10-CM | POA: Diagnosis not present

## 2023-04-19 DIAGNOSIS — R600 Localized edema: Secondary | ICD-10-CM | POA: Diagnosis not present

## 2023-04-19 DIAGNOSIS — I509 Heart failure, unspecified: Secondary | ICD-10-CM | POA: Diagnosis not present

## 2023-04-19 DIAGNOSIS — I130 Hypertensive heart and chronic kidney disease with heart failure and stage 1 through stage 4 chronic kidney disease, or unspecified chronic kidney disease: Secondary | ICD-10-CM | POA: Diagnosis not present

## 2023-04-19 DIAGNOSIS — N189 Chronic kidney disease, unspecified: Secondary | ICD-10-CM | POA: Diagnosis not present

## 2023-04-19 DIAGNOSIS — F419 Anxiety disorder, unspecified: Secondary | ICD-10-CM | POA: Diagnosis not present

## 2023-04-19 DIAGNOSIS — F32A Depression, unspecified: Secondary | ICD-10-CM | POA: Diagnosis not present

## 2023-04-19 DIAGNOSIS — E559 Vitamin D deficiency, unspecified: Secondary | ICD-10-CM | POA: Diagnosis not present

## 2023-04-19 DIAGNOSIS — R296 Repeated falls: Secondary | ICD-10-CM | POA: Diagnosis not present

## 2023-04-19 DIAGNOSIS — Z4800 Encounter for change or removal of nonsurgical wound dressing: Secondary | ICD-10-CM | POA: Diagnosis not present

## 2023-04-19 DIAGNOSIS — E782 Mixed hyperlipidemia: Secondary | ICD-10-CM | POA: Diagnosis not present

## 2023-04-19 DIAGNOSIS — Z556 Problems related to health literacy: Secondary | ICD-10-CM | POA: Diagnosis not present

## 2023-04-25 DIAGNOSIS — E559 Vitamin D deficiency, unspecified: Secondary | ICD-10-CM | POA: Diagnosis not present

## 2023-04-25 DIAGNOSIS — I110 Hypertensive heart disease with heart failure: Secondary | ICD-10-CM | POA: Diagnosis not present

## 2023-04-25 DIAGNOSIS — K219 Gastro-esophageal reflux disease without esophagitis: Secondary | ICD-10-CM | POA: Diagnosis not present

## 2023-04-29 DIAGNOSIS — Z4800 Encounter for change or removal of nonsurgical wound dressing: Secondary | ICD-10-CM | POA: Diagnosis not present

## 2023-04-29 DIAGNOSIS — I509 Heart failure, unspecified: Secondary | ICD-10-CM | POA: Diagnosis not present

## 2023-04-29 DIAGNOSIS — J189 Pneumonia, unspecified organism: Secondary | ICD-10-CM | POA: Diagnosis not present

## 2023-04-29 DIAGNOSIS — F419 Anxiety disorder, unspecified: Secondary | ICD-10-CM | POA: Diagnosis not present

## 2023-04-29 DIAGNOSIS — F32A Depression, unspecified: Secondary | ICD-10-CM | POA: Diagnosis not present

## 2023-04-29 DIAGNOSIS — N189 Chronic kidney disease, unspecified: Secondary | ICD-10-CM | POA: Diagnosis not present

## 2023-04-29 DIAGNOSIS — Z556 Problems related to health literacy: Secondary | ICD-10-CM | POA: Diagnosis not present

## 2023-04-29 DIAGNOSIS — E559 Vitamin D deficiency, unspecified: Secondary | ICD-10-CM | POA: Diagnosis not present

## 2023-04-29 DIAGNOSIS — R600 Localized edema: Secondary | ICD-10-CM | POA: Diagnosis not present

## 2023-04-29 DIAGNOSIS — R296 Repeated falls: Secondary | ICD-10-CM | POA: Diagnosis not present

## 2023-04-29 DIAGNOSIS — E782 Mixed hyperlipidemia: Secondary | ICD-10-CM | POA: Diagnosis not present

## 2023-04-29 DIAGNOSIS — I130 Hypertensive heart and chronic kidney disease with heart failure and stage 1 through stage 4 chronic kidney disease, or unspecified chronic kidney disease: Secondary | ICD-10-CM | POA: Diagnosis not present

## 2023-05-09 DIAGNOSIS — M79675 Pain in left toe(s): Secondary | ICD-10-CM | POA: Diagnosis not present

## 2023-05-09 DIAGNOSIS — B351 Tinea unguium: Secondary | ICD-10-CM | POA: Diagnosis not present

## 2023-05-09 DIAGNOSIS — M79674 Pain in right toe(s): Secondary | ICD-10-CM | POA: Diagnosis not present

## 2023-05-13 DIAGNOSIS — F329 Major depressive disorder, single episode, unspecified: Secondary | ICD-10-CM | POA: Diagnosis not present

## 2023-05-13 DIAGNOSIS — J449 Chronic obstructive pulmonary disease, unspecified: Secondary | ICD-10-CM | POA: Diagnosis not present

## 2023-05-13 DIAGNOSIS — R0602 Shortness of breath: Secondary | ICD-10-CM | POA: Diagnosis not present

## 2023-05-13 DIAGNOSIS — I10 Essential (primary) hypertension: Secondary | ICD-10-CM | POA: Diagnosis not present

## 2023-05-13 DIAGNOSIS — I509 Heart failure, unspecified: Secondary | ICD-10-CM | POA: Diagnosis not present

## 2023-05-13 DIAGNOSIS — E785 Hyperlipidemia, unspecified: Secondary | ICD-10-CM | POA: Diagnosis not present

## 2023-05-13 DIAGNOSIS — J9691 Respiratory failure, unspecified with hypoxia: Secondary | ICD-10-CM | POA: Diagnosis not present

## 2023-05-20 DIAGNOSIS — K219 Gastro-esophageal reflux disease without esophagitis: Secondary | ICD-10-CM | POA: Diagnosis not present

## 2023-05-20 DIAGNOSIS — E559 Vitamin D deficiency, unspecified: Secondary | ICD-10-CM | POA: Diagnosis not present

## 2023-05-20 DIAGNOSIS — I110 Hypertensive heart disease with heart failure: Secondary | ICD-10-CM | POA: Diagnosis not present

## 2023-05-24 DIAGNOSIS — I509 Heart failure, unspecified: Secondary | ICD-10-CM | POA: Diagnosis not present

## 2023-05-24 DIAGNOSIS — E782 Mixed hyperlipidemia: Secondary | ICD-10-CM | POA: Diagnosis not present

## 2023-05-24 DIAGNOSIS — I110 Hypertensive heart disease with heart failure: Secondary | ICD-10-CM | POA: Diagnosis not present

## 2023-06-13 DIAGNOSIS — J9691 Respiratory failure, unspecified with hypoxia: Secondary | ICD-10-CM | POA: Diagnosis not present

## 2023-06-13 DIAGNOSIS — F329 Major depressive disorder, single episode, unspecified: Secondary | ICD-10-CM | POA: Diagnosis not present

## 2023-06-13 DIAGNOSIS — E785 Hyperlipidemia, unspecified: Secondary | ICD-10-CM | POA: Diagnosis not present

## 2023-06-13 DIAGNOSIS — J449 Chronic obstructive pulmonary disease, unspecified: Secondary | ICD-10-CM | POA: Diagnosis not present

## 2023-06-13 DIAGNOSIS — I10 Essential (primary) hypertension: Secondary | ICD-10-CM | POA: Diagnosis not present

## 2023-06-13 DIAGNOSIS — R0602 Shortness of breath: Secondary | ICD-10-CM | POA: Diagnosis not present

## 2023-06-13 DIAGNOSIS — I509 Heart failure, unspecified: Secondary | ICD-10-CM | POA: Diagnosis not present

## 2023-06-21 DIAGNOSIS — E559 Vitamin D deficiency, unspecified: Secondary | ICD-10-CM | POA: Diagnosis not present

## 2023-06-21 DIAGNOSIS — M6281 Muscle weakness (generalized): Secondary | ICD-10-CM | POA: Diagnosis not present

## 2023-06-21 DIAGNOSIS — K219 Gastro-esophageal reflux disease without esophagitis: Secondary | ICD-10-CM | POA: Diagnosis not present

## 2023-07-04 DIAGNOSIS — R197 Diarrhea, unspecified: Secondary | ICD-10-CM | POA: Diagnosis not present

## 2023-07-07 DIAGNOSIS — S8012XA Contusion of left lower leg, initial encounter: Secondary | ICD-10-CM | POA: Diagnosis not present

## 2023-07-11 DIAGNOSIS — R0602 Shortness of breath: Secondary | ICD-10-CM | POA: Diagnosis not present

## 2023-07-11 DIAGNOSIS — J449 Chronic obstructive pulmonary disease, unspecified: Secondary | ICD-10-CM | POA: Diagnosis not present

## 2023-07-12 DIAGNOSIS — I5042 Chronic combined systolic (congestive) and diastolic (congestive) heart failure: Secondary | ICD-10-CM | POA: Diagnosis not present

## 2023-07-12 DIAGNOSIS — N1832 Chronic kidney disease, stage 3b: Secondary | ICD-10-CM | POA: Diagnosis not present

## 2023-07-12 DIAGNOSIS — S81812D Laceration without foreign body, left lower leg, subsequent encounter: Secondary | ICD-10-CM | POA: Diagnosis not present

## 2023-07-12 DIAGNOSIS — F329 Major depressive disorder, single episode, unspecified: Secondary | ICD-10-CM | POA: Diagnosis not present

## 2023-07-12 DIAGNOSIS — E039 Hypothyroidism, unspecified: Secondary | ICD-10-CM | POA: Diagnosis not present

## 2023-07-12 DIAGNOSIS — Z9981 Dependence on supplemental oxygen: Secondary | ICD-10-CM | POA: Diagnosis not present

## 2023-07-12 DIAGNOSIS — Z556 Problems related to health literacy: Secondary | ICD-10-CM | POA: Diagnosis not present

## 2023-07-12 DIAGNOSIS — M810 Age-related osteoporosis without current pathological fracture: Secondary | ICD-10-CM | POA: Diagnosis not present

## 2023-07-12 DIAGNOSIS — D510 Vitamin B12 deficiency anemia due to intrinsic factor deficiency: Secondary | ICD-10-CM | POA: Diagnosis not present

## 2023-07-12 DIAGNOSIS — Z993 Dependence on wheelchair: Secondary | ICD-10-CM | POA: Diagnosis not present

## 2023-07-12 DIAGNOSIS — F419 Anxiety disorder, unspecified: Secondary | ICD-10-CM | POA: Diagnosis not present

## 2023-07-12 DIAGNOSIS — I130 Hypertensive heart and chronic kidney disease with heart failure and stage 1 through stage 4 chronic kidney disease, or unspecified chronic kidney disease: Secondary | ICD-10-CM | POA: Diagnosis not present

## 2023-07-18 DIAGNOSIS — S81812D Laceration without foreign body, left lower leg, subsequent encounter: Secondary | ICD-10-CM | POA: Diagnosis not present

## 2023-07-18 DIAGNOSIS — Z993 Dependence on wheelchair: Secondary | ICD-10-CM | POA: Diagnosis not present

## 2023-07-18 DIAGNOSIS — R634 Abnormal weight loss: Secondary | ICD-10-CM | POA: Diagnosis not present

## 2023-07-18 DIAGNOSIS — I5042 Chronic combined systolic (congestive) and diastolic (congestive) heart failure: Secondary | ICD-10-CM | POA: Diagnosis not present

## 2023-07-18 DIAGNOSIS — M810 Age-related osteoporosis without current pathological fracture: Secondary | ICD-10-CM | POA: Diagnosis not present

## 2023-07-18 DIAGNOSIS — I130 Hypertensive heart and chronic kidney disease with heart failure and stage 1 through stage 4 chronic kidney disease, or unspecified chronic kidney disease: Secondary | ICD-10-CM | POA: Diagnosis not present

## 2023-07-18 DIAGNOSIS — F329 Major depressive disorder, single episode, unspecified: Secondary | ICD-10-CM | POA: Diagnosis not present

## 2023-07-18 DIAGNOSIS — I509 Heart failure, unspecified: Secondary | ICD-10-CM | POA: Diagnosis not present

## 2023-07-18 DIAGNOSIS — E039 Hypothyroidism, unspecified: Secondary | ICD-10-CM | POA: Diagnosis not present

## 2023-07-18 DIAGNOSIS — Z9981 Dependence on supplemental oxygen: Secondary | ICD-10-CM | POA: Diagnosis not present

## 2023-07-18 DIAGNOSIS — Z556 Problems related to health literacy: Secondary | ICD-10-CM | POA: Diagnosis not present

## 2023-07-18 DIAGNOSIS — D510 Vitamin B12 deficiency anemia due to intrinsic factor deficiency: Secondary | ICD-10-CM | POA: Diagnosis not present

## 2023-07-18 DIAGNOSIS — I110 Hypertensive heart disease with heart failure: Secondary | ICD-10-CM | POA: Diagnosis not present

## 2023-07-18 DIAGNOSIS — N1832 Chronic kidney disease, stage 3b: Secondary | ICD-10-CM | POA: Diagnosis not present

## 2023-07-18 DIAGNOSIS — F419 Anxiety disorder, unspecified: Secondary | ICD-10-CM | POA: Diagnosis not present

## 2023-07-20 DIAGNOSIS — M810 Age-related osteoporosis without current pathological fracture: Secondary | ICD-10-CM | POA: Diagnosis not present

## 2023-07-20 DIAGNOSIS — F329 Major depressive disorder, single episode, unspecified: Secondary | ICD-10-CM | POA: Diagnosis not present

## 2023-07-20 DIAGNOSIS — S81812D Laceration without foreign body, left lower leg, subsequent encounter: Secondary | ICD-10-CM | POA: Diagnosis not present

## 2023-07-20 DIAGNOSIS — I130 Hypertensive heart and chronic kidney disease with heart failure and stage 1 through stage 4 chronic kidney disease, or unspecified chronic kidney disease: Secondary | ICD-10-CM | POA: Diagnosis not present

## 2023-07-20 DIAGNOSIS — F419 Anxiety disorder, unspecified: Secondary | ICD-10-CM | POA: Diagnosis not present

## 2023-07-20 DIAGNOSIS — Z993 Dependence on wheelchair: Secondary | ICD-10-CM | POA: Diagnosis not present

## 2023-07-20 DIAGNOSIS — D510 Vitamin B12 deficiency anemia due to intrinsic factor deficiency: Secondary | ICD-10-CM | POA: Diagnosis not present

## 2023-07-20 DIAGNOSIS — E039 Hypothyroidism, unspecified: Secondary | ICD-10-CM | POA: Diagnosis not present

## 2023-07-20 DIAGNOSIS — Z556 Problems related to health literacy: Secondary | ICD-10-CM | POA: Diagnosis not present

## 2023-07-20 DIAGNOSIS — I5042 Chronic combined systolic (congestive) and diastolic (congestive) heart failure: Secondary | ICD-10-CM | POA: Diagnosis not present

## 2023-07-20 DIAGNOSIS — Z9981 Dependence on supplemental oxygen: Secondary | ICD-10-CM | POA: Diagnosis not present

## 2023-07-20 DIAGNOSIS — N1832 Chronic kidney disease, stage 3b: Secondary | ICD-10-CM | POA: Diagnosis not present

## 2023-07-25 DIAGNOSIS — Z9981 Dependence on supplemental oxygen: Secondary | ICD-10-CM | POA: Diagnosis not present

## 2023-07-25 DIAGNOSIS — M810 Age-related osteoporosis without current pathological fracture: Secondary | ICD-10-CM | POA: Diagnosis not present

## 2023-07-25 DIAGNOSIS — F419 Anxiety disorder, unspecified: Secondary | ICD-10-CM | POA: Diagnosis not present

## 2023-07-25 DIAGNOSIS — I5042 Chronic combined systolic (congestive) and diastolic (congestive) heart failure: Secondary | ICD-10-CM | POA: Diagnosis not present

## 2023-07-25 DIAGNOSIS — M79674 Pain in right toe(s): Secondary | ICD-10-CM | POA: Diagnosis not present

## 2023-07-25 DIAGNOSIS — M79675 Pain in left toe(s): Secondary | ICD-10-CM | POA: Diagnosis not present

## 2023-07-25 DIAGNOSIS — I130 Hypertensive heart and chronic kidney disease with heart failure and stage 1 through stage 4 chronic kidney disease, or unspecified chronic kidney disease: Secondary | ICD-10-CM | POA: Diagnosis not present

## 2023-07-25 DIAGNOSIS — N1832 Chronic kidney disease, stage 3b: Secondary | ICD-10-CM | POA: Diagnosis not present

## 2023-07-25 DIAGNOSIS — S81812D Laceration without foreign body, left lower leg, subsequent encounter: Secondary | ICD-10-CM | POA: Diagnosis not present

## 2023-07-25 DIAGNOSIS — E039 Hypothyroidism, unspecified: Secondary | ICD-10-CM | POA: Diagnosis not present

## 2023-07-25 DIAGNOSIS — Z556 Problems related to health literacy: Secondary | ICD-10-CM | POA: Diagnosis not present

## 2023-07-25 DIAGNOSIS — F329 Major depressive disorder, single episode, unspecified: Secondary | ICD-10-CM | POA: Diagnosis not present

## 2023-07-25 DIAGNOSIS — B351 Tinea unguium: Secondary | ICD-10-CM | POA: Diagnosis not present

## 2023-07-25 DIAGNOSIS — Z993 Dependence on wheelchair: Secondary | ICD-10-CM | POA: Diagnosis not present

## 2023-07-25 DIAGNOSIS — D510 Vitamin B12 deficiency anemia due to intrinsic factor deficiency: Secondary | ICD-10-CM | POA: Diagnosis not present

## 2023-07-27 DIAGNOSIS — Z9981 Dependence on supplemental oxygen: Secondary | ICD-10-CM | POA: Diagnosis not present

## 2023-07-27 DIAGNOSIS — E039 Hypothyroidism, unspecified: Secondary | ICD-10-CM | POA: Diagnosis not present

## 2023-07-27 DIAGNOSIS — Z556 Problems related to health literacy: Secondary | ICD-10-CM | POA: Diagnosis not present

## 2023-07-27 DIAGNOSIS — I130 Hypertensive heart and chronic kidney disease with heart failure and stage 1 through stage 4 chronic kidney disease, or unspecified chronic kidney disease: Secondary | ICD-10-CM | POA: Diagnosis not present

## 2023-07-27 DIAGNOSIS — I10 Essential (primary) hypertension: Secondary | ICD-10-CM | POA: Diagnosis not present

## 2023-07-27 DIAGNOSIS — Z993 Dependence on wheelchair: Secondary | ICD-10-CM | POA: Diagnosis not present

## 2023-07-27 DIAGNOSIS — S81812D Laceration without foreign body, left lower leg, subsequent encounter: Secondary | ICD-10-CM | POA: Diagnosis not present

## 2023-07-27 DIAGNOSIS — I509 Heart failure, unspecified: Secondary | ICD-10-CM | POA: Diagnosis not present

## 2023-07-27 DIAGNOSIS — M810 Age-related osteoporosis without current pathological fracture: Secondary | ICD-10-CM | POA: Diagnosis not present

## 2023-07-27 DIAGNOSIS — N1832 Chronic kidney disease, stage 3b: Secondary | ICD-10-CM | POA: Diagnosis not present

## 2023-07-27 DIAGNOSIS — D510 Vitamin B12 deficiency anemia due to intrinsic factor deficiency: Secondary | ICD-10-CM | POA: Diagnosis not present

## 2023-07-27 DIAGNOSIS — F329 Major depressive disorder, single episode, unspecified: Secondary | ICD-10-CM | POA: Diagnosis not present

## 2023-07-27 DIAGNOSIS — F419 Anxiety disorder, unspecified: Secondary | ICD-10-CM | POA: Diagnosis not present

## 2023-07-27 DIAGNOSIS — I5042 Chronic combined systolic (congestive) and diastolic (congestive) heart failure: Secondary | ICD-10-CM | POA: Diagnosis not present

## 2023-07-29 DIAGNOSIS — Z9981 Dependence on supplemental oxygen: Secondary | ICD-10-CM | POA: Diagnosis not present

## 2023-07-29 DIAGNOSIS — S81812D Laceration without foreign body, left lower leg, subsequent encounter: Secondary | ICD-10-CM | POA: Diagnosis not present

## 2023-07-29 DIAGNOSIS — F329 Major depressive disorder, single episode, unspecified: Secondary | ICD-10-CM | POA: Diagnosis not present

## 2023-07-29 DIAGNOSIS — F419 Anxiety disorder, unspecified: Secondary | ICD-10-CM | POA: Diagnosis not present

## 2023-07-29 DIAGNOSIS — Z556 Problems related to health literacy: Secondary | ICD-10-CM | POA: Diagnosis not present

## 2023-07-29 DIAGNOSIS — I130 Hypertensive heart and chronic kidney disease with heart failure and stage 1 through stage 4 chronic kidney disease, or unspecified chronic kidney disease: Secondary | ICD-10-CM | POA: Diagnosis not present

## 2023-07-29 DIAGNOSIS — M810 Age-related osteoporosis without current pathological fracture: Secondary | ICD-10-CM | POA: Diagnosis not present

## 2023-07-29 DIAGNOSIS — N1832 Chronic kidney disease, stage 3b: Secondary | ICD-10-CM | POA: Diagnosis not present

## 2023-07-29 DIAGNOSIS — D510 Vitamin B12 deficiency anemia due to intrinsic factor deficiency: Secondary | ICD-10-CM | POA: Diagnosis not present

## 2023-07-29 DIAGNOSIS — E039 Hypothyroidism, unspecified: Secondary | ICD-10-CM | POA: Diagnosis not present

## 2023-07-29 DIAGNOSIS — Z993 Dependence on wheelchair: Secondary | ICD-10-CM | POA: Diagnosis not present

## 2023-07-29 DIAGNOSIS — I5042 Chronic combined systolic (congestive) and diastolic (congestive) heart failure: Secondary | ICD-10-CM | POA: Diagnosis not present

## 2023-08-01 DIAGNOSIS — E039 Hypothyroidism, unspecified: Secondary | ICD-10-CM | POA: Diagnosis not present

## 2023-08-01 DIAGNOSIS — S81812D Laceration without foreign body, left lower leg, subsequent encounter: Secondary | ICD-10-CM | POA: Diagnosis not present

## 2023-08-01 DIAGNOSIS — Z556 Problems related to health literacy: Secondary | ICD-10-CM | POA: Diagnosis not present

## 2023-08-01 DIAGNOSIS — F419 Anxiety disorder, unspecified: Secondary | ICD-10-CM | POA: Diagnosis not present

## 2023-08-01 DIAGNOSIS — Z993 Dependence on wheelchair: Secondary | ICD-10-CM | POA: Diagnosis not present

## 2023-08-01 DIAGNOSIS — Z9981 Dependence on supplemental oxygen: Secondary | ICD-10-CM | POA: Diagnosis not present

## 2023-08-01 DIAGNOSIS — F329 Major depressive disorder, single episode, unspecified: Secondary | ICD-10-CM | POA: Diagnosis not present

## 2023-08-01 DIAGNOSIS — I5042 Chronic combined systolic (congestive) and diastolic (congestive) heart failure: Secondary | ICD-10-CM | POA: Diagnosis not present

## 2023-08-01 DIAGNOSIS — I130 Hypertensive heart and chronic kidney disease with heart failure and stage 1 through stage 4 chronic kidney disease, or unspecified chronic kidney disease: Secondary | ICD-10-CM | POA: Diagnosis not present

## 2023-08-01 DIAGNOSIS — N1832 Chronic kidney disease, stage 3b: Secondary | ICD-10-CM | POA: Diagnosis not present

## 2023-08-01 DIAGNOSIS — M810 Age-related osteoporosis without current pathological fracture: Secondary | ICD-10-CM | POA: Diagnosis not present

## 2023-08-01 DIAGNOSIS — D510 Vitamin B12 deficiency anemia due to intrinsic factor deficiency: Secondary | ICD-10-CM | POA: Diagnosis not present

## 2023-08-02 DIAGNOSIS — E039 Hypothyroidism, unspecified: Secondary | ICD-10-CM | POA: Diagnosis not present

## 2023-08-02 DIAGNOSIS — I130 Hypertensive heart and chronic kidney disease with heart failure and stage 1 through stage 4 chronic kidney disease, or unspecified chronic kidney disease: Secondary | ICD-10-CM | POA: Diagnosis not present

## 2023-08-02 DIAGNOSIS — N1832 Chronic kidney disease, stage 3b: Secondary | ICD-10-CM | POA: Diagnosis not present

## 2023-08-02 DIAGNOSIS — S81812D Laceration without foreign body, left lower leg, subsequent encounter: Secondary | ICD-10-CM | POA: Diagnosis not present

## 2023-08-02 DIAGNOSIS — Z556 Problems related to health literacy: Secondary | ICD-10-CM | POA: Diagnosis not present

## 2023-08-02 DIAGNOSIS — M810 Age-related osteoporosis without current pathological fracture: Secondary | ICD-10-CM | POA: Diagnosis not present

## 2023-08-02 DIAGNOSIS — F329 Major depressive disorder, single episode, unspecified: Secondary | ICD-10-CM | POA: Diagnosis not present

## 2023-08-02 DIAGNOSIS — D510 Vitamin B12 deficiency anemia due to intrinsic factor deficiency: Secondary | ICD-10-CM | POA: Diagnosis not present

## 2023-08-02 DIAGNOSIS — I5042 Chronic combined systolic (congestive) and diastolic (congestive) heart failure: Secondary | ICD-10-CM | POA: Diagnosis not present

## 2023-08-02 DIAGNOSIS — Z9981 Dependence on supplemental oxygen: Secondary | ICD-10-CM | POA: Diagnosis not present

## 2023-08-02 DIAGNOSIS — Z993 Dependence on wheelchair: Secondary | ICD-10-CM | POA: Diagnosis not present

## 2023-08-02 DIAGNOSIS — F419 Anxiety disorder, unspecified: Secondary | ICD-10-CM | POA: Diagnosis not present

## 2023-08-05 DIAGNOSIS — S81812D Laceration without foreign body, left lower leg, subsequent encounter: Secondary | ICD-10-CM | POA: Diagnosis not present

## 2023-08-05 DIAGNOSIS — M810 Age-related osteoporosis without current pathological fracture: Secondary | ICD-10-CM | POA: Diagnosis not present

## 2023-08-05 DIAGNOSIS — F329 Major depressive disorder, single episode, unspecified: Secondary | ICD-10-CM | POA: Diagnosis not present

## 2023-08-05 DIAGNOSIS — D510 Vitamin B12 deficiency anemia due to intrinsic factor deficiency: Secondary | ICD-10-CM | POA: Diagnosis not present

## 2023-08-05 DIAGNOSIS — Z9981 Dependence on supplemental oxygen: Secondary | ICD-10-CM | POA: Diagnosis not present

## 2023-08-05 DIAGNOSIS — Z556 Problems related to health literacy: Secondary | ICD-10-CM | POA: Diagnosis not present

## 2023-08-05 DIAGNOSIS — E039 Hypothyroidism, unspecified: Secondary | ICD-10-CM | POA: Diagnosis not present

## 2023-08-05 DIAGNOSIS — I130 Hypertensive heart and chronic kidney disease with heart failure and stage 1 through stage 4 chronic kidney disease, or unspecified chronic kidney disease: Secondary | ICD-10-CM | POA: Diagnosis not present

## 2023-08-05 DIAGNOSIS — Z993 Dependence on wheelchair: Secondary | ICD-10-CM | POA: Diagnosis not present

## 2023-08-05 DIAGNOSIS — N1832 Chronic kidney disease, stage 3b: Secondary | ICD-10-CM | POA: Diagnosis not present

## 2023-08-05 DIAGNOSIS — F419 Anxiety disorder, unspecified: Secondary | ICD-10-CM | POA: Diagnosis not present

## 2023-08-05 DIAGNOSIS — I5042 Chronic combined systolic (congestive) and diastolic (congestive) heart failure: Secondary | ICD-10-CM | POA: Diagnosis not present

## 2023-08-08 DIAGNOSIS — Z993 Dependence on wheelchair: Secondary | ICD-10-CM | POA: Diagnosis not present

## 2023-08-08 DIAGNOSIS — M810 Age-related osteoporosis without current pathological fracture: Secondary | ICD-10-CM | POA: Diagnosis not present

## 2023-08-08 DIAGNOSIS — Z556 Problems related to health literacy: Secondary | ICD-10-CM | POA: Diagnosis not present

## 2023-08-08 DIAGNOSIS — I130 Hypertensive heart and chronic kidney disease with heart failure and stage 1 through stage 4 chronic kidney disease, or unspecified chronic kidney disease: Secondary | ICD-10-CM | POA: Diagnosis not present

## 2023-08-08 DIAGNOSIS — I5042 Chronic combined systolic (congestive) and diastolic (congestive) heart failure: Secondary | ICD-10-CM | POA: Diagnosis not present

## 2023-08-08 DIAGNOSIS — S81812D Laceration without foreign body, left lower leg, subsequent encounter: Secondary | ICD-10-CM | POA: Diagnosis not present

## 2023-08-08 DIAGNOSIS — E039 Hypothyroidism, unspecified: Secondary | ICD-10-CM | POA: Diagnosis not present

## 2023-08-08 DIAGNOSIS — F419 Anxiety disorder, unspecified: Secondary | ICD-10-CM | POA: Diagnosis not present

## 2023-08-08 DIAGNOSIS — F329 Major depressive disorder, single episode, unspecified: Secondary | ICD-10-CM | POA: Diagnosis not present

## 2023-08-08 DIAGNOSIS — Z9981 Dependence on supplemental oxygen: Secondary | ICD-10-CM | POA: Diagnosis not present

## 2023-08-08 DIAGNOSIS — D510 Vitamin B12 deficiency anemia due to intrinsic factor deficiency: Secondary | ICD-10-CM | POA: Diagnosis not present

## 2023-08-08 DIAGNOSIS — N1832 Chronic kidney disease, stage 3b: Secondary | ICD-10-CM | POA: Diagnosis not present

## 2023-08-11 DIAGNOSIS — R0602 Shortness of breath: Secondary | ICD-10-CM | POA: Diagnosis not present

## 2023-08-11 DIAGNOSIS — Z993 Dependence on wheelchair: Secondary | ICD-10-CM | POA: Diagnosis not present

## 2023-08-11 DIAGNOSIS — I130 Hypertensive heart and chronic kidney disease with heart failure and stage 1 through stage 4 chronic kidney disease, or unspecified chronic kidney disease: Secondary | ICD-10-CM | POA: Diagnosis not present

## 2023-08-11 DIAGNOSIS — J449 Chronic obstructive pulmonary disease, unspecified: Secondary | ICD-10-CM | POA: Diagnosis not present

## 2023-08-11 DIAGNOSIS — F329 Major depressive disorder, single episode, unspecified: Secondary | ICD-10-CM | POA: Diagnosis not present

## 2023-08-11 DIAGNOSIS — Z556 Problems related to health literacy: Secondary | ICD-10-CM | POA: Diagnosis not present

## 2023-08-11 DIAGNOSIS — I5042 Chronic combined systolic (congestive) and diastolic (congestive) heart failure: Secondary | ICD-10-CM | POA: Diagnosis not present

## 2023-08-11 DIAGNOSIS — N1832 Chronic kidney disease, stage 3b: Secondary | ICD-10-CM | POA: Diagnosis not present

## 2023-08-11 DIAGNOSIS — D510 Vitamin B12 deficiency anemia due to intrinsic factor deficiency: Secondary | ICD-10-CM | POA: Diagnosis not present

## 2023-08-11 DIAGNOSIS — M810 Age-related osteoporosis without current pathological fracture: Secondary | ICD-10-CM | POA: Diagnosis not present

## 2023-08-11 DIAGNOSIS — E039 Hypothyroidism, unspecified: Secondary | ICD-10-CM | POA: Diagnosis not present

## 2023-08-11 DIAGNOSIS — F419 Anxiety disorder, unspecified: Secondary | ICD-10-CM | POA: Diagnosis not present

## 2023-08-11 DIAGNOSIS — Z9981 Dependence on supplemental oxygen: Secondary | ICD-10-CM | POA: Diagnosis not present

## 2023-08-11 DIAGNOSIS — S81812D Laceration without foreign body, left lower leg, subsequent encounter: Secondary | ICD-10-CM | POA: Diagnosis not present

## 2023-08-15 DIAGNOSIS — E559 Vitamin D deficiency, unspecified: Secondary | ICD-10-CM | POA: Diagnosis not present

## 2023-08-15 DIAGNOSIS — R634 Abnormal weight loss: Secondary | ICD-10-CM | POA: Diagnosis not present

## 2023-08-15 DIAGNOSIS — M6281 Muscle weakness (generalized): Secondary | ICD-10-CM | POA: Diagnosis not present

## 2023-08-17 DIAGNOSIS — Z9981 Dependence on supplemental oxygen: Secondary | ICD-10-CM | POA: Diagnosis not present

## 2023-08-17 DIAGNOSIS — D510 Vitamin B12 deficiency anemia due to intrinsic factor deficiency: Secondary | ICD-10-CM | POA: Diagnosis not present

## 2023-08-17 DIAGNOSIS — E039 Hypothyroidism, unspecified: Secondary | ICD-10-CM | POA: Diagnosis not present

## 2023-08-17 DIAGNOSIS — I130 Hypertensive heart and chronic kidney disease with heart failure and stage 1 through stage 4 chronic kidney disease, or unspecified chronic kidney disease: Secondary | ICD-10-CM | POA: Diagnosis not present

## 2023-08-17 DIAGNOSIS — F329 Major depressive disorder, single episode, unspecified: Secondary | ICD-10-CM | POA: Diagnosis not present

## 2023-08-17 DIAGNOSIS — Z993 Dependence on wheelchair: Secondary | ICD-10-CM | POA: Diagnosis not present

## 2023-08-17 DIAGNOSIS — N1832 Chronic kidney disease, stage 3b: Secondary | ICD-10-CM | POA: Diagnosis not present

## 2023-08-17 DIAGNOSIS — F419 Anxiety disorder, unspecified: Secondary | ICD-10-CM | POA: Diagnosis not present

## 2023-08-17 DIAGNOSIS — Z556 Problems related to health literacy: Secondary | ICD-10-CM | POA: Diagnosis not present

## 2023-08-17 DIAGNOSIS — S81812D Laceration without foreign body, left lower leg, subsequent encounter: Secondary | ICD-10-CM | POA: Diagnosis not present

## 2023-08-17 DIAGNOSIS — M810 Age-related osteoporosis without current pathological fracture: Secondary | ICD-10-CM | POA: Diagnosis not present

## 2023-08-17 DIAGNOSIS — I5042 Chronic combined systolic (congestive) and diastolic (congestive) heart failure: Secondary | ICD-10-CM | POA: Diagnosis not present

## 2023-08-19 DIAGNOSIS — K219 Gastro-esophageal reflux disease without esophagitis: Secondary | ICD-10-CM | POA: Diagnosis not present

## 2023-08-19 DIAGNOSIS — E559 Vitamin D deficiency, unspecified: Secondary | ICD-10-CM | POA: Diagnosis not present

## 2023-08-19 DIAGNOSIS — I10 Essential (primary) hypertension: Secondary | ICD-10-CM | POA: Diagnosis not present

## 2023-08-22 DIAGNOSIS — D510 Vitamin B12 deficiency anemia due to intrinsic factor deficiency: Secondary | ICD-10-CM | POA: Diagnosis not present

## 2023-08-22 DIAGNOSIS — Z556 Problems related to health literacy: Secondary | ICD-10-CM | POA: Diagnosis not present

## 2023-08-22 DIAGNOSIS — Z993 Dependence on wheelchair: Secondary | ICD-10-CM | POA: Diagnosis not present

## 2023-08-22 DIAGNOSIS — I130 Hypertensive heart and chronic kidney disease with heart failure and stage 1 through stage 4 chronic kidney disease, or unspecified chronic kidney disease: Secondary | ICD-10-CM | POA: Diagnosis not present

## 2023-08-22 DIAGNOSIS — I5042 Chronic combined systolic (congestive) and diastolic (congestive) heart failure: Secondary | ICD-10-CM | POA: Diagnosis not present

## 2023-08-22 DIAGNOSIS — F419 Anxiety disorder, unspecified: Secondary | ICD-10-CM | POA: Diagnosis not present

## 2023-08-22 DIAGNOSIS — S81812D Laceration without foreign body, left lower leg, subsequent encounter: Secondary | ICD-10-CM | POA: Diagnosis not present

## 2023-08-22 DIAGNOSIS — F329 Major depressive disorder, single episode, unspecified: Secondary | ICD-10-CM | POA: Diagnosis not present

## 2023-08-22 DIAGNOSIS — E039 Hypothyroidism, unspecified: Secondary | ICD-10-CM | POA: Diagnosis not present

## 2023-08-22 DIAGNOSIS — N1832 Chronic kidney disease, stage 3b: Secondary | ICD-10-CM | POA: Diagnosis not present

## 2023-08-22 DIAGNOSIS — M810 Age-related osteoporosis without current pathological fracture: Secondary | ICD-10-CM | POA: Diagnosis not present

## 2023-08-22 DIAGNOSIS — Z9981 Dependence on supplemental oxygen: Secondary | ICD-10-CM | POA: Diagnosis not present

## 2023-08-24 DIAGNOSIS — I5042 Chronic combined systolic (congestive) and diastolic (congestive) heart failure: Secondary | ICD-10-CM | POA: Diagnosis not present

## 2023-08-24 DIAGNOSIS — I130 Hypertensive heart and chronic kidney disease with heart failure and stage 1 through stage 4 chronic kidney disease, or unspecified chronic kidney disease: Secondary | ICD-10-CM | POA: Diagnosis not present

## 2023-08-30 DIAGNOSIS — I5042 Chronic combined systolic (congestive) and diastolic (congestive) heart failure: Secondary | ICD-10-CM | POA: Diagnosis not present

## 2023-08-30 DIAGNOSIS — I10 Essential (primary) hypertension: Secondary | ICD-10-CM | POA: Diagnosis not present

## 2023-08-30 DIAGNOSIS — F419 Anxiety disorder, unspecified: Secondary | ICD-10-CM | POA: Diagnosis not present

## 2023-09-01 DIAGNOSIS — E039 Hypothyroidism, unspecified: Secondary | ICD-10-CM | POA: Diagnosis not present

## 2023-09-01 DIAGNOSIS — F329 Major depressive disorder, single episode, unspecified: Secondary | ICD-10-CM | POA: Diagnosis not present

## 2023-09-01 DIAGNOSIS — Z9981 Dependence on supplemental oxygen: Secondary | ICD-10-CM | POA: Diagnosis not present

## 2023-09-01 DIAGNOSIS — Z993 Dependence on wheelchair: Secondary | ICD-10-CM | POA: Diagnosis not present

## 2023-09-01 DIAGNOSIS — N1832 Chronic kidney disease, stage 3b: Secondary | ICD-10-CM | POA: Diagnosis not present

## 2023-09-01 DIAGNOSIS — S81812D Laceration without foreign body, left lower leg, subsequent encounter: Secondary | ICD-10-CM | POA: Diagnosis not present

## 2023-09-01 DIAGNOSIS — M810 Age-related osteoporosis without current pathological fracture: Secondary | ICD-10-CM | POA: Diagnosis not present

## 2023-09-01 DIAGNOSIS — F419 Anxiety disorder, unspecified: Secondary | ICD-10-CM | POA: Diagnosis not present

## 2023-09-01 DIAGNOSIS — D510 Vitamin B12 deficiency anemia due to intrinsic factor deficiency: Secondary | ICD-10-CM | POA: Diagnosis not present

## 2023-09-01 DIAGNOSIS — Z556 Problems related to health literacy: Secondary | ICD-10-CM | POA: Diagnosis not present

## 2023-09-01 DIAGNOSIS — I130 Hypertensive heart and chronic kidney disease with heart failure and stage 1 through stage 4 chronic kidney disease, or unspecified chronic kidney disease: Secondary | ICD-10-CM | POA: Diagnosis not present

## 2023-09-01 DIAGNOSIS — I5042 Chronic combined systolic (congestive) and diastolic (congestive) heart failure: Secondary | ICD-10-CM | POA: Diagnosis not present

## 2023-09-08 DIAGNOSIS — M810 Age-related osteoporosis without current pathological fracture: Secondary | ICD-10-CM | POA: Diagnosis not present

## 2023-09-08 DIAGNOSIS — I5042 Chronic combined systolic (congestive) and diastolic (congestive) heart failure: Secondary | ICD-10-CM | POA: Diagnosis not present

## 2023-09-08 DIAGNOSIS — E039 Hypothyroidism, unspecified: Secondary | ICD-10-CM | POA: Diagnosis not present

## 2023-09-08 DIAGNOSIS — F329 Major depressive disorder, single episode, unspecified: Secondary | ICD-10-CM | POA: Diagnosis not present

## 2023-09-08 DIAGNOSIS — Z9981 Dependence on supplemental oxygen: Secondary | ICD-10-CM | POA: Diagnosis not present

## 2023-09-08 DIAGNOSIS — S81812D Laceration without foreign body, left lower leg, subsequent encounter: Secondary | ICD-10-CM | POA: Diagnosis not present

## 2023-09-08 DIAGNOSIS — D510 Vitamin B12 deficiency anemia due to intrinsic factor deficiency: Secondary | ICD-10-CM | POA: Diagnosis not present

## 2023-09-08 DIAGNOSIS — I130 Hypertensive heart and chronic kidney disease with heart failure and stage 1 through stage 4 chronic kidney disease, or unspecified chronic kidney disease: Secondary | ICD-10-CM | POA: Diagnosis not present

## 2023-09-08 DIAGNOSIS — Z993 Dependence on wheelchair: Secondary | ICD-10-CM | POA: Diagnosis not present

## 2023-09-08 DIAGNOSIS — Z556 Problems related to health literacy: Secondary | ICD-10-CM | POA: Diagnosis not present

## 2023-09-08 DIAGNOSIS — F419 Anxiety disorder, unspecified: Secondary | ICD-10-CM | POA: Diagnosis not present

## 2023-09-08 DIAGNOSIS — N1832 Chronic kidney disease, stage 3b: Secondary | ICD-10-CM | POA: Diagnosis not present

## 2023-09-10 DIAGNOSIS — F419 Anxiety disorder, unspecified: Secondary | ICD-10-CM | POA: Diagnosis not present

## 2023-09-10 DIAGNOSIS — I130 Hypertensive heart and chronic kidney disease with heart failure and stage 1 through stage 4 chronic kidney disease, or unspecified chronic kidney disease: Secondary | ICD-10-CM | POA: Diagnosis not present

## 2023-09-10 DIAGNOSIS — Z9981 Dependence on supplemental oxygen: Secondary | ICD-10-CM | POA: Diagnosis not present

## 2023-09-10 DIAGNOSIS — N1832 Chronic kidney disease, stage 3b: Secondary | ICD-10-CM | POA: Diagnosis not present

## 2023-09-10 DIAGNOSIS — M810 Age-related osteoporosis without current pathological fracture: Secondary | ICD-10-CM | POA: Diagnosis not present

## 2023-09-10 DIAGNOSIS — D510 Vitamin B12 deficiency anemia due to intrinsic factor deficiency: Secondary | ICD-10-CM | POA: Diagnosis not present

## 2023-09-10 DIAGNOSIS — Z556 Problems related to health literacy: Secondary | ICD-10-CM | POA: Diagnosis not present

## 2023-09-10 DIAGNOSIS — J449 Chronic obstructive pulmonary disease, unspecified: Secondary | ICD-10-CM | POA: Diagnosis not present

## 2023-09-10 DIAGNOSIS — I5042 Chronic combined systolic (congestive) and diastolic (congestive) heart failure: Secondary | ICD-10-CM | POA: Diagnosis not present

## 2023-09-10 DIAGNOSIS — R0602 Shortness of breath: Secondary | ICD-10-CM | POA: Diagnosis not present

## 2023-09-10 DIAGNOSIS — F329 Major depressive disorder, single episode, unspecified: Secondary | ICD-10-CM | POA: Diagnosis not present

## 2023-09-10 DIAGNOSIS — E039 Hypothyroidism, unspecified: Secondary | ICD-10-CM | POA: Diagnosis not present

## 2023-09-12 DIAGNOSIS — F419 Anxiety disorder, unspecified: Secondary | ICD-10-CM | POA: Diagnosis not present

## 2023-09-12 DIAGNOSIS — N1832 Chronic kidney disease, stage 3b: Secondary | ICD-10-CM | POA: Diagnosis not present

## 2023-09-12 DIAGNOSIS — Z9981 Dependence on supplemental oxygen: Secondary | ICD-10-CM | POA: Diagnosis not present

## 2023-09-12 DIAGNOSIS — M810 Age-related osteoporosis without current pathological fracture: Secondary | ICD-10-CM | POA: Diagnosis not present

## 2023-09-12 DIAGNOSIS — Z556 Problems related to health literacy: Secondary | ICD-10-CM | POA: Diagnosis not present

## 2023-09-12 DIAGNOSIS — D510 Vitamin B12 deficiency anemia due to intrinsic factor deficiency: Secondary | ICD-10-CM | POA: Diagnosis not present

## 2023-09-12 DIAGNOSIS — K649 Unspecified hemorrhoids: Secondary | ICD-10-CM | POA: Diagnosis not present

## 2023-09-12 DIAGNOSIS — E039 Hypothyroidism, unspecified: Secondary | ICD-10-CM | POA: Diagnosis not present

## 2023-09-12 DIAGNOSIS — F329 Major depressive disorder, single episode, unspecified: Secondary | ICD-10-CM | POA: Diagnosis not present

## 2023-09-12 DIAGNOSIS — I5042 Chronic combined systolic (congestive) and diastolic (congestive) heart failure: Secondary | ICD-10-CM | POA: Diagnosis not present

## 2023-09-12 DIAGNOSIS — I130 Hypertensive heart and chronic kidney disease with heart failure and stage 1 through stage 4 chronic kidney disease, or unspecified chronic kidney disease: Secondary | ICD-10-CM | POA: Diagnosis not present

## 2023-09-13 DIAGNOSIS — F329 Major depressive disorder, single episode, unspecified: Secondary | ICD-10-CM | POA: Diagnosis not present

## 2023-09-13 DIAGNOSIS — F419 Anxiety disorder, unspecified: Secondary | ICD-10-CM | POA: Diagnosis not present

## 2023-09-20 DIAGNOSIS — I5042 Chronic combined systolic (congestive) and diastolic (congestive) heart failure: Secondary | ICD-10-CM | POA: Diagnosis not present

## 2023-09-20 DIAGNOSIS — M810 Age-related osteoporosis without current pathological fracture: Secondary | ICD-10-CM | POA: Diagnosis not present

## 2023-09-20 DIAGNOSIS — N1832 Chronic kidney disease, stage 3b: Secondary | ICD-10-CM | POA: Diagnosis not present

## 2023-09-20 DIAGNOSIS — I130 Hypertensive heart and chronic kidney disease with heart failure and stage 1 through stage 4 chronic kidney disease, or unspecified chronic kidney disease: Secondary | ICD-10-CM | POA: Diagnosis not present

## 2023-09-20 DIAGNOSIS — F419 Anxiety disorder, unspecified: Secondary | ICD-10-CM | POA: Diagnosis not present

## 2023-09-20 DIAGNOSIS — Z9981 Dependence on supplemental oxygen: Secondary | ICD-10-CM | POA: Diagnosis not present

## 2023-09-20 DIAGNOSIS — Z556 Problems related to health literacy: Secondary | ICD-10-CM | POA: Diagnosis not present

## 2023-09-20 DIAGNOSIS — E039 Hypothyroidism, unspecified: Secondary | ICD-10-CM | POA: Diagnosis not present

## 2023-09-20 DIAGNOSIS — F329 Major depressive disorder, single episode, unspecified: Secondary | ICD-10-CM | POA: Diagnosis not present

## 2023-09-20 DIAGNOSIS — D510 Vitamin B12 deficiency anemia due to intrinsic factor deficiency: Secondary | ICD-10-CM | POA: Diagnosis not present

## 2023-09-22 DIAGNOSIS — I5042 Chronic combined systolic (congestive) and diastolic (congestive) heart failure: Secondary | ICD-10-CM | POA: Diagnosis not present

## 2023-09-22 DIAGNOSIS — D649 Anemia, unspecified: Secondary | ICD-10-CM | POA: Diagnosis not present

## 2023-09-27 DIAGNOSIS — D510 Vitamin B12 deficiency anemia due to intrinsic factor deficiency: Secondary | ICD-10-CM | POA: Diagnosis not present

## 2023-09-27 DIAGNOSIS — E039 Hypothyroidism, unspecified: Secondary | ICD-10-CM | POA: Diagnosis not present

## 2023-09-27 DIAGNOSIS — F419 Anxiety disorder, unspecified: Secondary | ICD-10-CM | POA: Diagnosis not present

## 2023-09-27 DIAGNOSIS — I130 Hypertensive heart and chronic kidney disease with heart failure and stage 1 through stage 4 chronic kidney disease, or unspecified chronic kidney disease: Secondary | ICD-10-CM | POA: Diagnosis not present

## 2023-09-27 DIAGNOSIS — Z9981 Dependence on supplemental oxygen: Secondary | ICD-10-CM | POA: Diagnosis not present

## 2023-09-27 DIAGNOSIS — F329 Major depressive disorder, single episode, unspecified: Secondary | ICD-10-CM | POA: Diagnosis not present

## 2023-09-27 DIAGNOSIS — I5042 Chronic combined systolic (congestive) and diastolic (congestive) heart failure: Secondary | ICD-10-CM | POA: Diagnosis not present

## 2023-09-27 DIAGNOSIS — M810 Age-related osteoporosis without current pathological fracture: Secondary | ICD-10-CM | POA: Diagnosis not present

## 2023-09-27 DIAGNOSIS — Z556 Problems related to health literacy: Secondary | ICD-10-CM | POA: Diagnosis not present

## 2023-09-27 DIAGNOSIS — N1832 Chronic kidney disease, stage 3b: Secondary | ICD-10-CM | POA: Diagnosis not present

## 2023-10-03 DIAGNOSIS — I130 Hypertensive heart and chronic kidney disease with heart failure and stage 1 through stage 4 chronic kidney disease, or unspecified chronic kidney disease: Secondary | ICD-10-CM | POA: Diagnosis not present

## 2023-10-03 DIAGNOSIS — N1832 Chronic kidney disease, stage 3b: Secondary | ICD-10-CM | POA: Diagnosis not present

## 2023-10-03 DIAGNOSIS — I5042 Chronic combined systolic (congestive) and diastolic (congestive) heart failure: Secondary | ICD-10-CM | POA: Diagnosis not present

## 2023-10-05 DIAGNOSIS — F419 Anxiety disorder, unspecified: Secondary | ICD-10-CM | POA: Diagnosis not present

## 2023-10-05 DIAGNOSIS — I130 Hypertensive heart and chronic kidney disease with heart failure and stage 1 through stage 4 chronic kidney disease, or unspecified chronic kidney disease: Secondary | ICD-10-CM | POA: Diagnosis not present

## 2023-10-05 DIAGNOSIS — M810 Age-related osteoporosis without current pathological fracture: Secondary | ICD-10-CM | POA: Diagnosis not present

## 2023-10-05 DIAGNOSIS — Z9981 Dependence on supplemental oxygen: Secondary | ICD-10-CM | POA: Diagnosis not present

## 2023-10-05 DIAGNOSIS — I5042 Chronic combined systolic (congestive) and diastolic (congestive) heart failure: Secondary | ICD-10-CM | POA: Diagnosis not present

## 2023-10-05 DIAGNOSIS — Z556 Problems related to health literacy: Secondary | ICD-10-CM | POA: Diagnosis not present

## 2023-10-05 DIAGNOSIS — E039 Hypothyroidism, unspecified: Secondary | ICD-10-CM | POA: Diagnosis not present

## 2023-10-05 DIAGNOSIS — D510 Vitamin B12 deficiency anemia due to intrinsic factor deficiency: Secondary | ICD-10-CM | POA: Diagnosis not present

## 2023-10-05 DIAGNOSIS — F329 Major depressive disorder, single episode, unspecified: Secondary | ICD-10-CM | POA: Diagnosis not present

## 2023-10-05 DIAGNOSIS — N1832 Chronic kidney disease, stage 3b: Secondary | ICD-10-CM | POA: Diagnosis not present

## 2023-10-07 DIAGNOSIS — L03115 Cellulitis of right lower limb: Secondary | ICD-10-CM | POA: Diagnosis not present

## 2023-10-10 DIAGNOSIS — F419 Anxiety disorder, unspecified: Secondary | ICD-10-CM | POA: Diagnosis not present

## 2023-10-10 DIAGNOSIS — Z556 Problems related to health literacy: Secondary | ICD-10-CM | POA: Diagnosis not present

## 2023-10-10 DIAGNOSIS — F329 Major depressive disorder, single episode, unspecified: Secondary | ICD-10-CM | POA: Diagnosis not present

## 2023-10-10 DIAGNOSIS — M810 Age-related osteoporosis without current pathological fracture: Secondary | ICD-10-CM | POA: Diagnosis not present

## 2023-10-10 DIAGNOSIS — N1832 Chronic kidney disease, stage 3b: Secondary | ICD-10-CM | POA: Diagnosis not present

## 2023-10-10 DIAGNOSIS — D649 Anemia, unspecified: Secondary | ICD-10-CM | POA: Diagnosis not present

## 2023-10-10 DIAGNOSIS — E039 Hypothyroidism, unspecified: Secondary | ICD-10-CM | POA: Diagnosis not present

## 2023-10-10 DIAGNOSIS — E559 Vitamin D deficiency, unspecified: Secondary | ICD-10-CM | POA: Diagnosis not present

## 2023-10-10 DIAGNOSIS — I5042 Chronic combined systolic (congestive) and diastolic (congestive) heart failure: Secondary | ICD-10-CM | POA: Diagnosis not present

## 2023-10-10 DIAGNOSIS — Z9981 Dependence on supplemental oxygen: Secondary | ICD-10-CM | POA: Diagnosis not present

## 2023-10-10 DIAGNOSIS — I130 Hypertensive heart and chronic kidney disease with heart failure and stage 1 through stage 4 chronic kidney disease, or unspecified chronic kidney disease: Secondary | ICD-10-CM | POA: Diagnosis not present

## 2023-10-10 DIAGNOSIS — D510 Vitamin B12 deficiency anemia due to intrinsic factor deficiency: Secondary | ICD-10-CM | POA: Diagnosis not present

## 2023-10-10 DIAGNOSIS — S81801A Unspecified open wound, right lower leg, initial encounter: Secondary | ICD-10-CM | POA: Diagnosis not present

## 2023-10-11 DIAGNOSIS — I5042 Chronic combined systolic (congestive) and diastolic (congestive) heart failure: Secondary | ICD-10-CM | POA: Diagnosis not present

## 2023-10-11 DIAGNOSIS — E039 Hypothyroidism, unspecified: Secondary | ICD-10-CM | POA: Diagnosis not present

## 2023-10-11 DIAGNOSIS — F419 Anxiety disorder, unspecified: Secondary | ICD-10-CM | POA: Diagnosis not present

## 2023-10-11 DIAGNOSIS — N1832 Chronic kidney disease, stage 3b: Secondary | ICD-10-CM | POA: Diagnosis not present

## 2023-10-11 DIAGNOSIS — F329 Major depressive disorder, single episode, unspecified: Secondary | ICD-10-CM | POA: Diagnosis not present

## 2023-10-11 DIAGNOSIS — Z9981 Dependence on supplemental oxygen: Secondary | ICD-10-CM | POA: Diagnosis not present

## 2023-10-11 DIAGNOSIS — M810 Age-related osteoporosis without current pathological fracture: Secondary | ICD-10-CM | POA: Diagnosis not present

## 2023-10-11 DIAGNOSIS — I130 Hypertensive heart and chronic kidney disease with heart failure and stage 1 through stage 4 chronic kidney disease, or unspecified chronic kidney disease: Secondary | ICD-10-CM | POA: Diagnosis not present

## 2023-10-11 DIAGNOSIS — D510 Vitamin B12 deficiency anemia due to intrinsic factor deficiency: Secondary | ICD-10-CM | POA: Diagnosis not present

## 2023-10-11 DIAGNOSIS — J449 Chronic obstructive pulmonary disease, unspecified: Secondary | ICD-10-CM | POA: Diagnosis not present

## 2023-10-11 DIAGNOSIS — Z556 Problems related to health literacy: Secondary | ICD-10-CM | POA: Diagnosis not present

## 2023-10-11 DIAGNOSIS — R0602 Shortness of breath: Secondary | ICD-10-CM | POA: Diagnosis not present

## 2023-10-17 DIAGNOSIS — S81801A Unspecified open wound, right lower leg, initial encounter: Secondary | ICD-10-CM | POA: Diagnosis not present

## 2023-10-19 DIAGNOSIS — I130 Hypertensive heart and chronic kidney disease with heart failure and stage 1 through stage 4 chronic kidney disease, or unspecified chronic kidney disease: Secondary | ICD-10-CM | POA: Diagnosis not present

## 2023-10-19 DIAGNOSIS — I5042 Chronic combined systolic (congestive) and diastolic (congestive) heart failure: Secondary | ICD-10-CM | POA: Diagnosis not present

## 2023-10-19 DIAGNOSIS — M810 Age-related osteoporosis without current pathological fracture: Secondary | ICD-10-CM | POA: Diagnosis not present

## 2023-10-19 DIAGNOSIS — F329 Major depressive disorder, single episode, unspecified: Secondary | ICD-10-CM | POA: Diagnosis not present

## 2023-10-19 DIAGNOSIS — E039 Hypothyroidism, unspecified: Secondary | ICD-10-CM | POA: Diagnosis not present

## 2023-10-19 DIAGNOSIS — D510 Vitamin B12 deficiency anemia due to intrinsic factor deficiency: Secondary | ICD-10-CM | POA: Diagnosis not present

## 2023-10-19 DIAGNOSIS — F419 Anxiety disorder, unspecified: Secondary | ICD-10-CM | POA: Diagnosis not present

## 2023-10-19 DIAGNOSIS — Z556 Problems related to health literacy: Secondary | ICD-10-CM | POA: Diagnosis not present

## 2023-10-19 DIAGNOSIS — Z9981 Dependence on supplemental oxygen: Secondary | ICD-10-CM | POA: Diagnosis not present

## 2023-10-19 DIAGNOSIS — N1832 Chronic kidney disease, stage 3b: Secondary | ICD-10-CM | POA: Diagnosis not present

## 2023-10-25 DIAGNOSIS — F329 Major depressive disorder, single episode, unspecified: Secondary | ICD-10-CM | POA: Diagnosis not present

## 2023-10-25 DIAGNOSIS — F419 Anxiety disorder, unspecified: Secondary | ICD-10-CM | POA: Diagnosis not present

## 2023-10-27 DIAGNOSIS — F419 Anxiety disorder, unspecified: Secondary | ICD-10-CM | POA: Diagnosis not present

## 2023-10-27 DIAGNOSIS — Z556 Problems related to health literacy: Secondary | ICD-10-CM | POA: Diagnosis not present

## 2023-10-27 DIAGNOSIS — Z9981 Dependence on supplemental oxygen: Secondary | ICD-10-CM | POA: Diagnosis not present

## 2023-10-27 DIAGNOSIS — E039 Hypothyroidism, unspecified: Secondary | ICD-10-CM | POA: Diagnosis not present

## 2023-10-27 DIAGNOSIS — D510 Vitamin B12 deficiency anemia due to intrinsic factor deficiency: Secondary | ICD-10-CM | POA: Diagnosis not present

## 2023-10-27 DIAGNOSIS — I130 Hypertensive heart and chronic kidney disease with heart failure and stage 1 through stage 4 chronic kidney disease, or unspecified chronic kidney disease: Secondary | ICD-10-CM | POA: Diagnosis not present

## 2023-10-27 DIAGNOSIS — N1832 Chronic kidney disease, stage 3b: Secondary | ICD-10-CM | POA: Diagnosis not present

## 2023-10-27 DIAGNOSIS — M810 Age-related osteoporosis without current pathological fracture: Secondary | ICD-10-CM | POA: Diagnosis not present

## 2023-10-27 DIAGNOSIS — I5042 Chronic combined systolic (congestive) and diastolic (congestive) heart failure: Secondary | ICD-10-CM | POA: Diagnosis not present

## 2023-10-27 DIAGNOSIS — F329 Major depressive disorder, single episode, unspecified: Secondary | ICD-10-CM | POA: Diagnosis not present

## 2023-10-29 ENCOUNTER — Emergency Department (HOSPITAL_COMMUNITY)
Admission: EM | Admit: 2023-10-29 | Discharge: 2023-10-30 | Disposition: A | Discharge status: Skilled Nursing Facility | Attending: Emergency Medicine | Admitting: Emergency Medicine

## 2023-10-29 ENCOUNTER — Emergency Department (HOSPITAL_COMMUNITY)

## 2023-10-29 DIAGNOSIS — S8002XA Contusion of left knee, initial encounter: Secondary | ICD-10-CM | POA: Insufficient documentation

## 2023-10-29 DIAGNOSIS — M25561 Pain in right knee: Secondary | ICD-10-CM | POA: Diagnosis not present

## 2023-10-29 DIAGNOSIS — S0121XA Laceration without foreign body of nose, initial encounter: Secondary | ICD-10-CM | POA: Diagnosis not present

## 2023-10-29 DIAGNOSIS — S0181XA Laceration without foreign body of other part of head, initial encounter: Secondary | ICD-10-CM | POA: Insufficient documentation

## 2023-10-29 DIAGNOSIS — I6782 Cerebral ischemia: Secondary | ICD-10-CM | POA: Diagnosis not present

## 2023-10-29 DIAGNOSIS — Z23 Encounter for immunization: Secondary | ICD-10-CM | POA: Insufficient documentation

## 2023-10-29 DIAGNOSIS — M25572 Pain in left ankle and joints of left foot: Secondary | ICD-10-CM | POA: Diagnosis not present

## 2023-10-29 DIAGNOSIS — M1712 Unilateral primary osteoarthritis, left knee: Secondary | ICD-10-CM | POA: Diagnosis not present

## 2023-10-29 DIAGNOSIS — M4312 Spondylolisthesis, cervical region: Secondary | ICD-10-CM | POA: Diagnosis not present

## 2023-10-29 DIAGNOSIS — R079 Chest pain, unspecified: Secondary | ICD-10-CM | POA: Diagnosis not present

## 2023-10-29 DIAGNOSIS — W050XXA Fall from non-moving wheelchair, initial encounter: Secondary | ICD-10-CM | POA: Diagnosis not present

## 2023-10-29 DIAGNOSIS — W19XXXA Unspecified fall, initial encounter: Secondary | ICD-10-CM

## 2023-10-29 DIAGNOSIS — M1711 Unilateral primary osteoarthritis, right knee: Secondary | ICD-10-CM | POA: Diagnosis not present

## 2023-10-29 DIAGNOSIS — M25562 Pain in left knee: Secondary | ICD-10-CM | POA: Diagnosis not present

## 2023-10-29 DIAGNOSIS — M4802 Spinal stenosis, cervical region: Secondary | ICD-10-CM | POA: Diagnosis not present

## 2023-10-29 DIAGNOSIS — M25551 Pain in right hip: Secondary | ICD-10-CM | POA: Diagnosis not present

## 2023-10-29 DIAGNOSIS — S8001XA Contusion of right knee, initial encounter: Secondary | ICD-10-CM | POA: Diagnosis not present

## 2023-10-29 DIAGNOSIS — S0003XA Contusion of scalp, initial encounter: Secondary | ICD-10-CM | POA: Diagnosis not present

## 2023-10-29 DIAGNOSIS — I700 Atherosclerosis of aorta: Secondary | ICD-10-CM | POA: Diagnosis not present

## 2023-10-29 DIAGNOSIS — M25559 Pain in unspecified hip: Secondary | ICD-10-CM | POA: Diagnosis not present

## 2023-10-29 DIAGNOSIS — R609 Edema, unspecified: Secondary | ICD-10-CM | POA: Diagnosis not present

## 2023-10-29 DIAGNOSIS — M5030 Other cervical disc degeneration, unspecified cervical region: Secondary | ICD-10-CM | POA: Diagnosis not present

## 2023-10-29 DIAGNOSIS — R0989 Other specified symptoms and signs involving the circulatory and respiratory systems: Secondary | ICD-10-CM | POA: Diagnosis not present

## 2023-10-29 DIAGNOSIS — S0990XA Unspecified injury of head, initial encounter: Secondary | ICD-10-CM | POA: Diagnosis not present

## 2023-10-29 DIAGNOSIS — I10 Essential (primary) hypertension: Secondary | ICD-10-CM | POA: Diagnosis not present

## 2023-10-29 DIAGNOSIS — S199XXA Unspecified injury of neck, initial encounter: Secondary | ICD-10-CM | POA: Diagnosis not present

## 2023-10-29 MED ORDER — LIDOCAINE-EPINEPHRINE (PF) 2 %-1:200000 IJ SOLN
20.0000 mL | Freq: Once | INTRAMUSCULAR | Status: AC
  Administered 2023-10-29: 20 mL
  Filled 2023-10-29: qty 20

## 2023-10-29 MED ORDER — TETANUS-DIPHTH-ACELL PERTUSSIS 5-2.5-18.5 LF-MCG/0.5 IM SUSY
0.5000 mL | PREFILLED_SYRINGE | Freq: Once | INTRAMUSCULAR | Status: AC
  Administered 2023-10-29: 0.5 mL via INTRAMUSCULAR
  Filled 2023-10-29: qty 0.5

## 2023-10-29 NOTE — ED Notes (Signed)
 Gordy from [place] was given report

## 2023-10-29 NOTE — ED Notes (Signed)
 Attempted to call [facility] but no answer.

## 2023-10-29 NOTE — Discharge Instructions (Signed)
 You are seen in the emergency department for evaluation of injuries from a fall.  You had a CAT scan of your head and neck along with x-rays of your chest pelvis and both knees.  There were no acute traumatic findings.  You had sutures placed in your face lacerations and these will need to be removed in 5 to 7 days.  Gentle soap and water and topical antibiotic.  Return if any worsening or concerning symptoms.

## 2023-10-29 NOTE — ED Provider Notes (Signed)
 [facility] EMERGENCY DEPARTMENT AT [facility] Provider Note   CSN: [id number] Arrival date & time: 10/29/23  1437     Patient presents with: Fall (Laceration to the forehead)   KIT Catherine West is a 89 y.o. female.  She is brought in by ambulance after a fall at her facility.  Reportedly fell out of her wheelchair, was wearing glasses.  Has lacerations about her face and some abrasions on both of her knees.  Denies initial complaints but does have facial tenderness and right knee tenderness.  No shortening or rotation.  No chest or abdominal pain.  No neck or back pain.  Tetanus 2019  {Add pertinent medical, surgical, social history, OB history to YEP:67052} The history is provided by the patient.  Fall This is a new problem. The current episode started 1 to 2 hours ago. The problem has not changed since onset.Pertinent negatives include no chest pain, no abdominal pain, no headaches and no shortness of breath. Nothing aggravates the symptoms. Nothing relieves the symptoms. She has tried nothing for the symptoms. The treatment provided no relief.       Prior to Admission medications   Medication Sig Start Date End Date Taking? Authorizing Provider  acetaminophen  (TYLENOL ) 650 MG CR tablet Take 650 mg by mouth every 8 (eight) hours as needed for pain.    [provider]  albuterol  (PROVENTIL ) (2.5 MG/3ML) 0.083% nebulizer solution Take 3 mLs (2.5 mg total) by nebulization every 4 (four) hours as needed for wheezing or shortness of breath. 01/22/23 01/22/24  Pearlean Manus, MD  albuterol  (VENTOLIN  HFA) 108 (90 Base) MCG/ACT inhaler Inhale 2 puffs into the lungs every 4 (four) hours as needed for wheezing or shortness of breath. 01/22/23   Emokpae, Courage, MD  amLODipine  (NORVASC ) 10 MG tablet Take 0.5 tablets (5 mg total) by mouth daily. 01/22/23   Pearlean Manus, MD  Cholecalciferol (VITAMIN D) 50 MCG (2000 UT) CAPS Take 2,000 Units by mouth daily.    [provider]  cyanocobalamin  (VITAMIN B12) 1000 MCG tablet Take 1 tablet (1,000 mcg total) by mouth daily. 01/22/23   Pearlean Manus, MD  famotidine  (PEPCID ) 20 MG tablet Take 1 tablet (20 mg total) by mouth daily. 12/06/22   Krishnan, Gokul, MD  ferrous sulfate  325 (65 FE) MG EC tablet Take 1 tablet (325 mg total) by mouth daily with breakfast. 01/22/23   Pearlean Manus, MD  folic acid  (FOLVITE ) 1 MG tablet Take 1 tablet (1 mg total) by mouth daily. 12/06/22   Krishnan, Gokul, MD  furosemide  (LASIX ) 20 MG tablet Take 1 tablet (20 mg total) by mouth daily. 01/22/23 01/22/24  Pearlean Manus, MD  guaiFENesin  (MUCINEX ) 600 MG 12 hr tablet Take 1 tablet (600 mg total) by mouth 2 (two) times daily. 01/22/23 01/22/24  Pearlean Manus, MD  loperamide (IMODIUM A-D) 2 MG tablet Take 2 mg by mouth every 6 (six) hours as needed for diarrhea or loose stools.    [provider]  losartan  (COZAAR ) 50 MG tablet Take 50 mg by mouth daily. 01/18/23   [provider]  polyethylene glycol (MIRALAX  / GLYCOLAX ) 17 g packet Take 17 g by mouth daily. 12/06/22   Krishnan, Gokul, MD  senna-docusate (SENOKOT-S) 8.6-50 MG tablet Take 2 tablets by mouth 2 (two) times daily. Patient taking differently: Take 1 tablet by mouth 2 (two) times daily. 12/06/22   Krishnan, Gokul, MD  sertraline  (ZOLOFT ) 100 MG tablet Take 100 mg by mouth daily.  [provider]    Allergies: Patient has no known allergies.    Review of Systems  Respiratory:  Negative for shortness of breath.   Cardiovascular:  Negative for chest pain.  Gastrointestinal:  Negative for abdominal pain.  Neurological:  Negative for headaches.    Updated Vital Signs BP (!) 152/69 (BP Location: Left Arm)   Pulse 73   Temp 97.9 F (36.6 C) (Axillary)   Resp (!) 21   Ht 5' 2 (1.575 m)   Wt 58.7 kg   SpO2 92%   BMI 23.67 kg/m   Physical Exam Vitals and nursing note reviewed.  Constitutional:      General: She is not in acute  distress.    Appearance: Normal appearance. She is well-developed.  HENT:     Head: Normocephalic.     Comments: Patient has multiple lacerations on her forehead and at her nasal bridge  Eyes:     Conjunctiva/sclera: Conjunctivae normal.    Cardiovascular:     Rate and Rhythm: Normal rate and regular rhythm.     Heart sounds: No murmur heard. Pulmonary:     Effort: Pulmonary effort is normal. No respiratory distress.     Breath sounds: Normal breath sounds. No stridor. No wheezing.  Abdominal:     Palpations: Abdomen is soft.     Tenderness: There is no abdominal tenderness. There is no guarding or rebound.   Musculoskeletal:        General: Tenderness present. No deformity. Normal range of motion.     Cervical back: Neck supple.     Comments: Patient has abrasions and bruising on both of her anterior knees.  Full range of motion.  Hips and ankles nontender.  Distal pulses motor and sensation intact   Skin:    General: Skin is warm and dry.   Neurological:     General: No focal deficit present.     Mental Status: She is alert.     GCS: GCS eye subscore is 4. GCS verbal subscore is 5. GCS motor subscore is 6.     Sensory: No sensory deficit.     Motor: No weakness.        (all labs ordered are listed, but only abnormal results are displayed) Labs Reviewed - No data to display  EKG: EKG Interpretation Date/Time:  Saturday October 29 2023 14:45:17 EDT Ventricular Rate:  76 PR Interval:  198 QRS Duration:  158 QT Interval:  462 QTC Calculation: 520 R Axis:   -55  Text Interpretation: Sinus rhythm Left bundle branch block No significant change since prior 9/24 Confirmed by Towana Sharper [phone]) on 10/29/2023 2:56:07 PM  Radiology: No results found.  {Document cardiac monitor, telemetry assessment procedure when appropriate:32947} .Laceration Repair  Date/Time: 10/29/2023 4:09 PM  Performed by: Towana Sharper BROCKS, MD Authorized by: Towana Sharper BROCKS, MD    Consent:    Consent obtained:  Verbal   Consent given by:  Patient   Risks, benefits, and alternatives were discussed: yes     Risks discussed:  Infection, nerve damage, poor wound healing, pain, retained foreign body, tendon damage and vascular damage   Alternatives discussed:  No treatment, delayed treatment and referral Universal protocol:    Procedure explained and questions answered to patient or proxy's satisfaction: yes     Patient identity confirmed:  Verbally with patient Anesthesia:    Anesthesia method:  Local infiltration   Local anesthetic:  Lidocaine  2% WITH epi Laceration details:    Location:  Face   Face location:  Forehead   Length (cm):  4 Pre-procedure details:    Preparation:  Patient was prepped and draped in usual sterile fashion Treatment:    Area cleansed with:  Saline   Amount of cleaning:  Standard   Irrigation solution:  Sterile saline Skin repair:    Repair method:  Sutures   Suture size:  5-0   Suture material:  Nylon   Suture technique:  Simple interrupted   Number of sutures:  4 Approximation:    Approximation:  Close Repair type:    Repair type:  Simple Post-procedure details:    Dressing:  Open (no dressing)   Procedure completion:  Tolerated well, no immediate complications .Laceration Repair  Date/Time: 10/29/2023 4:10 PM  Performed by: Towana Ozell BROCKS, MD Authorized by: Towana Ozell BROCKS, MD   Consent:    Consent obtained:  Verbal   Consent given by:  Patient   Risks, benefits, and alternatives were discussed: yes     Risks discussed:  Infection, nerve damage, poor wound healing, pain, retained foreign body, tendon damage and vascular damage   Alternatives discussed:  No treatment, delayed treatment and referral Universal protocol:    Procedure explained and questions answered to patient or proxy's satisfaction: yes     Patient identity confirmed:  Verbally with patient Anesthesia:    Anesthesia method:  Local infiltration    Local anesthetic:  Lidocaine  2% WITH epi Laceration details:    Location:  Face   Face location:  Forehead   Length (cm):  2 Pre-procedure details:    Preparation:  Patient was prepped and draped in usual sterile fashion Treatment:    Area cleansed with:  Saline   Amount of cleaning:  Standard   Irrigation solution:  Sterile saline Skin repair:    Repair method:  Sutures   Suture size:  5-0   Suture material:  Nylon   Suture technique:  Simple interrupted   Number of sutures:  2 Approximation:    Approximation:  Close Repair type:    Repair type:  Simple Post-procedure details:    Dressing:  Open (no dressing)   Procedure completion:  Tolerated well, no immediate complications .Laceration Repair  Date/Time: 10/29/2023 4:10 PM  Performed by: Towana Ozell BROCKS, MD Authorized by: Towana Ozell BROCKS, MD   Consent:    Consent obtained:  Verbal   Consent given by:  Patient   Risks, benefits, and alternatives were discussed: yes     Risks discussed:  Infection, nerve damage, poor wound healing, pain, retained foreign body, tendon damage and vascular damage   Alternatives discussed:  No treatment, delayed treatment and referral Universal protocol:    Procedure explained and questions answered to patient or proxy's satisfaction: yes     Patient identity confirmed:  Verbally with patient Anesthesia:    Anesthesia method:  Local infiltration   Local anesthetic:  Lidocaine  2% WITH epi Laceration details:    Location:  Face   Face location:  Nose   Length (cm):  3 Pre-procedure details:    Preparation:  Patient was prepped and draped in usual sterile fashion Treatment:    Area cleansed with:  Saline   Amount of cleaning:  Standard   Irrigation solution:  Sterile saline Skin repair:    Repair method:  Sutures   Suture size:  5-0   Suture material:  Nylon   Suture technique:  Simple interrupted   Number of sutures:  2 Approximation:  Approximation:  Loose Repair type:     Repair type:  Simple Post-procedure details:    Procedure completion:  Tolerated well, no immediate complications    Medications Ordered in the ED  lidocaine -EPINEPHrine (XYLOCAINE  W/EPI) 2 %-1:200000 (PF) injection 20 mL (has no administration in time range)  Tdap (BOOSTRIX ) injection 0.5 mL (has no administration in time range)      {Click here for ABCD2, HEART and other calculators REFRESH Note before signing:1}                              Medical Decision Making Amount and/or Complexity of Data Reviewed Radiology: ordered.  Risk Prescription drug management.   This patient complains of ***; this involves an extensive number of treatment Options and is a complaint that carries with it a high risk of complications and morbidity. The differential includes ***  I ordered, reviewed and interpreted labs, which included *** I ordered medication *** and reviewed PMP when indicated. I ordered imaging studies which included *** and I independently    visualized and interpreted imaging which showed *** Additional history obtained from *** Previous records obtained and reviewed *** I consulted *** and discussed lab and imaging findings and discussed disposition.  Cardiac monitoring reviewed, *** Social determinants considered, *** Critical Interventions: ***  After the interventions stated above, I reevaluated the patient and found *** Admission and further testing considered, ***   {Document critical care time when appropriate  Document review of labs and clinical decision tools ie CHADS2VASC2, etc  Document your independent review of radiology images and any outside records  Document your discussion with family members, caretakers and with consultants  Document social determinants of health affecting pt's care  Document your decision making why or why not admission, treatments were needed:32947:::1}   Final diagnoses:  None    ED Discharge Orders     None

## 2023-10-29 NOTE — ED Notes (Signed)
 Pt has been added to the convo list to go back to [facility] in [place]

## 2023-10-29 NOTE — ED Triage Notes (Signed)
 Pt is  from [facility] for a fall. Pt fell from wheelchair and landed face and broke glasses. skin tears of knees and right side hip pain 20g L AC        Pt has an DNR with no paperwork  EMS VS: [facility] 157 157/60 94% 69

## 2023-10-29 NOTE — ED Notes (Signed)
 Pt MAR say [facility] [place].

## 2023-10-29 NOTE — ED Notes (Signed)
 Delay in d/c pt due to transportation back to [place].

## 2023-10-30 DIAGNOSIS — I959 Hypotension, unspecified: Secondary | ICD-10-CM | POA: Diagnosis not present

## 2023-10-30 DIAGNOSIS — Z743 Need for continuous supervision: Secondary | ICD-10-CM | POA: Diagnosis not present

## 2023-10-30 DIAGNOSIS — I5042 Chronic combined systolic (congestive) and diastolic (congestive) heart failure: Secondary | ICD-10-CM | POA: Diagnosis not present

## 2023-10-30 DIAGNOSIS — R404 Transient alteration of awareness: Secondary | ICD-10-CM | POA: Diagnosis not present

## 2023-10-30 DIAGNOSIS — D649 Anemia, unspecified: Secondary | ICD-10-CM | POA: Diagnosis not present

## 2023-10-30 DIAGNOSIS — I10 Essential (primary) hypertension: Secondary | ICD-10-CM | POA: Diagnosis not present

## 2023-10-31 DIAGNOSIS — Z9981 Dependence on supplemental oxygen: Secondary | ICD-10-CM | POA: Diagnosis not present

## 2023-10-31 DIAGNOSIS — F329 Major depressive disorder, single episode, unspecified: Secondary | ICD-10-CM | POA: Diagnosis not present

## 2023-10-31 DIAGNOSIS — Z556 Problems related to health literacy: Secondary | ICD-10-CM | POA: Diagnosis not present

## 2023-10-31 DIAGNOSIS — M810 Age-related osteoporosis without current pathological fracture: Secondary | ICD-10-CM | POA: Diagnosis not present

## 2023-10-31 DIAGNOSIS — I130 Hypertensive heart and chronic kidney disease with heart failure and stage 1 through stage 4 chronic kidney disease, or unspecified chronic kidney disease: Secondary | ICD-10-CM | POA: Diagnosis not present

## 2023-10-31 DIAGNOSIS — E039 Hypothyroidism, unspecified: Secondary | ICD-10-CM | POA: Diagnosis not present

## 2023-10-31 DIAGNOSIS — N1832 Chronic kidney disease, stage 3b: Secondary | ICD-10-CM | POA: Diagnosis not present

## 2023-10-31 DIAGNOSIS — F419 Anxiety disorder, unspecified: Secondary | ICD-10-CM | POA: Diagnosis not present

## 2023-10-31 DIAGNOSIS — D510 Vitamin B12 deficiency anemia due to intrinsic factor deficiency: Secondary | ICD-10-CM | POA: Diagnosis not present

## 2023-10-31 DIAGNOSIS — I5042 Chronic combined systolic (congestive) and diastolic (congestive) heart failure: Secondary | ICD-10-CM | POA: Diagnosis not present

## 2023-11-07 DIAGNOSIS — W010XXA Fall on same level from slipping, tripping and stumbling without subsequent striking against object, initial encounter: Secondary | ICD-10-CM | POA: Diagnosis not present

## 2023-11-07 DIAGNOSIS — N1832 Chronic kidney disease, stage 3b: Secondary | ICD-10-CM | POA: Diagnosis not present

## 2023-11-07 DIAGNOSIS — I5042 Chronic combined systolic (congestive) and diastolic (congestive) heart failure: Secondary | ICD-10-CM | POA: Diagnosis not present

## 2023-11-07 DIAGNOSIS — E039 Hypothyroidism, unspecified: Secondary | ICD-10-CM | POA: Diagnosis not present

## 2023-11-07 DIAGNOSIS — Z556 Problems related to health literacy: Secondary | ICD-10-CM | POA: Diagnosis not present

## 2023-11-07 DIAGNOSIS — S060X0A Concussion without loss of consciousness, initial encounter: Secondary | ICD-10-CM | POA: Diagnosis not present

## 2023-11-07 DIAGNOSIS — Z9981 Dependence on supplemental oxygen: Secondary | ICD-10-CM | POA: Diagnosis not present

## 2023-11-07 DIAGNOSIS — F329 Major depressive disorder, single episode, unspecified: Secondary | ICD-10-CM | POA: Diagnosis not present

## 2023-11-07 DIAGNOSIS — F419 Anxiety disorder, unspecified: Secondary | ICD-10-CM | POA: Diagnosis not present

## 2023-11-07 DIAGNOSIS — M6281 Muscle weakness (generalized): Secondary | ICD-10-CM | POA: Diagnosis not present

## 2023-11-07 DIAGNOSIS — D510 Vitamin B12 deficiency anemia due to intrinsic factor deficiency: Secondary | ICD-10-CM | POA: Diagnosis not present

## 2023-11-07 DIAGNOSIS — I130 Hypertensive heart and chronic kidney disease with heart failure and stage 1 through stage 4 chronic kidney disease, or unspecified chronic kidney disease: Secondary | ICD-10-CM | POA: Diagnosis not present

## 2023-11-07 DIAGNOSIS — M810 Age-related osteoporosis without current pathological fracture: Secondary | ICD-10-CM | POA: Diagnosis not present

## 2023-11-08 DIAGNOSIS — M79675 Pain in left toe(s): Secondary | ICD-10-CM | POA: Diagnosis not present

## 2023-11-08 DIAGNOSIS — B351 Tinea unguium: Secondary | ICD-10-CM | POA: Diagnosis not present

## 2023-11-08 DIAGNOSIS — F329 Major depressive disorder, single episode, unspecified: Secondary | ICD-10-CM | POA: Diagnosis not present

## 2023-11-08 DIAGNOSIS — M79674 Pain in right toe(s): Secondary | ICD-10-CM | POA: Diagnosis not present

## 2023-11-08 DIAGNOSIS — F419 Anxiety disorder, unspecified: Secondary | ICD-10-CM | POA: Diagnosis not present

## 2023-11-09 DIAGNOSIS — M170 Bilateral primary osteoarthritis of knee: Secondary | ICD-10-CM | POA: Diagnosis not present

## 2023-11-09 DIAGNOSIS — I700 Atherosclerosis of aorta: Secondary | ICD-10-CM | POA: Diagnosis not present

## 2023-11-09 DIAGNOSIS — Z556 Problems related to health literacy: Secondary | ICD-10-CM | POA: Diagnosis not present

## 2023-11-09 DIAGNOSIS — I5042 Chronic combined systolic (congestive) and diastolic (congestive) heart failure: Secondary | ICD-10-CM | POA: Diagnosis not present

## 2023-11-09 DIAGNOSIS — F329 Major depressive disorder, single episode, unspecified: Secondary | ICD-10-CM | POA: Diagnosis not present

## 2023-11-09 DIAGNOSIS — M5030 Other cervical disc degeneration, unspecified cervical region: Secondary | ICD-10-CM | POA: Diagnosis not present

## 2023-11-09 DIAGNOSIS — Z9181 History of falling: Secondary | ICD-10-CM | POA: Diagnosis not present

## 2023-11-09 DIAGNOSIS — E039 Hypothyroidism, unspecified: Secondary | ICD-10-CM | POA: Diagnosis not present

## 2023-11-09 DIAGNOSIS — S8002XD Contusion of left knee, subsequent encounter: Secondary | ICD-10-CM | POA: Diagnosis not present

## 2023-11-09 DIAGNOSIS — M810 Age-related osteoporosis without current pathological fracture: Secondary | ICD-10-CM | POA: Diagnosis not present

## 2023-11-09 DIAGNOSIS — S0121XD Laceration without foreign body of nose, subsequent encounter: Secondary | ICD-10-CM | POA: Diagnosis not present

## 2023-11-09 DIAGNOSIS — D510 Vitamin B12 deficiency anemia due to intrinsic factor deficiency: Secondary | ICD-10-CM | POA: Diagnosis not present

## 2023-11-09 DIAGNOSIS — S0181XD Laceration without foreign body of other part of head, subsequent encounter: Secondary | ICD-10-CM | POA: Diagnosis not present

## 2023-11-09 DIAGNOSIS — Z9981 Dependence on supplemental oxygen: Secondary | ICD-10-CM | POA: Diagnosis not present

## 2023-11-09 DIAGNOSIS — N1832 Chronic kidney disease, stage 3b: Secondary | ICD-10-CM | POA: Diagnosis not present

## 2023-11-09 DIAGNOSIS — F419 Anxiety disorder, unspecified: Secondary | ICD-10-CM | POA: Diagnosis not present

## 2023-11-09 DIAGNOSIS — S8001XD Contusion of right knee, subsequent encounter: Secondary | ICD-10-CM | POA: Diagnosis not present

## 2023-11-10 DIAGNOSIS — J449 Chronic obstructive pulmonary disease, unspecified: Secondary | ICD-10-CM | POA: Diagnosis not present

## 2023-11-10 DIAGNOSIS — R0602 Shortness of breath: Secondary | ICD-10-CM | POA: Diagnosis not present

## 2023-11-11 DIAGNOSIS — S8002XD Contusion of left knee, subsequent encounter: Secondary | ICD-10-CM | POA: Diagnosis not present

## 2023-11-11 DIAGNOSIS — I5042 Chronic combined systolic (congestive) and diastolic (congestive) heart failure: Secondary | ICD-10-CM | POA: Diagnosis not present

## 2023-11-11 DIAGNOSIS — Z556 Problems related to health literacy: Secondary | ICD-10-CM | POA: Diagnosis not present

## 2023-11-11 DIAGNOSIS — E559 Vitamin D deficiency, unspecified: Secondary | ICD-10-CM | POA: Diagnosis not present

## 2023-11-11 DIAGNOSIS — Z9181 History of falling: Secondary | ICD-10-CM | POA: Diagnosis not present

## 2023-11-11 DIAGNOSIS — I700 Atherosclerosis of aorta: Secondary | ICD-10-CM | POA: Diagnosis not present

## 2023-11-11 DIAGNOSIS — M810 Age-related osteoporosis without current pathological fracture: Secondary | ICD-10-CM | POA: Diagnosis not present

## 2023-11-11 DIAGNOSIS — Z9981 Dependence on supplemental oxygen: Secondary | ICD-10-CM | POA: Diagnosis not present

## 2023-11-11 DIAGNOSIS — M170 Bilateral primary osteoarthritis of knee: Secondary | ICD-10-CM | POA: Diagnosis not present

## 2023-11-11 DIAGNOSIS — D649 Anemia, unspecified: Secondary | ICD-10-CM | POA: Diagnosis not present

## 2023-11-11 DIAGNOSIS — F419 Anxiety disorder, unspecified: Secondary | ICD-10-CM | POA: Diagnosis not present

## 2023-11-11 DIAGNOSIS — E039 Hypothyroidism, unspecified: Secondary | ICD-10-CM | POA: Diagnosis not present

## 2023-11-11 DIAGNOSIS — N1832 Chronic kidney disease, stage 3b: Secondary | ICD-10-CM | POA: Diagnosis not present

## 2023-11-11 DIAGNOSIS — S0121XD Laceration without foreign body of nose, subsequent encounter: Secondary | ICD-10-CM | POA: Diagnosis not present

## 2023-11-11 DIAGNOSIS — S0181XD Laceration without foreign body of other part of head, subsequent encounter: Secondary | ICD-10-CM | POA: Diagnosis not present

## 2023-11-11 DIAGNOSIS — F329 Major depressive disorder, single episode, unspecified: Secondary | ICD-10-CM | POA: Diagnosis not present

## 2023-11-11 DIAGNOSIS — I130 Hypertensive heart and chronic kidney disease with heart failure and stage 1 through stage 4 chronic kidney disease, or unspecified chronic kidney disease: Secondary | ICD-10-CM | POA: Diagnosis not present

## 2023-11-11 DIAGNOSIS — K219 Gastro-esophageal reflux disease without esophagitis: Secondary | ICD-10-CM | POA: Diagnosis not present

## 2023-11-11 DIAGNOSIS — M5030 Other cervical disc degeneration, unspecified cervical region: Secondary | ICD-10-CM | POA: Diagnosis not present

## 2023-11-11 DIAGNOSIS — D510 Vitamin B12 deficiency anemia due to intrinsic factor deficiency: Secondary | ICD-10-CM | POA: Diagnosis not present

## 2023-11-11 DIAGNOSIS — S8001XD Contusion of right knee, subsequent encounter: Secondary | ICD-10-CM | POA: Diagnosis not present

## 2023-11-12 DIAGNOSIS — I130 Hypertensive heart and chronic kidney disease with heart failure and stage 1 through stage 4 chronic kidney disease, or unspecified chronic kidney disease: Secondary | ICD-10-CM | POA: Diagnosis not present

## 2023-11-12 DIAGNOSIS — N1832 Chronic kidney disease, stage 3b: Secondary | ICD-10-CM | POA: Diagnosis not present

## 2023-11-12 DIAGNOSIS — Z79899 Other long term (current) drug therapy: Secondary | ICD-10-CM | POA: Diagnosis not present

## 2023-11-14 DIAGNOSIS — R4182 Altered mental status, unspecified: Secondary | ICD-10-CM | POA: Diagnosis not present

## 2023-11-14 DIAGNOSIS — S0181XD Laceration without foreign body of other part of head, subsequent encounter: Secondary | ICD-10-CM | POA: Diagnosis not present

## 2023-11-14 DIAGNOSIS — F419 Anxiety disorder, unspecified: Secondary | ICD-10-CM | POA: Diagnosis not present

## 2023-11-14 DIAGNOSIS — N390 Urinary tract infection, site not specified: Secondary | ICD-10-CM | POA: Diagnosis not present

## 2023-11-14 DIAGNOSIS — S8002XD Contusion of left knee, subsequent encounter: Secondary | ICD-10-CM | POA: Diagnosis not present

## 2023-11-14 DIAGNOSIS — M5030 Other cervical disc degeneration, unspecified cervical region: Secondary | ICD-10-CM | POA: Diagnosis not present

## 2023-11-14 DIAGNOSIS — Z556 Problems related to health literacy: Secondary | ICD-10-CM | POA: Diagnosis not present

## 2023-11-14 DIAGNOSIS — M810 Age-related osteoporosis without current pathological fracture: Secondary | ICD-10-CM | POA: Diagnosis not present

## 2023-11-14 DIAGNOSIS — Z9181 History of falling: Secondary | ICD-10-CM | POA: Diagnosis not present

## 2023-11-14 DIAGNOSIS — N1832 Chronic kidney disease, stage 3b: Secondary | ICD-10-CM | POA: Diagnosis not present

## 2023-11-14 DIAGNOSIS — E039 Hypothyroidism, unspecified: Secondary | ICD-10-CM | POA: Diagnosis not present

## 2023-11-14 DIAGNOSIS — I5042 Chronic combined systolic (congestive) and diastolic (congestive) heart failure: Secondary | ICD-10-CM | POA: Diagnosis not present

## 2023-11-14 DIAGNOSIS — M170 Bilateral primary osteoarthritis of knee: Secondary | ICD-10-CM | POA: Diagnosis not present

## 2023-11-14 DIAGNOSIS — D510 Vitamin B12 deficiency anemia due to intrinsic factor deficiency: Secondary | ICD-10-CM | POA: Diagnosis not present

## 2023-11-14 DIAGNOSIS — Z9981 Dependence on supplemental oxygen: Secondary | ICD-10-CM | POA: Diagnosis not present

## 2023-11-14 DIAGNOSIS — I700 Atherosclerosis of aorta: Secondary | ICD-10-CM | POA: Diagnosis not present

## 2023-11-14 DIAGNOSIS — I130 Hypertensive heart and chronic kidney disease with heart failure and stage 1 through stage 4 chronic kidney disease, or unspecified chronic kidney disease: Secondary | ICD-10-CM | POA: Diagnosis not present

## 2023-11-14 DIAGNOSIS — F329 Major depressive disorder, single episode, unspecified: Secondary | ICD-10-CM | POA: Diagnosis not present

## 2023-11-14 DIAGNOSIS — S8001XD Contusion of right knee, subsequent encounter: Secondary | ICD-10-CM | POA: Diagnosis not present

## 2023-11-14 DIAGNOSIS — S0121XD Laceration without foreign body of nose, subsequent encounter: Secondary | ICD-10-CM | POA: Diagnosis not present

## 2023-11-16 DIAGNOSIS — E039 Hypothyroidism, unspecified: Secondary | ICD-10-CM | POA: Diagnosis not present

## 2023-11-16 DIAGNOSIS — M810 Age-related osteoporosis without current pathological fracture: Secondary | ICD-10-CM | POA: Diagnosis not present

## 2023-11-16 DIAGNOSIS — Z556 Problems related to health literacy: Secondary | ICD-10-CM | POA: Diagnosis not present

## 2023-11-16 DIAGNOSIS — I700 Atherosclerosis of aorta: Secondary | ICD-10-CM | POA: Diagnosis not present

## 2023-11-16 DIAGNOSIS — Z9981 Dependence on supplemental oxygen: Secondary | ICD-10-CM | POA: Diagnosis not present

## 2023-11-16 DIAGNOSIS — D510 Vitamin B12 deficiency anemia due to intrinsic factor deficiency: Secondary | ICD-10-CM | POA: Diagnosis not present

## 2023-11-16 DIAGNOSIS — I5042 Chronic combined systolic (congestive) and diastolic (congestive) heart failure: Secondary | ICD-10-CM | POA: Diagnosis not present

## 2023-11-16 DIAGNOSIS — M170 Bilateral primary osteoarthritis of knee: Secondary | ICD-10-CM | POA: Diagnosis not present

## 2023-11-16 DIAGNOSIS — S8001XD Contusion of right knee, subsequent encounter: Secondary | ICD-10-CM | POA: Diagnosis not present

## 2023-11-16 DIAGNOSIS — N1832 Chronic kidney disease, stage 3b: Secondary | ICD-10-CM | POA: Diagnosis not present

## 2023-11-16 DIAGNOSIS — M5030 Other cervical disc degeneration, unspecified cervical region: Secondary | ICD-10-CM | POA: Diagnosis not present

## 2023-11-16 DIAGNOSIS — Z9181 History of falling: Secondary | ICD-10-CM | POA: Diagnosis not present

## 2023-11-16 DIAGNOSIS — F419 Anxiety disorder, unspecified: Secondary | ICD-10-CM | POA: Diagnosis not present

## 2023-11-16 DIAGNOSIS — F329 Major depressive disorder, single episode, unspecified: Secondary | ICD-10-CM | POA: Diagnosis not present

## 2023-11-16 DIAGNOSIS — I130 Hypertensive heart and chronic kidney disease with heart failure and stage 1 through stage 4 chronic kidney disease, or unspecified chronic kidney disease: Secondary | ICD-10-CM | POA: Diagnosis not present

## 2023-11-16 DIAGNOSIS — S8002XD Contusion of left knee, subsequent encounter: Secondary | ICD-10-CM | POA: Diagnosis not present

## 2023-11-16 DIAGNOSIS — S0181XD Laceration without foreign body of other part of head, subsequent encounter: Secondary | ICD-10-CM | POA: Diagnosis not present

## 2023-11-16 DIAGNOSIS — S0121XD Laceration without foreign body of nose, subsequent encounter: Secondary | ICD-10-CM | POA: Diagnosis not present

## 2023-11-17 DIAGNOSIS — N1832 Chronic kidney disease, stage 3b: Secondary | ICD-10-CM | POA: Diagnosis not present

## 2023-11-17 DIAGNOSIS — I700 Atherosclerosis of aorta: Secondary | ICD-10-CM | POA: Diagnosis not present

## 2023-11-17 DIAGNOSIS — I5042 Chronic combined systolic (congestive) and diastolic (congestive) heart failure: Secondary | ICD-10-CM | POA: Diagnosis not present

## 2023-11-17 DIAGNOSIS — Z9181 History of falling: Secondary | ICD-10-CM | POA: Diagnosis not present

## 2023-11-17 DIAGNOSIS — S0181XD Laceration without foreign body of other part of head, subsequent encounter: Secondary | ICD-10-CM | POA: Diagnosis not present

## 2023-11-17 DIAGNOSIS — S8002XD Contusion of left knee, subsequent encounter: Secondary | ICD-10-CM | POA: Diagnosis not present

## 2023-11-17 DIAGNOSIS — Z9981 Dependence on supplemental oxygen: Secondary | ICD-10-CM | POA: Diagnosis not present

## 2023-11-17 DIAGNOSIS — S0121XD Laceration without foreign body of nose, subsequent encounter: Secondary | ICD-10-CM | POA: Diagnosis not present

## 2023-11-17 DIAGNOSIS — M810 Age-related osteoporosis without current pathological fracture: Secondary | ICD-10-CM | POA: Diagnosis not present

## 2023-11-17 DIAGNOSIS — M5030 Other cervical disc degeneration, unspecified cervical region: Secondary | ICD-10-CM | POA: Diagnosis not present

## 2023-11-17 DIAGNOSIS — E039 Hypothyroidism, unspecified: Secondary | ICD-10-CM | POA: Diagnosis not present

## 2023-11-17 DIAGNOSIS — Z556 Problems related to health literacy: Secondary | ICD-10-CM | POA: Diagnosis not present

## 2023-11-17 DIAGNOSIS — F329 Major depressive disorder, single episode, unspecified: Secondary | ICD-10-CM | POA: Diagnosis not present

## 2023-11-17 DIAGNOSIS — D510 Vitamin B12 deficiency anemia due to intrinsic factor deficiency: Secondary | ICD-10-CM | POA: Diagnosis not present

## 2023-11-17 DIAGNOSIS — I130 Hypertensive heart and chronic kidney disease with heart failure and stage 1 through stage 4 chronic kidney disease, or unspecified chronic kidney disease: Secondary | ICD-10-CM | POA: Diagnosis not present

## 2023-11-17 DIAGNOSIS — S8001XD Contusion of right knee, subsequent encounter: Secondary | ICD-10-CM | POA: Diagnosis not present

## 2023-11-17 DIAGNOSIS — M170 Bilateral primary osteoarthritis of knee: Secondary | ICD-10-CM | POA: Diagnosis not present

## 2023-11-17 DIAGNOSIS — F419 Anxiety disorder, unspecified: Secondary | ICD-10-CM | POA: Diagnosis not present

## 2023-11-18 DIAGNOSIS — S8002XD Contusion of left knee, subsequent encounter: Secondary | ICD-10-CM | POA: Diagnosis not present

## 2023-11-18 DIAGNOSIS — M810 Age-related osteoporosis without current pathological fracture: Secondary | ICD-10-CM | POA: Diagnosis not present

## 2023-11-18 DIAGNOSIS — I130 Hypertensive heart and chronic kidney disease with heart failure and stage 1 through stage 4 chronic kidney disease, or unspecified chronic kidney disease: Secondary | ICD-10-CM | POA: Diagnosis not present

## 2023-11-18 DIAGNOSIS — F419 Anxiety disorder, unspecified: Secondary | ICD-10-CM | POA: Diagnosis not present

## 2023-11-18 DIAGNOSIS — I5042 Chronic combined systolic (congestive) and diastolic (congestive) heart failure: Secondary | ICD-10-CM | POA: Diagnosis not present

## 2023-11-18 DIAGNOSIS — M5030 Other cervical disc degeneration, unspecified cervical region: Secondary | ICD-10-CM | POA: Diagnosis not present

## 2023-11-18 DIAGNOSIS — Z9181 History of falling: Secondary | ICD-10-CM | POA: Diagnosis not present

## 2023-11-18 DIAGNOSIS — I700 Atherosclerosis of aorta: Secondary | ICD-10-CM | POA: Diagnosis not present

## 2023-11-18 DIAGNOSIS — N1832 Chronic kidney disease, stage 3b: Secondary | ICD-10-CM | POA: Diagnosis not present

## 2023-11-18 DIAGNOSIS — Z556 Problems related to health literacy: Secondary | ICD-10-CM | POA: Diagnosis not present

## 2023-11-18 DIAGNOSIS — F329 Major depressive disorder, single episode, unspecified: Secondary | ICD-10-CM | POA: Diagnosis not present

## 2023-11-18 DIAGNOSIS — S8001XD Contusion of right knee, subsequent encounter: Secondary | ICD-10-CM | POA: Diagnosis not present

## 2023-11-18 DIAGNOSIS — D510 Vitamin B12 deficiency anemia due to intrinsic factor deficiency: Secondary | ICD-10-CM | POA: Diagnosis not present

## 2023-11-18 DIAGNOSIS — S0181XD Laceration without foreign body of other part of head, subsequent encounter: Secondary | ICD-10-CM | POA: Diagnosis not present

## 2023-11-18 DIAGNOSIS — M170 Bilateral primary osteoarthritis of knee: Secondary | ICD-10-CM | POA: Diagnosis not present

## 2023-11-18 DIAGNOSIS — S0121XD Laceration without foreign body of nose, subsequent encounter: Secondary | ICD-10-CM | POA: Diagnosis not present

## 2023-11-18 DIAGNOSIS — E039 Hypothyroidism, unspecified: Secondary | ICD-10-CM | POA: Diagnosis not present

## 2023-11-18 DIAGNOSIS — Z9981 Dependence on supplemental oxygen: Secondary | ICD-10-CM | POA: Diagnosis not present

## 2023-11-21 DIAGNOSIS — M810 Age-related osteoporosis without current pathological fracture: Secondary | ICD-10-CM | POA: Diagnosis not present

## 2023-11-21 DIAGNOSIS — Z556 Problems related to health literacy: Secondary | ICD-10-CM | POA: Diagnosis not present

## 2023-11-21 DIAGNOSIS — M170 Bilateral primary osteoarthritis of knee: Secondary | ICD-10-CM | POA: Diagnosis not present

## 2023-11-21 DIAGNOSIS — I5042 Chronic combined systolic (congestive) and diastolic (congestive) heart failure: Secondary | ICD-10-CM | POA: Diagnosis not present

## 2023-11-21 DIAGNOSIS — Z9981 Dependence on supplemental oxygen: Secondary | ICD-10-CM | POA: Diagnosis not present

## 2023-11-21 DIAGNOSIS — I130 Hypertensive heart and chronic kidney disease with heart failure and stage 1 through stage 4 chronic kidney disease, or unspecified chronic kidney disease: Secondary | ICD-10-CM | POA: Diagnosis not present

## 2023-11-21 DIAGNOSIS — N1832 Chronic kidney disease, stage 3b: Secondary | ICD-10-CM | POA: Diagnosis not present

## 2023-11-21 DIAGNOSIS — Z9181 History of falling: Secondary | ICD-10-CM | POA: Diagnosis not present

## 2023-11-21 DIAGNOSIS — E039 Hypothyroidism, unspecified: Secondary | ICD-10-CM | POA: Diagnosis not present

## 2023-11-21 DIAGNOSIS — S8001XD Contusion of right knee, subsequent encounter: Secondary | ICD-10-CM | POA: Diagnosis not present

## 2023-11-21 DIAGNOSIS — F419 Anxiety disorder, unspecified: Secondary | ICD-10-CM | POA: Diagnosis not present

## 2023-11-21 DIAGNOSIS — D510 Vitamin B12 deficiency anemia due to intrinsic factor deficiency: Secondary | ICD-10-CM | POA: Diagnosis not present

## 2023-11-21 DIAGNOSIS — S0121XD Laceration without foreign body of nose, subsequent encounter: Secondary | ICD-10-CM | POA: Diagnosis not present

## 2023-11-21 DIAGNOSIS — S0181XD Laceration without foreign body of other part of head, subsequent encounter: Secondary | ICD-10-CM | POA: Diagnosis not present

## 2023-11-21 DIAGNOSIS — S8002XD Contusion of left knee, subsequent encounter: Secondary | ICD-10-CM | POA: Diagnosis not present

## 2023-11-21 DIAGNOSIS — M5030 Other cervical disc degeneration, unspecified cervical region: Secondary | ICD-10-CM | POA: Diagnosis not present

## 2023-11-21 DIAGNOSIS — I700 Atherosclerosis of aorta: Secondary | ICD-10-CM | POA: Diagnosis not present

## 2023-11-21 DIAGNOSIS — F329 Major depressive disorder, single episode, unspecified: Secondary | ICD-10-CM | POA: Diagnosis not present

## 2023-11-22 DIAGNOSIS — S0121XD Laceration without foreign body of nose, subsequent encounter: Secondary | ICD-10-CM | POA: Diagnosis not present

## 2023-11-22 DIAGNOSIS — E039 Hypothyroidism, unspecified: Secondary | ICD-10-CM | POA: Diagnosis not present

## 2023-11-22 DIAGNOSIS — S8001XD Contusion of right knee, subsequent encounter: Secondary | ICD-10-CM | POA: Diagnosis not present

## 2023-11-22 DIAGNOSIS — F419 Anxiety disorder, unspecified: Secondary | ICD-10-CM | POA: Diagnosis not present

## 2023-11-22 DIAGNOSIS — Z9181 History of falling: Secondary | ICD-10-CM | POA: Diagnosis not present

## 2023-11-22 DIAGNOSIS — D649 Anemia, unspecified: Secondary | ICD-10-CM | POA: Diagnosis not present

## 2023-11-22 DIAGNOSIS — M5030 Other cervical disc degeneration, unspecified cervical region: Secondary | ICD-10-CM | POA: Diagnosis not present

## 2023-11-22 DIAGNOSIS — D510 Vitamin B12 deficiency anemia due to intrinsic factor deficiency: Secondary | ICD-10-CM | POA: Diagnosis not present

## 2023-11-22 DIAGNOSIS — M810 Age-related osteoporosis without current pathological fracture: Secondary | ICD-10-CM | POA: Diagnosis not present

## 2023-11-22 DIAGNOSIS — I130 Hypertensive heart and chronic kidney disease with heart failure and stage 1 through stage 4 chronic kidney disease, or unspecified chronic kidney disease: Secondary | ICD-10-CM | POA: Diagnosis not present

## 2023-11-22 DIAGNOSIS — Z556 Problems related to health literacy: Secondary | ICD-10-CM | POA: Diagnosis not present

## 2023-11-22 DIAGNOSIS — F329 Major depressive disorder, single episode, unspecified: Secondary | ICD-10-CM | POA: Diagnosis not present

## 2023-11-22 DIAGNOSIS — Z9981 Dependence on supplemental oxygen: Secondary | ICD-10-CM | POA: Diagnosis not present

## 2023-11-22 DIAGNOSIS — M170 Bilateral primary osteoarthritis of knee: Secondary | ICD-10-CM | POA: Diagnosis not present

## 2023-11-22 DIAGNOSIS — I700 Atherosclerosis of aorta: Secondary | ICD-10-CM | POA: Diagnosis not present

## 2023-11-22 DIAGNOSIS — S0181XD Laceration without foreign body of other part of head, subsequent encounter: Secondary | ICD-10-CM | POA: Diagnosis not present

## 2023-11-22 DIAGNOSIS — I5042 Chronic combined systolic (congestive) and diastolic (congestive) heart failure: Secondary | ICD-10-CM | POA: Diagnosis not present

## 2023-11-22 DIAGNOSIS — S8002XD Contusion of left knee, subsequent encounter: Secondary | ICD-10-CM | POA: Diagnosis not present

## 2023-11-22 DIAGNOSIS — N1832 Chronic kidney disease, stage 3b: Secondary | ICD-10-CM | POA: Diagnosis not present

## 2023-11-24 DIAGNOSIS — E039 Hypothyroidism, unspecified: Secondary | ICD-10-CM | POA: Diagnosis not present

## 2023-11-24 DIAGNOSIS — S0121XD Laceration without foreign body of nose, subsequent encounter: Secondary | ICD-10-CM | POA: Diagnosis not present

## 2023-11-24 DIAGNOSIS — M5030 Other cervical disc degeneration, unspecified cervical region: Secondary | ICD-10-CM | POA: Diagnosis not present

## 2023-11-24 DIAGNOSIS — D510 Vitamin B12 deficiency anemia due to intrinsic factor deficiency: Secondary | ICD-10-CM | POA: Diagnosis not present

## 2023-11-24 DIAGNOSIS — I5042 Chronic combined systolic (congestive) and diastolic (congestive) heart failure: Secondary | ICD-10-CM | POA: Diagnosis not present

## 2023-11-24 DIAGNOSIS — Z556 Problems related to health literacy: Secondary | ICD-10-CM | POA: Diagnosis not present

## 2023-11-24 DIAGNOSIS — I700 Atherosclerosis of aorta: Secondary | ICD-10-CM | POA: Diagnosis not present

## 2023-11-24 DIAGNOSIS — M810 Age-related osteoporosis without current pathological fracture: Secondary | ICD-10-CM | POA: Diagnosis not present

## 2023-11-24 DIAGNOSIS — S8001XD Contusion of right knee, subsequent encounter: Secondary | ICD-10-CM | POA: Diagnosis not present

## 2023-11-24 DIAGNOSIS — M170 Bilateral primary osteoarthritis of knee: Secondary | ICD-10-CM | POA: Diagnosis not present

## 2023-11-24 DIAGNOSIS — F329 Major depressive disorder, single episode, unspecified: Secondary | ICD-10-CM | POA: Diagnosis not present

## 2023-11-24 DIAGNOSIS — F419 Anxiety disorder, unspecified: Secondary | ICD-10-CM | POA: Diagnosis not present

## 2023-11-24 DIAGNOSIS — N1832 Chronic kidney disease, stage 3b: Secondary | ICD-10-CM | POA: Diagnosis not present

## 2023-11-24 DIAGNOSIS — I130 Hypertensive heart and chronic kidney disease with heart failure and stage 1 through stage 4 chronic kidney disease, or unspecified chronic kidney disease: Secondary | ICD-10-CM | POA: Diagnosis not present

## 2023-11-24 DIAGNOSIS — S8002XD Contusion of left knee, subsequent encounter: Secondary | ICD-10-CM | POA: Diagnosis not present

## 2023-11-24 DIAGNOSIS — Z9981 Dependence on supplemental oxygen: Secondary | ICD-10-CM | POA: Diagnosis not present

## 2023-11-24 DIAGNOSIS — S0181XD Laceration without foreign body of other part of head, subsequent encounter: Secondary | ICD-10-CM | POA: Diagnosis not present

## 2023-11-24 DIAGNOSIS — Z9181 History of falling: Secondary | ICD-10-CM | POA: Diagnosis not present

## 2023-11-29 DIAGNOSIS — S0121XD Laceration without foreign body of nose, subsequent encounter: Secondary | ICD-10-CM | POA: Diagnosis not present

## 2023-11-29 DIAGNOSIS — E039 Hypothyroidism, unspecified: Secondary | ICD-10-CM | POA: Diagnosis not present

## 2023-11-29 DIAGNOSIS — M5030 Other cervical disc degeneration, unspecified cervical region: Secondary | ICD-10-CM | POA: Diagnosis not present

## 2023-11-29 DIAGNOSIS — Z9181 History of falling: Secondary | ICD-10-CM | POA: Diagnosis not present

## 2023-11-29 DIAGNOSIS — D510 Vitamin B12 deficiency anemia due to intrinsic factor deficiency: Secondary | ICD-10-CM | POA: Diagnosis not present

## 2023-11-29 DIAGNOSIS — I130 Hypertensive heart and chronic kidney disease with heart failure and stage 1 through stage 4 chronic kidney disease, or unspecified chronic kidney disease: Secondary | ICD-10-CM | POA: Diagnosis not present

## 2023-11-29 DIAGNOSIS — M810 Age-related osteoporosis without current pathological fracture: Secondary | ICD-10-CM | POA: Diagnosis not present

## 2023-11-29 DIAGNOSIS — I5042 Chronic combined systolic (congestive) and diastolic (congestive) heart failure: Secondary | ICD-10-CM | POA: Diagnosis not present

## 2023-11-29 DIAGNOSIS — S8002XD Contusion of left knee, subsequent encounter: Secondary | ICD-10-CM | POA: Diagnosis not present

## 2023-11-29 DIAGNOSIS — I700 Atherosclerosis of aorta: Secondary | ICD-10-CM | POA: Diagnosis not present

## 2023-11-29 DIAGNOSIS — M170 Bilateral primary osteoarthritis of knee: Secondary | ICD-10-CM | POA: Diagnosis not present

## 2023-11-29 DIAGNOSIS — Z556 Problems related to health literacy: Secondary | ICD-10-CM | POA: Diagnosis not present

## 2023-11-29 DIAGNOSIS — F419 Anxiety disorder, unspecified: Secondary | ICD-10-CM | POA: Diagnosis not present

## 2023-11-29 DIAGNOSIS — S8001XD Contusion of right knee, subsequent encounter: Secondary | ICD-10-CM | POA: Diagnosis not present

## 2023-11-29 DIAGNOSIS — N1832 Chronic kidney disease, stage 3b: Secondary | ICD-10-CM | POA: Diagnosis not present

## 2023-11-29 DIAGNOSIS — Z9981 Dependence on supplemental oxygen: Secondary | ICD-10-CM | POA: Diagnosis not present

## 2023-11-29 DIAGNOSIS — S0181XD Laceration without foreign body of other part of head, subsequent encounter: Secondary | ICD-10-CM | POA: Diagnosis not present

## 2023-11-29 DIAGNOSIS — F329 Major depressive disorder, single episode, unspecified: Secondary | ICD-10-CM | POA: Diagnosis not present

## 2023-11-30 DIAGNOSIS — S8001XD Contusion of right knee, subsequent encounter: Secondary | ICD-10-CM | POA: Diagnosis not present

## 2023-11-30 DIAGNOSIS — Z9181 History of falling: Secondary | ICD-10-CM | POA: Diagnosis not present

## 2023-11-30 DIAGNOSIS — S8002XD Contusion of left knee, subsequent encounter: Secondary | ICD-10-CM | POA: Diagnosis not present

## 2023-11-30 DIAGNOSIS — F419 Anxiety disorder, unspecified: Secondary | ICD-10-CM | POA: Diagnosis not present

## 2023-11-30 DIAGNOSIS — S0121XD Laceration without foreign body of nose, subsequent encounter: Secondary | ICD-10-CM | POA: Diagnosis not present

## 2023-11-30 DIAGNOSIS — I700 Atherosclerosis of aorta: Secondary | ICD-10-CM | POA: Diagnosis not present

## 2023-11-30 DIAGNOSIS — E039 Hypothyroidism, unspecified: Secondary | ICD-10-CM | POA: Diagnosis not present

## 2023-11-30 DIAGNOSIS — M5030 Other cervical disc degeneration, unspecified cervical region: Secondary | ICD-10-CM | POA: Diagnosis not present

## 2023-11-30 DIAGNOSIS — S0181XD Laceration without foreign body of other part of head, subsequent encounter: Secondary | ICD-10-CM | POA: Diagnosis not present

## 2023-11-30 DIAGNOSIS — I130 Hypertensive heart and chronic kidney disease with heart failure and stage 1 through stage 4 chronic kidney disease, or unspecified chronic kidney disease: Secondary | ICD-10-CM | POA: Diagnosis not present

## 2023-11-30 DIAGNOSIS — Z9981 Dependence on supplemental oxygen: Secondary | ICD-10-CM | POA: Diagnosis not present

## 2023-11-30 DIAGNOSIS — F329 Major depressive disorder, single episode, unspecified: Secondary | ICD-10-CM | POA: Diagnosis not present

## 2023-11-30 DIAGNOSIS — M810 Age-related osteoporosis without current pathological fracture: Secondary | ICD-10-CM | POA: Diagnosis not present

## 2023-11-30 DIAGNOSIS — D510 Vitamin B12 deficiency anemia due to intrinsic factor deficiency: Secondary | ICD-10-CM | POA: Diagnosis not present

## 2023-11-30 DIAGNOSIS — N1832 Chronic kidney disease, stage 3b: Secondary | ICD-10-CM | POA: Diagnosis not present

## 2023-11-30 DIAGNOSIS — I5042 Chronic combined systolic (congestive) and diastolic (congestive) heart failure: Secondary | ICD-10-CM | POA: Diagnosis not present

## 2023-11-30 DIAGNOSIS — Z556 Problems related to health literacy: Secondary | ICD-10-CM | POA: Diagnosis not present

## 2023-11-30 DIAGNOSIS — M170 Bilateral primary osteoarthritis of knee: Secondary | ICD-10-CM | POA: Diagnosis not present

## 2023-12-05 DIAGNOSIS — E559 Vitamin D deficiency, unspecified: Secondary | ICD-10-CM | POA: Diagnosis not present

## 2023-12-05 DIAGNOSIS — I10 Essential (primary) hypertension: Secondary | ICD-10-CM | POA: Diagnosis not present

## 2023-12-05 DIAGNOSIS — E782 Mixed hyperlipidemia: Secondary | ICD-10-CM | POA: Diagnosis not present

## 2023-12-06 DIAGNOSIS — S8002XD Contusion of left knee, subsequent encounter: Secondary | ICD-10-CM | POA: Diagnosis not present

## 2023-12-06 DIAGNOSIS — I5042 Chronic combined systolic (congestive) and diastolic (congestive) heart failure: Secondary | ICD-10-CM | POA: Diagnosis not present

## 2023-12-06 DIAGNOSIS — Z9181 History of falling: Secondary | ICD-10-CM | POA: Diagnosis not present

## 2023-12-06 DIAGNOSIS — E039 Hypothyroidism, unspecified: Secondary | ICD-10-CM | POA: Diagnosis not present

## 2023-12-06 DIAGNOSIS — Z556 Problems related to health literacy: Secondary | ICD-10-CM | POA: Diagnosis not present

## 2023-12-06 DIAGNOSIS — D510 Vitamin B12 deficiency anemia due to intrinsic factor deficiency: Secondary | ICD-10-CM | POA: Diagnosis not present

## 2023-12-06 DIAGNOSIS — M810 Age-related osteoporosis without current pathological fracture: Secondary | ICD-10-CM | POA: Diagnosis not present

## 2023-12-06 DIAGNOSIS — N1832 Chronic kidney disease, stage 3b: Secondary | ICD-10-CM | POA: Diagnosis not present

## 2023-12-06 DIAGNOSIS — S0181XD Laceration without foreign body of other part of head, subsequent encounter: Secondary | ICD-10-CM | POA: Diagnosis not present

## 2023-12-06 DIAGNOSIS — I130 Hypertensive heart and chronic kidney disease with heart failure and stage 1 through stage 4 chronic kidney disease, or unspecified chronic kidney disease: Secondary | ICD-10-CM | POA: Diagnosis not present

## 2023-12-06 DIAGNOSIS — S8001XD Contusion of right knee, subsequent encounter: Secondary | ICD-10-CM | POA: Diagnosis not present

## 2023-12-06 DIAGNOSIS — S0121XD Laceration without foreign body of nose, subsequent encounter: Secondary | ICD-10-CM | POA: Diagnosis not present

## 2023-12-06 DIAGNOSIS — I700 Atherosclerosis of aorta: Secondary | ICD-10-CM | POA: Diagnosis not present

## 2023-12-06 DIAGNOSIS — M170 Bilateral primary osteoarthritis of knee: Secondary | ICD-10-CM | POA: Diagnosis not present

## 2023-12-06 DIAGNOSIS — Z9981 Dependence on supplemental oxygen: Secondary | ICD-10-CM | POA: Diagnosis not present

## 2023-12-06 DIAGNOSIS — F329 Major depressive disorder, single episode, unspecified: Secondary | ICD-10-CM | POA: Diagnosis not present

## 2023-12-06 DIAGNOSIS — M5030 Other cervical disc degeneration, unspecified cervical region: Secondary | ICD-10-CM | POA: Diagnosis not present

## 2023-12-06 DIAGNOSIS — F419 Anxiety disorder, unspecified: Secondary | ICD-10-CM | POA: Diagnosis not present

## 2023-12-09 DIAGNOSIS — S0181XD Laceration without foreign body of other part of head, subsequent encounter: Secondary | ICD-10-CM | POA: Diagnosis not present

## 2023-12-09 DIAGNOSIS — M5030 Other cervical disc degeneration, unspecified cervical region: Secondary | ICD-10-CM | POA: Diagnosis not present

## 2023-12-09 DIAGNOSIS — S8001XD Contusion of right knee, subsequent encounter: Secondary | ICD-10-CM | POA: Diagnosis not present

## 2023-12-09 DIAGNOSIS — S0121XD Laceration without foreign body of nose, subsequent encounter: Secondary | ICD-10-CM | POA: Diagnosis not present

## 2023-12-09 DIAGNOSIS — Z9981 Dependence on supplemental oxygen: Secondary | ICD-10-CM | POA: Diagnosis not present

## 2023-12-09 DIAGNOSIS — S8002XD Contusion of left knee, subsequent encounter: Secondary | ICD-10-CM | POA: Diagnosis not present

## 2023-12-09 DIAGNOSIS — M170 Bilateral primary osteoarthritis of knee: Secondary | ICD-10-CM | POA: Diagnosis not present

## 2023-12-09 DIAGNOSIS — D510 Vitamin B12 deficiency anemia due to intrinsic factor deficiency: Secondary | ICD-10-CM | POA: Diagnosis not present

## 2023-12-09 DIAGNOSIS — F329 Major depressive disorder, single episode, unspecified: Secondary | ICD-10-CM | POA: Diagnosis not present

## 2023-12-09 DIAGNOSIS — F419 Anxiety disorder, unspecified: Secondary | ICD-10-CM | POA: Diagnosis not present

## 2023-12-09 DIAGNOSIS — I700 Atherosclerosis of aorta: Secondary | ICD-10-CM | POA: Diagnosis not present

## 2023-12-09 DIAGNOSIS — I130 Hypertensive heart and chronic kidney disease with heart failure and stage 1 through stage 4 chronic kidney disease, or unspecified chronic kidney disease: Secondary | ICD-10-CM | POA: Diagnosis not present

## 2023-12-09 DIAGNOSIS — N1832 Chronic kidney disease, stage 3b: Secondary | ICD-10-CM | POA: Diagnosis not present

## 2023-12-09 DIAGNOSIS — Z9181 History of falling: Secondary | ICD-10-CM | POA: Diagnosis not present

## 2023-12-09 DIAGNOSIS — E039 Hypothyroidism, unspecified: Secondary | ICD-10-CM | POA: Diagnosis not present

## 2023-12-09 DIAGNOSIS — I5042 Chronic combined systolic (congestive) and diastolic (congestive) heart failure: Secondary | ICD-10-CM | POA: Diagnosis not present

## 2023-12-09 DIAGNOSIS — M810 Age-related osteoporosis without current pathological fracture: Secondary | ICD-10-CM | POA: Diagnosis not present

## 2023-12-09 DIAGNOSIS — Z556 Problems related to health literacy: Secondary | ICD-10-CM | POA: Diagnosis not present

## 2023-12-11 DIAGNOSIS — J449 Chronic obstructive pulmonary disease, unspecified: Secondary | ICD-10-CM | POA: Diagnosis not present

## 2023-12-11 DIAGNOSIS — R0602 Shortness of breath: Secondary | ICD-10-CM | POA: Diagnosis not present

## 2023-12-16 DIAGNOSIS — F419 Anxiety disorder, unspecified: Secondary | ICD-10-CM | POA: Diagnosis not present

## 2023-12-16 DIAGNOSIS — M810 Age-related osteoporosis without current pathological fracture: Secondary | ICD-10-CM | POA: Diagnosis not present

## 2023-12-16 DIAGNOSIS — S0121XD Laceration without foreign body of nose, subsequent encounter: Secondary | ICD-10-CM | POA: Diagnosis not present

## 2023-12-16 DIAGNOSIS — S8001XD Contusion of right knee, subsequent encounter: Secondary | ICD-10-CM | POA: Diagnosis not present

## 2023-12-16 DIAGNOSIS — N1832 Chronic kidney disease, stage 3b: Secondary | ICD-10-CM | POA: Diagnosis not present

## 2023-12-16 DIAGNOSIS — E039 Hypothyroidism, unspecified: Secondary | ICD-10-CM | POA: Diagnosis not present

## 2023-12-16 DIAGNOSIS — Z9981 Dependence on supplemental oxygen: Secondary | ICD-10-CM | POA: Diagnosis not present

## 2023-12-16 DIAGNOSIS — M5030 Other cervical disc degeneration, unspecified cervical region: Secondary | ICD-10-CM | POA: Diagnosis not present

## 2023-12-16 DIAGNOSIS — S8002XD Contusion of left knee, subsequent encounter: Secondary | ICD-10-CM | POA: Diagnosis not present

## 2023-12-16 DIAGNOSIS — D510 Vitamin B12 deficiency anemia due to intrinsic factor deficiency: Secondary | ICD-10-CM | POA: Diagnosis not present

## 2023-12-16 DIAGNOSIS — Z9181 History of falling: Secondary | ICD-10-CM | POA: Diagnosis not present

## 2023-12-16 DIAGNOSIS — I130 Hypertensive heart and chronic kidney disease with heart failure and stage 1 through stage 4 chronic kidney disease, or unspecified chronic kidney disease: Secondary | ICD-10-CM | POA: Diagnosis not present

## 2023-12-16 DIAGNOSIS — I700 Atherosclerosis of aorta: Secondary | ICD-10-CM | POA: Diagnosis not present

## 2023-12-16 DIAGNOSIS — Z556 Problems related to health literacy: Secondary | ICD-10-CM | POA: Diagnosis not present

## 2023-12-16 DIAGNOSIS — F329 Major depressive disorder, single episode, unspecified: Secondary | ICD-10-CM | POA: Diagnosis not present

## 2023-12-16 DIAGNOSIS — I5042 Chronic combined systolic (congestive) and diastolic (congestive) heart failure: Secondary | ICD-10-CM | POA: Diagnosis not present

## 2023-12-16 DIAGNOSIS — M170 Bilateral primary osteoarthritis of knee: Secondary | ICD-10-CM | POA: Diagnosis not present

## 2023-12-16 DIAGNOSIS — S0181XD Laceration without foreign body of other part of head, subsequent encounter: Secondary | ICD-10-CM | POA: Diagnosis not present

## 2023-12-20 DIAGNOSIS — N1832 Chronic kidney disease, stage 3b: Secondary | ICD-10-CM | POA: Diagnosis not present

## 2023-12-20 DIAGNOSIS — M810 Age-related osteoporosis without current pathological fracture: Secondary | ICD-10-CM | POA: Diagnosis not present

## 2023-12-20 DIAGNOSIS — S0121XD Laceration without foreign body of nose, subsequent encounter: Secondary | ICD-10-CM | POA: Diagnosis not present

## 2023-12-20 DIAGNOSIS — E039 Hypothyroidism, unspecified: Secondary | ICD-10-CM | POA: Diagnosis not present

## 2023-12-20 DIAGNOSIS — M170 Bilateral primary osteoarthritis of knee: Secondary | ICD-10-CM | POA: Diagnosis not present

## 2023-12-20 DIAGNOSIS — Z9181 History of falling: Secondary | ICD-10-CM | POA: Diagnosis not present

## 2023-12-20 DIAGNOSIS — S8001XD Contusion of right knee, subsequent encounter: Secondary | ICD-10-CM | POA: Diagnosis not present

## 2023-12-20 DIAGNOSIS — S0181XD Laceration without foreign body of other part of head, subsequent encounter: Secondary | ICD-10-CM | POA: Diagnosis not present

## 2023-12-20 DIAGNOSIS — I130 Hypertensive heart and chronic kidney disease with heart failure and stage 1 through stage 4 chronic kidney disease, or unspecified chronic kidney disease: Secondary | ICD-10-CM | POA: Diagnosis not present

## 2023-12-20 DIAGNOSIS — Z556 Problems related to health literacy: Secondary | ICD-10-CM | POA: Diagnosis not present

## 2023-12-20 DIAGNOSIS — D510 Vitamin B12 deficiency anemia due to intrinsic factor deficiency: Secondary | ICD-10-CM | POA: Diagnosis not present

## 2023-12-20 DIAGNOSIS — I5042 Chronic combined systolic (congestive) and diastolic (congestive) heart failure: Secondary | ICD-10-CM | POA: Diagnosis not present

## 2023-12-20 DIAGNOSIS — S8002XD Contusion of left knee, subsequent encounter: Secondary | ICD-10-CM | POA: Diagnosis not present

## 2023-12-20 DIAGNOSIS — F329 Major depressive disorder, single episode, unspecified: Secondary | ICD-10-CM | POA: Diagnosis not present

## 2023-12-20 DIAGNOSIS — I700 Atherosclerosis of aorta: Secondary | ICD-10-CM | POA: Diagnosis not present

## 2023-12-20 DIAGNOSIS — Z9981 Dependence on supplemental oxygen: Secondary | ICD-10-CM | POA: Diagnosis not present

## 2023-12-20 DIAGNOSIS — M5030 Other cervical disc degeneration, unspecified cervical region: Secondary | ICD-10-CM | POA: Diagnosis not present

## 2023-12-20 DIAGNOSIS — F419 Anxiety disorder, unspecified: Secondary | ICD-10-CM | POA: Diagnosis not present

## 2023-12-26 DIAGNOSIS — I5042 Chronic combined systolic (congestive) and diastolic (congestive) heart failure: Secondary | ICD-10-CM | POA: Diagnosis not present

## 2023-12-26 DIAGNOSIS — F331 Major depressive disorder, recurrent, moderate: Secondary | ICD-10-CM | POA: Diagnosis not present

## 2023-12-26 DIAGNOSIS — R4189 Other symptoms and signs involving cognitive functions and awareness: Secondary | ICD-10-CM | POA: Diagnosis not present

## 2023-12-26 DIAGNOSIS — D649 Anemia, unspecified: Secondary | ICD-10-CM | POA: Diagnosis not present

## 2023-12-28 DIAGNOSIS — I5042 Chronic combined systolic (congestive) and diastolic (congestive) heart failure: Secondary | ICD-10-CM | POA: Diagnosis not present

## 2023-12-28 DIAGNOSIS — S0181XD Laceration without foreign body of other part of head, subsequent encounter: Secondary | ICD-10-CM | POA: Diagnosis not present

## 2023-12-28 DIAGNOSIS — S8002XD Contusion of left knee, subsequent encounter: Secondary | ICD-10-CM | POA: Diagnosis not present

## 2023-12-28 DIAGNOSIS — D510 Vitamin B12 deficiency anemia due to intrinsic factor deficiency: Secondary | ICD-10-CM | POA: Diagnosis not present

## 2023-12-28 DIAGNOSIS — Z9981 Dependence on supplemental oxygen: Secondary | ICD-10-CM | POA: Diagnosis not present

## 2023-12-28 DIAGNOSIS — F329 Major depressive disorder, single episode, unspecified: Secondary | ICD-10-CM | POA: Diagnosis not present

## 2023-12-28 DIAGNOSIS — S8001XD Contusion of right knee, subsequent encounter: Secondary | ICD-10-CM | POA: Diagnosis not present

## 2023-12-28 DIAGNOSIS — N1832 Chronic kidney disease, stage 3b: Secondary | ICD-10-CM | POA: Diagnosis not present

## 2023-12-28 DIAGNOSIS — Z556 Problems related to health literacy: Secondary | ICD-10-CM | POA: Diagnosis not present

## 2023-12-28 DIAGNOSIS — M170 Bilateral primary osteoarthritis of knee: Secondary | ICD-10-CM | POA: Diagnosis not present

## 2023-12-28 DIAGNOSIS — S0121XD Laceration without foreign body of nose, subsequent encounter: Secondary | ICD-10-CM | POA: Diagnosis not present

## 2023-12-28 DIAGNOSIS — M810 Age-related osteoporosis without current pathological fracture: Secondary | ICD-10-CM | POA: Diagnosis not present

## 2023-12-28 DIAGNOSIS — I700 Atherosclerosis of aorta: Secondary | ICD-10-CM | POA: Diagnosis not present

## 2023-12-28 DIAGNOSIS — D649 Anemia, unspecified: Secondary | ICD-10-CM | POA: Diagnosis not present

## 2023-12-28 DIAGNOSIS — Z9181 History of falling: Secondary | ICD-10-CM | POA: Diagnosis not present

## 2023-12-28 DIAGNOSIS — E039 Hypothyroidism, unspecified: Secondary | ICD-10-CM | POA: Diagnosis not present

## 2023-12-28 DIAGNOSIS — F419 Anxiety disorder, unspecified: Secondary | ICD-10-CM | POA: Diagnosis not present

## 2023-12-28 DIAGNOSIS — M5030 Other cervical disc degeneration, unspecified cervical region: Secondary | ICD-10-CM | POA: Diagnosis not present

## 2023-12-28 DIAGNOSIS — I130 Hypertensive heart and chronic kidney disease with heart failure and stage 1 through stage 4 chronic kidney disease, or unspecified chronic kidney disease: Secondary | ICD-10-CM | POA: Diagnosis not present

## 2023-12-31 DIAGNOSIS — R059 Cough, unspecified: Secondary | ICD-10-CM | POA: Diagnosis not present

## 2024-01-03 DIAGNOSIS — Z9181 History of falling: Secondary | ICD-10-CM | POA: Diagnosis not present

## 2024-01-03 DIAGNOSIS — M810 Age-related osteoporosis without current pathological fracture: Secondary | ICD-10-CM | POA: Diagnosis not present

## 2024-01-03 DIAGNOSIS — M170 Bilateral primary osteoarthritis of knee: Secondary | ICD-10-CM | POA: Diagnosis not present

## 2024-01-03 DIAGNOSIS — M5030 Other cervical disc degeneration, unspecified cervical region: Secondary | ICD-10-CM | POA: Diagnosis not present

## 2024-01-03 DIAGNOSIS — S8002XD Contusion of left knee, subsequent encounter: Secondary | ICD-10-CM | POA: Diagnosis not present

## 2024-01-03 DIAGNOSIS — S0121XD Laceration without foreign body of nose, subsequent encounter: Secondary | ICD-10-CM | POA: Diagnosis not present

## 2024-01-03 DIAGNOSIS — S0181XD Laceration without foreign body of other part of head, subsequent encounter: Secondary | ICD-10-CM | POA: Diagnosis not present

## 2024-01-03 DIAGNOSIS — E039 Hypothyroidism, unspecified: Secondary | ICD-10-CM | POA: Diagnosis not present

## 2024-01-03 DIAGNOSIS — S8001XD Contusion of right knee, subsequent encounter: Secondary | ICD-10-CM | POA: Diagnosis not present

## 2024-01-03 DIAGNOSIS — N1832 Chronic kidney disease, stage 3b: Secondary | ICD-10-CM | POA: Diagnosis not present

## 2024-01-03 DIAGNOSIS — F419 Anxiety disorder, unspecified: Secondary | ICD-10-CM | POA: Diagnosis not present

## 2024-01-03 DIAGNOSIS — I5042 Chronic combined systolic (congestive) and diastolic (congestive) heart failure: Secondary | ICD-10-CM | POA: Diagnosis not present

## 2024-01-03 DIAGNOSIS — D510 Vitamin B12 deficiency anemia due to intrinsic factor deficiency: Secondary | ICD-10-CM | POA: Diagnosis not present

## 2024-01-03 DIAGNOSIS — Z9981 Dependence on supplemental oxygen: Secondary | ICD-10-CM | POA: Diagnosis not present

## 2024-01-03 DIAGNOSIS — I130 Hypertensive heart and chronic kidney disease with heart failure and stage 1 through stage 4 chronic kidney disease, or unspecified chronic kidney disease: Secondary | ICD-10-CM | POA: Diagnosis not present

## 2024-01-03 DIAGNOSIS — F329 Major depressive disorder, single episode, unspecified: Secondary | ICD-10-CM | POA: Diagnosis not present

## 2024-01-03 DIAGNOSIS — Z556 Problems related to health literacy: Secondary | ICD-10-CM | POA: Diagnosis not present

## 2024-01-03 DIAGNOSIS — I700 Atherosclerosis of aorta: Secondary | ICD-10-CM | POA: Diagnosis not present

## 2024-01-11 DIAGNOSIS — R0602 Shortness of breath: Secondary | ICD-10-CM | POA: Diagnosis not present

## 2024-01-23 DIAGNOSIS — M6281 Muscle weakness (generalized): Secondary | ICD-10-CM | POA: Diagnosis not present

## 2024-01-23 DIAGNOSIS — E782 Mixed hyperlipidemia: Secondary | ICD-10-CM | POA: Diagnosis not present

## 2024-01-23 DIAGNOSIS — I10 Essential (primary) hypertension: Secondary | ICD-10-CM | POA: Diagnosis not present

## 2024-01-27 DIAGNOSIS — R4182 Altered mental status, unspecified: Secondary | ICD-10-CM | POA: Diagnosis not present

## 2024-01-27 DIAGNOSIS — N390 Urinary tract infection, site not specified: Secondary | ICD-10-CM | POA: Diagnosis not present

## 2024-01-30 DIAGNOSIS — N390 Urinary tract infection, site not specified: Secondary | ICD-10-CM | POA: Diagnosis not present

## 2024-01-30 DIAGNOSIS — D649 Anemia, unspecified: Secondary | ICD-10-CM | POA: Diagnosis not present

## 2024-01-30 DIAGNOSIS — R4182 Altered mental status, unspecified: Secondary | ICD-10-CM | POA: Diagnosis not present

## 2024-01-30 DIAGNOSIS — I10 Essential (primary) hypertension: Secondary | ICD-10-CM | POA: Diagnosis not present

## 2024-01-31 DIAGNOSIS — M79674 Pain in right toe(s): Secondary | ICD-10-CM | POA: Diagnosis not present

## 2024-01-31 DIAGNOSIS — M79675 Pain in left toe(s): Secondary | ICD-10-CM | POA: Diagnosis not present

## 2024-01-31 DIAGNOSIS — B351 Tinea unguium: Secondary | ICD-10-CM | POA: Diagnosis not present

## 2024-02-06 DIAGNOSIS — T148XXA Other injury of unspecified body region, initial encounter: Secondary | ICD-10-CM | POA: Diagnosis not present

## 2024-02-06 DIAGNOSIS — N390 Urinary tract infection, site not specified: Secondary | ICD-10-CM | POA: Diagnosis not present

## 2024-02-10 DIAGNOSIS — R0602 Shortness of breath: Secondary | ICD-10-CM | POA: Diagnosis not present

## 2024-02-12 DIAGNOSIS — N390 Urinary tract infection, site not specified: Secondary | ICD-10-CM | POA: Diagnosis not present

## 2024-02-12 DIAGNOSIS — F32A Depression, unspecified: Secondary | ICD-10-CM | POA: Diagnosis not present

## 2024-02-12 DIAGNOSIS — Z9981 Dependence on supplemental oxygen: Secondary | ICD-10-CM | POA: Diagnosis not present

## 2024-02-12 DIAGNOSIS — M8580 Other specified disorders of bone density and structure, unspecified site: Secondary | ICD-10-CM | POA: Diagnosis not present

## 2024-02-12 DIAGNOSIS — D631 Anemia in chronic kidney disease: Secondary | ICD-10-CM | POA: Diagnosis not present

## 2024-02-12 DIAGNOSIS — S81811D Laceration without foreign body, right lower leg, subsequent encounter: Secondary | ICD-10-CM | POA: Diagnosis not present

## 2024-02-12 DIAGNOSIS — E559 Vitamin D deficiency, unspecified: Secondary | ICD-10-CM | POA: Diagnosis not present

## 2024-02-12 DIAGNOSIS — G894 Chronic pain syndrome: Secondary | ICD-10-CM | POA: Diagnosis not present

## 2024-02-12 DIAGNOSIS — I130 Hypertensive heart and chronic kidney disease with heart failure and stage 1 through stage 4 chronic kidney disease, or unspecified chronic kidney disease: Secondary | ICD-10-CM | POA: Diagnosis not present

## 2024-02-12 DIAGNOSIS — Z556 Problems related to health literacy: Secondary | ICD-10-CM | POA: Diagnosis not present

## 2024-02-12 DIAGNOSIS — Z604 Social exclusion and rejection: Secondary | ICD-10-CM | POA: Diagnosis not present

## 2024-02-12 DIAGNOSIS — I509 Heart failure, unspecified: Secondary | ICD-10-CM | POA: Diagnosis not present

## 2024-02-12 DIAGNOSIS — Z9181 History of falling: Secondary | ICD-10-CM | POA: Diagnosis not present

## 2024-02-12 DIAGNOSIS — N189 Chronic kidney disease, unspecified: Secondary | ICD-10-CM | POA: Diagnosis not present

## 2024-02-12 DIAGNOSIS — K219 Gastro-esophageal reflux disease without esophagitis: Secondary | ICD-10-CM | POA: Diagnosis not present

## 2024-02-12 DIAGNOSIS — F419 Anxiety disorder, unspecified: Secondary | ICD-10-CM | POA: Diagnosis not present

## 2024-02-12 DIAGNOSIS — E785 Hyperlipidemia, unspecified: Secondary | ICD-10-CM | POA: Diagnosis not present

## 2024-02-12 DIAGNOSIS — M545 Low back pain, unspecified: Secondary | ICD-10-CM | POA: Diagnosis not present

## 2024-02-13 DIAGNOSIS — L89309 Pressure ulcer of unspecified buttock, unspecified stage: Secondary | ICD-10-CM | POA: Diagnosis not present

## 2024-02-13 DIAGNOSIS — L03115 Cellulitis of right lower limb: Secondary | ICD-10-CM | POA: Diagnosis not present

## 2024-02-15 DIAGNOSIS — S81801A Unspecified open wound, right lower leg, initial encounter: Secondary | ICD-10-CM | POA: Diagnosis not present

## 2024-02-16 DIAGNOSIS — Z9181 History of falling: Secondary | ICD-10-CM | POA: Diagnosis not present

## 2024-02-16 DIAGNOSIS — S81811D Laceration without foreign body, right lower leg, subsequent encounter: Secondary | ICD-10-CM | POA: Diagnosis not present

## 2024-02-16 DIAGNOSIS — N189 Chronic kidney disease, unspecified: Secondary | ICD-10-CM | POA: Diagnosis not present

## 2024-02-16 DIAGNOSIS — M8580 Other specified disorders of bone density and structure, unspecified site: Secondary | ICD-10-CM | POA: Diagnosis not present

## 2024-02-16 DIAGNOSIS — Z556 Problems related to health literacy: Secondary | ICD-10-CM | POA: Diagnosis not present

## 2024-02-16 DIAGNOSIS — E785 Hyperlipidemia, unspecified: Secondary | ICD-10-CM | POA: Diagnosis not present

## 2024-02-16 DIAGNOSIS — K219 Gastro-esophageal reflux disease without esophagitis: Secondary | ICD-10-CM | POA: Diagnosis not present

## 2024-02-16 DIAGNOSIS — F32A Depression, unspecified: Secondary | ICD-10-CM | POA: Diagnosis not present

## 2024-02-16 DIAGNOSIS — G894 Chronic pain syndrome: Secondary | ICD-10-CM | POA: Diagnosis not present

## 2024-02-16 DIAGNOSIS — N390 Urinary tract infection, site not specified: Secondary | ICD-10-CM | POA: Diagnosis not present

## 2024-02-16 DIAGNOSIS — Z9981 Dependence on supplemental oxygen: Secondary | ICD-10-CM | POA: Diagnosis not present

## 2024-02-16 DIAGNOSIS — M545 Low back pain, unspecified: Secondary | ICD-10-CM | POA: Diagnosis not present

## 2024-02-16 DIAGNOSIS — F419 Anxiety disorder, unspecified: Secondary | ICD-10-CM | POA: Diagnosis not present

## 2024-02-16 DIAGNOSIS — I509 Heart failure, unspecified: Secondary | ICD-10-CM | POA: Diagnosis not present

## 2024-02-16 DIAGNOSIS — I130 Hypertensive heart and chronic kidney disease with heart failure and stage 1 through stage 4 chronic kidney disease, or unspecified chronic kidney disease: Secondary | ICD-10-CM | POA: Diagnosis not present

## 2024-02-16 DIAGNOSIS — D631 Anemia in chronic kidney disease: Secondary | ICD-10-CM | POA: Diagnosis not present

## 2024-02-16 DIAGNOSIS — E559 Vitamin D deficiency, unspecified: Secondary | ICD-10-CM | POA: Diagnosis not present

## 2024-02-16 DIAGNOSIS — Z604 Social exclusion and rejection: Secondary | ICD-10-CM | POA: Diagnosis not present

## 2024-02-20 DIAGNOSIS — F419 Anxiety disorder, unspecified: Secondary | ICD-10-CM | POA: Diagnosis not present

## 2024-02-20 DIAGNOSIS — I5022 Chronic systolic (congestive) heart failure: Secondary | ICD-10-CM | POA: Diagnosis not present

## 2024-02-20 DIAGNOSIS — I130 Hypertensive heart and chronic kidney disease with heart failure and stage 1 through stage 4 chronic kidney disease, or unspecified chronic kidney disease: Secondary | ICD-10-CM | POA: Diagnosis not present

## 2024-02-20 DIAGNOSIS — F32A Depression, unspecified: Secondary | ICD-10-CM | POA: Diagnosis not present

## 2024-02-20 DIAGNOSIS — K219 Gastro-esophageal reflux disease without esophagitis: Secondary | ICD-10-CM | POA: Diagnosis not present

## 2024-02-20 DIAGNOSIS — Z556 Problems related to health literacy: Secondary | ICD-10-CM | POA: Diagnosis not present

## 2024-02-20 DIAGNOSIS — N390 Urinary tract infection, site not specified: Secondary | ICD-10-CM | POA: Diagnosis not present

## 2024-02-20 DIAGNOSIS — S81811D Laceration without foreign body, right lower leg, subsequent encounter: Secondary | ICD-10-CM | POA: Diagnosis not present

## 2024-02-20 DIAGNOSIS — D631 Anemia in chronic kidney disease: Secondary | ICD-10-CM | POA: Diagnosis not present

## 2024-02-20 DIAGNOSIS — I509 Heart failure, unspecified: Secondary | ICD-10-CM | POA: Diagnosis not present

## 2024-02-20 DIAGNOSIS — N189 Chronic kidney disease, unspecified: Secondary | ICD-10-CM | POA: Diagnosis not present

## 2024-02-20 DIAGNOSIS — M8580 Other specified disorders of bone density and structure, unspecified site: Secondary | ICD-10-CM | POA: Diagnosis not present

## 2024-02-20 DIAGNOSIS — Z604 Social exclusion and rejection: Secondary | ICD-10-CM | POA: Diagnosis not present

## 2024-02-20 DIAGNOSIS — E785 Hyperlipidemia, unspecified: Secondary | ICD-10-CM | POA: Diagnosis not present

## 2024-02-20 DIAGNOSIS — M545 Low back pain, unspecified: Secondary | ICD-10-CM | POA: Diagnosis not present

## 2024-02-20 DIAGNOSIS — G894 Chronic pain syndrome: Secondary | ICD-10-CM | POA: Diagnosis not present

## 2024-02-20 DIAGNOSIS — Z9181 History of falling: Secondary | ICD-10-CM | POA: Diagnosis not present

## 2024-02-20 DIAGNOSIS — E559 Vitamin D deficiency, unspecified: Secondary | ICD-10-CM | POA: Diagnosis not present

## 2024-02-20 DIAGNOSIS — L03115 Cellulitis of right lower limb: Secondary | ICD-10-CM | POA: Diagnosis not present

## 2024-02-20 DIAGNOSIS — Z9981 Dependence on supplemental oxygen: Secondary | ICD-10-CM | POA: Diagnosis not present

## 2024-02-22 DIAGNOSIS — N189 Chronic kidney disease, unspecified: Secondary | ICD-10-CM | POA: Diagnosis not present

## 2024-02-22 DIAGNOSIS — K219 Gastro-esophageal reflux disease without esophagitis: Secondary | ICD-10-CM | POA: Diagnosis not present

## 2024-02-22 DIAGNOSIS — M8580 Other specified disorders of bone density and structure, unspecified site: Secondary | ICD-10-CM | POA: Diagnosis not present

## 2024-02-22 DIAGNOSIS — E559 Vitamin D deficiency, unspecified: Secondary | ICD-10-CM | POA: Diagnosis not present

## 2024-02-22 DIAGNOSIS — Z9981 Dependence on supplemental oxygen: Secondary | ICD-10-CM | POA: Diagnosis not present

## 2024-02-22 DIAGNOSIS — M545 Low back pain, unspecified: Secondary | ICD-10-CM | POA: Diagnosis not present

## 2024-02-22 DIAGNOSIS — Z9181 History of falling: Secondary | ICD-10-CM | POA: Diagnosis not present

## 2024-02-22 DIAGNOSIS — F32A Depression, unspecified: Secondary | ICD-10-CM | POA: Diagnosis not present

## 2024-02-22 DIAGNOSIS — G894 Chronic pain syndrome: Secondary | ICD-10-CM | POA: Diagnosis not present

## 2024-02-22 DIAGNOSIS — E785 Hyperlipidemia, unspecified: Secondary | ICD-10-CM | POA: Diagnosis not present

## 2024-02-22 DIAGNOSIS — F419 Anxiety disorder, unspecified: Secondary | ICD-10-CM | POA: Diagnosis not present

## 2024-02-22 DIAGNOSIS — I130 Hypertensive heart and chronic kidney disease with heart failure and stage 1 through stage 4 chronic kidney disease, or unspecified chronic kidney disease: Secondary | ICD-10-CM | POA: Diagnosis not present

## 2024-02-22 DIAGNOSIS — N390 Urinary tract infection, site not specified: Secondary | ICD-10-CM | POA: Diagnosis not present

## 2024-02-22 DIAGNOSIS — Z604 Social exclusion and rejection: Secondary | ICD-10-CM | POA: Diagnosis not present

## 2024-02-22 DIAGNOSIS — D631 Anemia in chronic kidney disease: Secondary | ICD-10-CM | POA: Diagnosis not present

## 2024-02-22 DIAGNOSIS — Z556 Problems related to health literacy: Secondary | ICD-10-CM | POA: Diagnosis not present

## 2024-02-22 DIAGNOSIS — S81811D Laceration without foreign body, right lower leg, subsequent encounter: Secondary | ICD-10-CM | POA: Diagnosis not present

## 2024-02-22 DIAGNOSIS — I509 Heart failure, unspecified: Secondary | ICD-10-CM | POA: Diagnosis not present

## 2024-02-27 DIAGNOSIS — F32A Depression, unspecified: Secondary | ICD-10-CM | POA: Diagnosis not present

## 2024-02-27 DIAGNOSIS — R0902 Hypoxemia: Secondary | ICD-10-CM | POA: Diagnosis not present

## 2024-02-27 DIAGNOSIS — N189 Chronic kidney disease, unspecified: Secondary | ICD-10-CM | POA: Diagnosis not present

## 2024-02-27 DIAGNOSIS — E559 Vitamin D deficiency, unspecified: Secondary | ICD-10-CM | POA: Diagnosis not present

## 2024-02-27 DIAGNOSIS — R918 Other nonspecific abnormal finding of lung field: Secondary | ICD-10-CM | POA: Diagnosis not present

## 2024-02-27 DIAGNOSIS — M545 Low back pain, unspecified: Secondary | ICD-10-CM | POA: Diagnosis not present

## 2024-02-27 DIAGNOSIS — K219 Gastro-esophageal reflux disease without esophagitis: Secondary | ICD-10-CM | POA: Diagnosis not present

## 2024-02-27 DIAGNOSIS — Z9981 Dependence on supplemental oxygen: Secondary | ICD-10-CM | POA: Diagnosis not present

## 2024-02-27 DIAGNOSIS — M8580 Other specified disorders of bone density and structure, unspecified site: Secondary | ICD-10-CM | POA: Diagnosis not present

## 2024-02-27 DIAGNOSIS — R059 Cough, unspecified: Secondary | ICD-10-CM | POA: Diagnosis not present

## 2024-02-27 DIAGNOSIS — I509 Heart failure, unspecified: Secondary | ICD-10-CM | POA: Diagnosis not present

## 2024-02-27 DIAGNOSIS — D631 Anemia in chronic kidney disease: Secondary | ICD-10-CM | POA: Diagnosis not present

## 2024-02-27 DIAGNOSIS — N390 Urinary tract infection, site not specified: Secondary | ICD-10-CM | POA: Diagnosis not present

## 2024-02-27 DIAGNOSIS — S81811D Laceration without foreign body, right lower leg, subsequent encounter: Secondary | ICD-10-CM | POA: Diagnosis not present

## 2024-02-27 DIAGNOSIS — R0602 Shortness of breath: Secondary | ICD-10-CM | POA: Diagnosis not present

## 2024-02-27 DIAGNOSIS — G894 Chronic pain syndrome: Secondary | ICD-10-CM | POA: Diagnosis not present

## 2024-02-27 DIAGNOSIS — Z556 Problems related to health literacy: Secondary | ICD-10-CM | POA: Diagnosis not present

## 2024-02-27 DIAGNOSIS — Z9181 History of falling: Secondary | ICD-10-CM | POA: Diagnosis not present

## 2024-02-27 DIAGNOSIS — Z604 Social exclusion and rejection: Secondary | ICD-10-CM | POA: Diagnosis not present

## 2024-02-27 DIAGNOSIS — F419 Anxiety disorder, unspecified: Secondary | ICD-10-CM | POA: Diagnosis not present

## 2024-02-27 DIAGNOSIS — I130 Hypertensive heart and chronic kidney disease with heart failure and stage 1 through stage 4 chronic kidney disease, or unspecified chronic kidney disease: Secondary | ICD-10-CM | POA: Diagnosis not present

## 2024-02-27 DIAGNOSIS — R062 Wheezing: Secondary | ICD-10-CM | POA: Diagnosis not present

## 2024-02-27 DIAGNOSIS — E785 Hyperlipidemia, unspecified: Secondary | ICD-10-CM | POA: Diagnosis not present

## 2024-02-27 DIAGNOSIS — J189 Pneumonia, unspecified organism: Secondary | ICD-10-CM | POA: Diagnosis not present

## 2024-02-28 DIAGNOSIS — F329 Major depressive disorder, single episode, unspecified: Secondary | ICD-10-CM | POA: Diagnosis not present

## 2024-02-28 DIAGNOSIS — F419 Anxiety disorder, unspecified: Secondary | ICD-10-CM | POA: Diagnosis not present

## 2024-02-29 DIAGNOSIS — N189 Chronic kidney disease, unspecified: Secondary | ICD-10-CM | POA: Diagnosis not present

## 2024-02-29 DIAGNOSIS — F419 Anxiety disorder, unspecified: Secondary | ICD-10-CM | POA: Diagnosis not present

## 2024-02-29 DIAGNOSIS — G894 Chronic pain syndrome: Secondary | ICD-10-CM | POA: Diagnosis not present

## 2024-02-29 DIAGNOSIS — I130 Hypertensive heart and chronic kidney disease with heart failure and stage 1 through stage 4 chronic kidney disease, or unspecified chronic kidney disease: Secondary | ICD-10-CM | POA: Diagnosis not present

## 2024-02-29 DIAGNOSIS — D631 Anemia in chronic kidney disease: Secondary | ICD-10-CM | POA: Diagnosis not present

## 2024-02-29 DIAGNOSIS — N390 Urinary tract infection, site not specified: Secondary | ICD-10-CM | POA: Diagnosis not present

## 2024-02-29 DIAGNOSIS — M8580 Other specified disorders of bone density and structure, unspecified site: Secondary | ICD-10-CM | POA: Diagnosis not present

## 2024-02-29 DIAGNOSIS — K219 Gastro-esophageal reflux disease without esophagitis: Secondary | ICD-10-CM | POA: Diagnosis not present

## 2024-02-29 DIAGNOSIS — M545 Low back pain, unspecified: Secondary | ICD-10-CM | POA: Diagnosis not present

## 2024-02-29 DIAGNOSIS — I509 Heart failure, unspecified: Secondary | ICD-10-CM | POA: Diagnosis not present

## 2024-02-29 DIAGNOSIS — Z556 Problems related to health literacy: Secondary | ICD-10-CM | POA: Diagnosis not present

## 2024-02-29 DIAGNOSIS — F32A Depression, unspecified: Secondary | ICD-10-CM | POA: Diagnosis not present

## 2024-02-29 DIAGNOSIS — Z604 Social exclusion and rejection: Secondary | ICD-10-CM | POA: Diagnosis not present

## 2024-02-29 DIAGNOSIS — E785 Hyperlipidemia, unspecified: Secondary | ICD-10-CM | POA: Diagnosis not present

## 2024-02-29 DIAGNOSIS — Z9981 Dependence on supplemental oxygen: Secondary | ICD-10-CM | POA: Diagnosis not present

## 2024-02-29 DIAGNOSIS — Z9181 History of falling: Secondary | ICD-10-CM | POA: Diagnosis not present

## 2024-02-29 DIAGNOSIS — E559 Vitamin D deficiency, unspecified: Secondary | ICD-10-CM | POA: Diagnosis not present

## 2024-02-29 DIAGNOSIS — S81811D Laceration without foreign body, right lower leg, subsequent encounter: Secondary | ICD-10-CM | POA: Diagnosis not present

## 2024-03-01 DIAGNOSIS — S81801A Unspecified open wound, right lower leg, initial encounter: Secondary | ICD-10-CM | POA: Diagnosis not present

## 2024-03-02 DIAGNOSIS — I5022 Chronic systolic (congestive) heart failure: Secondary | ICD-10-CM | POA: Diagnosis not present

## 2024-03-02 DIAGNOSIS — D649 Anemia, unspecified: Secondary | ICD-10-CM | POA: Diagnosis not present

## 2024-03-05 DIAGNOSIS — Z604 Social exclusion and rejection: Secondary | ICD-10-CM | POA: Diagnosis not present

## 2024-03-05 DIAGNOSIS — G894 Chronic pain syndrome: Secondary | ICD-10-CM | POA: Diagnosis not present

## 2024-03-05 DIAGNOSIS — S81811D Laceration without foreign body, right lower leg, subsequent encounter: Secondary | ICD-10-CM | POA: Diagnosis not present

## 2024-03-05 DIAGNOSIS — N390 Urinary tract infection, site not specified: Secondary | ICD-10-CM | POA: Diagnosis not present

## 2024-03-05 DIAGNOSIS — I509 Heart failure, unspecified: Secondary | ICD-10-CM | POA: Diagnosis not present

## 2024-03-05 DIAGNOSIS — Z556 Problems related to health literacy: Secondary | ICD-10-CM | POA: Diagnosis not present

## 2024-03-05 DIAGNOSIS — M8580 Other specified disorders of bone density and structure, unspecified site: Secondary | ICD-10-CM | POA: Diagnosis not present

## 2024-03-05 DIAGNOSIS — K219 Gastro-esophageal reflux disease without esophagitis: Secondary | ICD-10-CM | POA: Diagnosis not present

## 2024-03-05 DIAGNOSIS — D631 Anemia in chronic kidney disease: Secondary | ICD-10-CM | POA: Diagnosis not present

## 2024-03-05 DIAGNOSIS — M545 Low back pain, unspecified: Secondary | ICD-10-CM | POA: Diagnosis not present

## 2024-03-05 DIAGNOSIS — J189 Pneumonia, unspecified organism: Secondary | ICD-10-CM | POA: Diagnosis not present

## 2024-03-05 DIAGNOSIS — Z9981 Dependence on supplemental oxygen: Secondary | ICD-10-CM | POA: Diagnosis not present

## 2024-03-05 DIAGNOSIS — F419 Anxiety disorder, unspecified: Secondary | ICD-10-CM | POA: Diagnosis not present

## 2024-03-05 DIAGNOSIS — I130 Hypertensive heart and chronic kidney disease with heart failure and stage 1 through stage 4 chronic kidney disease, or unspecified chronic kidney disease: Secondary | ICD-10-CM | POA: Diagnosis not present

## 2024-03-05 DIAGNOSIS — F32A Depression, unspecified: Secondary | ICD-10-CM | POA: Diagnosis not present

## 2024-03-05 DIAGNOSIS — E785 Hyperlipidemia, unspecified: Secondary | ICD-10-CM | POA: Diagnosis not present

## 2024-03-05 DIAGNOSIS — N189 Chronic kidney disease, unspecified: Secondary | ICD-10-CM | POA: Diagnosis not present

## 2024-03-05 DIAGNOSIS — Z9181 History of falling: Secondary | ICD-10-CM | POA: Diagnosis not present

## 2024-03-05 DIAGNOSIS — E559 Vitamin D deficiency, unspecified: Secondary | ICD-10-CM | POA: Diagnosis not present

## 2024-03-05 DIAGNOSIS — R0902 Hypoxemia: Secondary | ICD-10-CM | POA: Diagnosis not present

## 2024-03-07 DIAGNOSIS — I130 Hypertensive heart and chronic kidney disease with heart failure and stage 1 through stage 4 chronic kidney disease, or unspecified chronic kidney disease: Secondary | ICD-10-CM | POA: Diagnosis not present

## 2024-03-07 DIAGNOSIS — E559 Vitamin D deficiency, unspecified: Secondary | ICD-10-CM | POA: Diagnosis not present

## 2024-03-07 DIAGNOSIS — Z604 Social exclusion and rejection: Secondary | ICD-10-CM | POA: Diagnosis not present

## 2024-03-07 DIAGNOSIS — Z9181 History of falling: Secondary | ICD-10-CM | POA: Diagnosis not present

## 2024-03-07 DIAGNOSIS — N189 Chronic kidney disease, unspecified: Secondary | ICD-10-CM | POA: Diagnosis not present

## 2024-03-07 DIAGNOSIS — Z556 Problems related to health literacy: Secondary | ICD-10-CM | POA: Diagnosis not present

## 2024-03-07 DIAGNOSIS — F32A Depression, unspecified: Secondary | ICD-10-CM | POA: Diagnosis not present

## 2024-03-07 DIAGNOSIS — S81811D Laceration without foreign body, right lower leg, subsequent encounter: Secondary | ICD-10-CM | POA: Diagnosis not present

## 2024-03-07 DIAGNOSIS — M545 Low back pain, unspecified: Secondary | ICD-10-CM | POA: Diagnosis not present

## 2024-03-07 DIAGNOSIS — D631 Anemia in chronic kidney disease: Secondary | ICD-10-CM | POA: Diagnosis not present

## 2024-03-07 DIAGNOSIS — M8580 Other specified disorders of bone density and structure, unspecified site: Secondary | ICD-10-CM | POA: Diagnosis not present

## 2024-03-07 DIAGNOSIS — Z9981 Dependence on supplemental oxygen: Secondary | ICD-10-CM | POA: Diagnosis not present

## 2024-03-07 DIAGNOSIS — K219 Gastro-esophageal reflux disease without esophagitis: Secondary | ICD-10-CM | POA: Diagnosis not present

## 2024-03-07 DIAGNOSIS — E785 Hyperlipidemia, unspecified: Secondary | ICD-10-CM | POA: Diagnosis not present

## 2024-03-07 DIAGNOSIS — F419 Anxiety disorder, unspecified: Secondary | ICD-10-CM | POA: Diagnosis not present

## 2024-03-07 DIAGNOSIS — N390 Urinary tract infection, site not specified: Secondary | ICD-10-CM | POA: Diagnosis not present

## 2024-03-07 DIAGNOSIS — I509 Heart failure, unspecified: Secondary | ICD-10-CM | POA: Diagnosis not present

## 2024-03-07 DIAGNOSIS — G894 Chronic pain syndrome: Secondary | ICD-10-CM | POA: Diagnosis not present

## 2024-03-12 DIAGNOSIS — D631 Anemia in chronic kidney disease: Secondary | ICD-10-CM | POA: Diagnosis not present

## 2024-03-12 DIAGNOSIS — Z604 Social exclusion and rejection: Secondary | ICD-10-CM | POA: Diagnosis not present

## 2024-03-12 DIAGNOSIS — I509 Heart failure, unspecified: Secondary | ICD-10-CM | POA: Diagnosis not present

## 2024-03-12 DIAGNOSIS — M8580 Other specified disorders of bone density and structure, unspecified site: Secondary | ICD-10-CM | POA: Diagnosis not present

## 2024-03-12 DIAGNOSIS — M545 Low back pain, unspecified: Secondary | ICD-10-CM | POA: Diagnosis not present

## 2024-03-12 DIAGNOSIS — Z556 Problems related to health literacy: Secondary | ICD-10-CM | POA: Diagnosis not present

## 2024-03-12 DIAGNOSIS — F32A Depression, unspecified: Secondary | ICD-10-CM | POA: Diagnosis not present

## 2024-03-12 DIAGNOSIS — F419 Anxiety disorder, unspecified: Secondary | ICD-10-CM | POA: Diagnosis not present

## 2024-03-12 DIAGNOSIS — S81811D Laceration without foreign body, right lower leg, subsequent encounter: Secondary | ICD-10-CM | POA: Diagnosis not present

## 2024-03-12 DIAGNOSIS — E559 Vitamin D deficiency, unspecified: Secondary | ICD-10-CM | POA: Diagnosis not present

## 2024-03-12 DIAGNOSIS — R0602 Shortness of breath: Secondary | ICD-10-CM | POA: Diagnosis not present

## 2024-03-12 DIAGNOSIS — Z9981 Dependence on supplemental oxygen: Secondary | ICD-10-CM | POA: Diagnosis not present

## 2024-03-12 DIAGNOSIS — G894 Chronic pain syndrome: Secondary | ICD-10-CM | POA: Diagnosis not present

## 2024-03-12 DIAGNOSIS — Z9181 History of falling: Secondary | ICD-10-CM | POA: Diagnosis not present

## 2024-03-12 DIAGNOSIS — N390 Urinary tract infection, site not specified: Secondary | ICD-10-CM | POA: Diagnosis not present

## 2024-03-12 DIAGNOSIS — E785 Hyperlipidemia, unspecified: Secondary | ICD-10-CM | POA: Diagnosis not present

## 2024-03-12 DIAGNOSIS — K219 Gastro-esophageal reflux disease without esophagitis: Secondary | ICD-10-CM | POA: Diagnosis not present

## 2024-03-12 DIAGNOSIS — J449 Chronic obstructive pulmonary disease, unspecified: Secondary | ICD-10-CM | POA: Diagnosis not present

## 2024-03-12 DIAGNOSIS — I130 Hypertensive heart and chronic kidney disease with heart failure and stage 1 through stage 4 chronic kidney disease, or unspecified chronic kidney disease: Secondary | ICD-10-CM | POA: Diagnosis not present

## 2024-03-12 DIAGNOSIS — N189 Chronic kidney disease, unspecified: Secondary | ICD-10-CM | POA: Diagnosis not present

## 2024-03-13 DIAGNOSIS — D631 Anemia in chronic kidney disease: Secondary | ICD-10-CM | POA: Diagnosis not present

## 2024-03-13 DIAGNOSIS — Z9181 History of falling: Secondary | ICD-10-CM | POA: Diagnosis not present

## 2024-03-13 DIAGNOSIS — M8580 Other specified disorders of bone density and structure, unspecified site: Secondary | ICD-10-CM | POA: Diagnosis not present

## 2024-03-13 DIAGNOSIS — F419 Anxiety disorder, unspecified: Secondary | ICD-10-CM | POA: Diagnosis not present

## 2024-03-13 DIAGNOSIS — S81811D Laceration without foreign body, right lower leg, subsequent encounter: Secondary | ICD-10-CM | POA: Diagnosis not present

## 2024-03-13 DIAGNOSIS — E785 Hyperlipidemia, unspecified: Secondary | ICD-10-CM | POA: Diagnosis not present

## 2024-03-13 DIAGNOSIS — Z9981 Dependence on supplemental oxygen: Secondary | ICD-10-CM | POA: Diagnosis not present

## 2024-03-13 DIAGNOSIS — K219 Gastro-esophageal reflux disease without esophagitis: Secondary | ICD-10-CM | POA: Diagnosis not present

## 2024-03-13 DIAGNOSIS — I509 Heart failure, unspecified: Secondary | ICD-10-CM | POA: Diagnosis not present

## 2024-03-13 DIAGNOSIS — N189 Chronic kidney disease, unspecified: Secondary | ICD-10-CM | POA: Diagnosis not present

## 2024-03-13 DIAGNOSIS — G894 Chronic pain syndrome: Secondary | ICD-10-CM | POA: Diagnosis not present

## 2024-03-13 DIAGNOSIS — M545 Low back pain, unspecified: Secondary | ICD-10-CM | POA: Diagnosis not present

## 2024-03-13 DIAGNOSIS — R4182 Altered mental status, unspecified: Secondary | ICD-10-CM | POA: Diagnosis not present

## 2024-03-13 DIAGNOSIS — N390 Urinary tract infection, site not specified: Secondary | ICD-10-CM | POA: Diagnosis not present

## 2024-03-13 DIAGNOSIS — E559 Vitamin D deficiency, unspecified: Secondary | ICD-10-CM | POA: Diagnosis not present

## 2024-03-13 DIAGNOSIS — F32A Depression, unspecified: Secondary | ICD-10-CM | POA: Diagnosis not present

## 2024-03-13 DIAGNOSIS — I130 Hypertensive heart and chronic kidney disease with heart failure and stage 1 through stage 4 chronic kidney disease, or unspecified chronic kidney disease: Secondary | ICD-10-CM | POA: Diagnosis not present

## 2024-03-13 DIAGNOSIS — Z604 Social exclusion and rejection: Secondary | ICD-10-CM | POA: Diagnosis not present

## 2024-03-13 DIAGNOSIS — Z556 Problems related to health literacy: Secondary | ICD-10-CM | POA: Diagnosis not present

## 2024-03-15 DIAGNOSIS — M545 Low back pain, unspecified: Secondary | ICD-10-CM | POA: Diagnosis not present

## 2024-03-15 DIAGNOSIS — S81811D Laceration without foreign body, right lower leg, subsequent encounter: Secondary | ICD-10-CM | POA: Diagnosis not present

## 2024-03-15 DIAGNOSIS — N189 Chronic kidney disease, unspecified: Secondary | ICD-10-CM | POA: Diagnosis not present

## 2024-03-15 DIAGNOSIS — D631 Anemia in chronic kidney disease: Secondary | ICD-10-CM | POA: Diagnosis not present

## 2024-03-15 DIAGNOSIS — E559 Vitamin D deficiency, unspecified: Secondary | ICD-10-CM | POA: Diagnosis not present

## 2024-03-15 DIAGNOSIS — Z556 Problems related to health literacy: Secondary | ICD-10-CM | POA: Diagnosis not present

## 2024-03-15 DIAGNOSIS — Z9181 History of falling: Secondary | ICD-10-CM | POA: Diagnosis not present

## 2024-03-15 DIAGNOSIS — Z9981 Dependence on supplemental oxygen: Secondary | ICD-10-CM | POA: Diagnosis not present

## 2024-03-15 DIAGNOSIS — F32A Depression, unspecified: Secondary | ICD-10-CM | POA: Diagnosis not present

## 2024-03-15 DIAGNOSIS — E785 Hyperlipidemia, unspecified: Secondary | ICD-10-CM | POA: Diagnosis not present

## 2024-03-15 DIAGNOSIS — M8580 Other specified disorders of bone density and structure, unspecified site: Secondary | ICD-10-CM | POA: Diagnosis not present

## 2024-03-15 DIAGNOSIS — I130 Hypertensive heart and chronic kidney disease with heart failure and stage 1 through stage 4 chronic kidney disease, or unspecified chronic kidney disease: Secondary | ICD-10-CM | POA: Diagnosis not present

## 2024-03-15 DIAGNOSIS — K219 Gastro-esophageal reflux disease without esophagitis: Secondary | ICD-10-CM | POA: Diagnosis not present

## 2024-03-15 DIAGNOSIS — F419 Anxiety disorder, unspecified: Secondary | ICD-10-CM | POA: Diagnosis not present

## 2024-03-15 DIAGNOSIS — N390 Urinary tract infection, site not specified: Secondary | ICD-10-CM | POA: Diagnosis not present

## 2024-03-15 DIAGNOSIS — I509 Heart failure, unspecified: Secondary | ICD-10-CM | POA: Diagnosis not present

## 2024-03-15 DIAGNOSIS — G894 Chronic pain syndrome: Secondary | ICD-10-CM | POA: Diagnosis not present

## 2024-03-15 DIAGNOSIS — Z604 Social exclusion and rejection: Secondary | ICD-10-CM | POA: Diagnosis not present

## 2024-03-16 DIAGNOSIS — N390 Urinary tract infection, site not specified: Secondary | ICD-10-CM | POA: Diagnosis not present

## 2024-03-16 DIAGNOSIS — R410 Disorientation, unspecified: Secondary | ICD-10-CM | POA: Diagnosis not present

## 2024-03-19 DIAGNOSIS — K219 Gastro-esophageal reflux disease without esophagitis: Secondary | ICD-10-CM | POA: Diagnosis not present

## 2024-03-19 DIAGNOSIS — E876 Hypokalemia: Secondary | ICD-10-CM | POA: Diagnosis not present

## 2024-03-19 DIAGNOSIS — S81811D Laceration without foreign body, right lower leg, subsequent encounter: Secondary | ICD-10-CM | POA: Diagnosis not present

## 2024-03-19 DIAGNOSIS — I130 Hypertensive heart and chronic kidney disease with heart failure and stage 1 through stage 4 chronic kidney disease, or unspecified chronic kidney disease: Secondary | ICD-10-CM | POA: Diagnosis not present

## 2024-03-19 DIAGNOSIS — I5022 Chronic systolic (congestive) heart failure: Secondary | ICD-10-CM | POA: Diagnosis not present

## 2024-03-19 DIAGNOSIS — M545 Low back pain, unspecified: Secondary | ICD-10-CM | POA: Diagnosis not present

## 2024-03-19 DIAGNOSIS — F32A Depression, unspecified: Secondary | ICD-10-CM | POA: Diagnosis not present

## 2024-03-19 DIAGNOSIS — Z604 Social exclusion and rejection: Secondary | ICD-10-CM | POA: Diagnosis not present

## 2024-03-19 DIAGNOSIS — N390 Urinary tract infection, site not specified: Secondary | ICD-10-CM | POA: Diagnosis not present

## 2024-03-19 DIAGNOSIS — Z9181 History of falling: Secondary | ICD-10-CM | POA: Diagnosis not present

## 2024-03-19 DIAGNOSIS — Z556 Problems related to health literacy: Secondary | ICD-10-CM | POA: Diagnosis not present

## 2024-03-19 DIAGNOSIS — D631 Anemia in chronic kidney disease: Secondary | ICD-10-CM | POA: Diagnosis not present

## 2024-03-19 DIAGNOSIS — E559 Vitamin D deficiency, unspecified: Secondary | ICD-10-CM | POA: Diagnosis not present

## 2024-03-19 DIAGNOSIS — M8580 Other specified disorders of bone density and structure, unspecified site: Secondary | ICD-10-CM | POA: Diagnosis not present

## 2024-03-19 DIAGNOSIS — N189 Chronic kidney disease, unspecified: Secondary | ICD-10-CM | POA: Diagnosis not present

## 2024-03-19 DIAGNOSIS — Z9981 Dependence on supplemental oxygen: Secondary | ICD-10-CM | POA: Diagnosis not present

## 2024-03-19 DIAGNOSIS — G894 Chronic pain syndrome: Secondary | ICD-10-CM | POA: Diagnosis not present

## 2024-03-19 DIAGNOSIS — F419 Anxiety disorder, unspecified: Secondary | ICD-10-CM | POA: Diagnosis not present

## 2024-03-19 DIAGNOSIS — E785 Hyperlipidemia, unspecified: Secondary | ICD-10-CM | POA: Diagnosis not present

## 2024-03-19 DIAGNOSIS — I509 Heart failure, unspecified: Secondary | ICD-10-CM | POA: Diagnosis not present

## 2024-03-21 DIAGNOSIS — D631 Anemia in chronic kidney disease: Secondary | ICD-10-CM | POA: Diagnosis not present

## 2024-03-21 DIAGNOSIS — S81811D Laceration without foreign body, right lower leg, subsequent encounter: Secondary | ICD-10-CM | POA: Diagnosis not present

## 2024-03-21 DIAGNOSIS — N390 Urinary tract infection, site not specified: Secondary | ICD-10-CM | POA: Diagnosis not present

## 2024-03-21 DIAGNOSIS — Z604 Social exclusion and rejection: Secondary | ICD-10-CM | POA: Diagnosis not present

## 2024-03-21 DIAGNOSIS — N189 Chronic kidney disease, unspecified: Secondary | ICD-10-CM | POA: Diagnosis not present

## 2024-03-21 DIAGNOSIS — I130 Hypertensive heart and chronic kidney disease with heart failure and stage 1 through stage 4 chronic kidney disease, or unspecified chronic kidney disease: Secondary | ICD-10-CM | POA: Diagnosis not present

## 2024-03-21 DIAGNOSIS — E559 Vitamin D deficiency, unspecified: Secondary | ICD-10-CM | POA: Diagnosis not present

## 2024-03-21 DIAGNOSIS — F32A Depression, unspecified: Secondary | ICD-10-CM | POA: Diagnosis not present

## 2024-03-21 DIAGNOSIS — K219 Gastro-esophageal reflux disease without esophagitis: Secondary | ICD-10-CM | POA: Diagnosis not present

## 2024-03-21 DIAGNOSIS — F419 Anxiety disorder, unspecified: Secondary | ICD-10-CM | POA: Diagnosis not present

## 2024-03-21 DIAGNOSIS — G894 Chronic pain syndrome: Secondary | ICD-10-CM | POA: Diagnosis not present

## 2024-03-21 DIAGNOSIS — M545 Low back pain, unspecified: Secondary | ICD-10-CM | POA: Diagnosis not present

## 2024-03-21 DIAGNOSIS — Z9181 History of falling: Secondary | ICD-10-CM | POA: Diagnosis not present

## 2024-03-21 DIAGNOSIS — I509 Heart failure, unspecified: Secondary | ICD-10-CM | POA: Diagnosis not present

## 2024-03-21 DIAGNOSIS — Z556 Problems related to health literacy: Secondary | ICD-10-CM | POA: Diagnosis not present

## 2024-03-21 DIAGNOSIS — Z9981 Dependence on supplemental oxygen: Secondary | ICD-10-CM | POA: Diagnosis not present

## 2024-03-21 DIAGNOSIS — E785 Hyperlipidemia, unspecified: Secondary | ICD-10-CM | POA: Diagnosis not present

## 2024-03-21 DIAGNOSIS — M8580 Other specified disorders of bone density and structure, unspecified site: Secondary | ICD-10-CM | POA: Diagnosis not present

## 2024-03-26 DIAGNOSIS — K219 Gastro-esophageal reflux disease without esophagitis: Secondary | ICD-10-CM | POA: Diagnosis not present

## 2024-03-26 DIAGNOSIS — D519 Vitamin B12 deficiency anemia, unspecified: Secondary | ICD-10-CM | POA: Diagnosis not present

## 2024-03-26 DIAGNOSIS — S81811D Laceration without foreign body, right lower leg, subsequent encounter: Secondary | ICD-10-CM | POA: Diagnosis not present

## 2024-03-26 DIAGNOSIS — I509 Heart failure, unspecified: Secondary | ICD-10-CM | POA: Diagnosis not present

## 2024-03-26 DIAGNOSIS — N3000 Acute cystitis without hematuria: Secondary | ICD-10-CM | POA: Diagnosis not present

## 2024-03-26 DIAGNOSIS — Z9981 Dependence on supplemental oxygen: Secondary | ICD-10-CM | POA: Diagnosis not present

## 2024-03-26 DIAGNOSIS — M8580 Other specified disorders of bone density and structure, unspecified site: Secondary | ICD-10-CM | POA: Diagnosis not present

## 2024-03-26 DIAGNOSIS — N390 Urinary tract infection, site not specified: Secondary | ICD-10-CM | POA: Diagnosis not present

## 2024-03-26 DIAGNOSIS — F419 Anxiety disorder, unspecified: Secondary | ICD-10-CM | POA: Diagnosis not present

## 2024-03-26 DIAGNOSIS — D631 Anemia in chronic kidney disease: Secondary | ICD-10-CM | POA: Diagnosis not present

## 2024-03-26 DIAGNOSIS — D649 Anemia, unspecified: Secondary | ICD-10-CM | POA: Diagnosis not present

## 2024-03-26 DIAGNOSIS — N189 Chronic kidney disease, unspecified: Secondary | ICD-10-CM | POA: Diagnosis not present

## 2024-03-26 DIAGNOSIS — M545 Low back pain, unspecified: Secondary | ICD-10-CM | POA: Diagnosis not present

## 2024-03-26 DIAGNOSIS — Z9181 History of falling: Secondary | ICD-10-CM | POA: Diagnosis not present

## 2024-03-26 DIAGNOSIS — E559 Vitamin D deficiency, unspecified: Secondary | ICD-10-CM | POA: Diagnosis not present

## 2024-03-26 DIAGNOSIS — I130 Hypertensive heart and chronic kidney disease with heart failure and stage 1 through stage 4 chronic kidney disease, or unspecified chronic kidney disease: Secondary | ICD-10-CM | POA: Diagnosis not present

## 2024-03-26 DIAGNOSIS — Z556 Problems related to health literacy: Secondary | ICD-10-CM | POA: Diagnosis not present

## 2024-03-26 DIAGNOSIS — Z604 Social exclusion and rejection: Secondary | ICD-10-CM | POA: Diagnosis not present

## 2024-03-26 DIAGNOSIS — G894 Chronic pain syndrome: Secondary | ICD-10-CM | POA: Diagnosis not present

## 2024-03-26 DIAGNOSIS — E785 Hyperlipidemia, unspecified: Secondary | ICD-10-CM | POA: Diagnosis not present

## 2024-03-26 DIAGNOSIS — F32A Depression, unspecified: Secondary | ICD-10-CM | POA: Diagnosis not present

## 2024-03-27 DIAGNOSIS — D649 Anemia, unspecified: Secondary | ICD-10-CM | POA: Diagnosis not present

## 2024-03-27 DIAGNOSIS — M545 Low back pain, unspecified: Secondary | ICD-10-CM | POA: Diagnosis not present

## 2024-03-27 DIAGNOSIS — G894 Chronic pain syndrome: Secondary | ICD-10-CM | POA: Diagnosis not present

## 2024-03-27 DIAGNOSIS — E559 Vitamin D deficiency, unspecified: Secondary | ICD-10-CM | POA: Diagnosis not present

## 2024-03-27 DIAGNOSIS — N189 Chronic kidney disease, unspecified: Secondary | ICD-10-CM | POA: Diagnosis not present

## 2024-03-27 DIAGNOSIS — I509 Heart failure, unspecified: Secondary | ICD-10-CM | POA: Diagnosis not present

## 2024-03-27 DIAGNOSIS — N390 Urinary tract infection, site not specified: Secondary | ICD-10-CM | POA: Diagnosis not present

## 2024-03-27 DIAGNOSIS — Z556 Problems related to health literacy: Secondary | ICD-10-CM | POA: Diagnosis not present

## 2024-03-27 DIAGNOSIS — F32A Depression, unspecified: Secondary | ICD-10-CM | POA: Diagnosis not present

## 2024-03-27 DIAGNOSIS — K219 Gastro-esophageal reflux disease without esophagitis: Secondary | ICD-10-CM | POA: Diagnosis not present

## 2024-03-27 DIAGNOSIS — I130 Hypertensive heart and chronic kidney disease with heart failure and stage 1 through stage 4 chronic kidney disease, or unspecified chronic kidney disease: Secondary | ICD-10-CM | POA: Diagnosis not present

## 2024-03-27 DIAGNOSIS — D631 Anemia in chronic kidney disease: Secondary | ICD-10-CM | POA: Diagnosis not present

## 2024-03-27 DIAGNOSIS — M8580 Other specified disorders of bone density and structure, unspecified site: Secondary | ICD-10-CM | POA: Diagnosis not present

## 2024-03-27 DIAGNOSIS — F419 Anxiety disorder, unspecified: Secondary | ICD-10-CM | POA: Diagnosis not present

## 2024-03-27 DIAGNOSIS — F329 Major depressive disorder, single episode, unspecified: Secondary | ICD-10-CM | POA: Diagnosis not present

## 2024-03-27 DIAGNOSIS — E785 Hyperlipidemia, unspecified: Secondary | ICD-10-CM | POA: Diagnosis not present

## 2024-03-27 DIAGNOSIS — Z604 Social exclusion and rejection: Secondary | ICD-10-CM | POA: Diagnosis not present

## 2024-03-27 DIAGNOSIS — I5022 Chronic systolic (congestive) heart failure: Secondary | ICD-10-CM | POA: Diagnosis not present

## 2024-03-27 DIAGNOSIS — S81811D Laceration without foreign body, right lower leg, subsequent encounter: Secondary | ICD-10-CM | POA: Diagnosis not present

## 2024-03-27 DIAGNOSIS — Z9181 History of falling: Secondary | ICD-10-CM | POA: Diagnosis not present

## 2024-03-27 DIAGNOSIS — Z9981 Dependence on supplemental oxygen: Secondary | ICD-10-CM | POA: Diagnosis not present

## 2024-04-02 DIAGNOSIS — N189 Chronic kidney disease, unspecified: Secondary | ICD-10-CM | POA: Diagnosis not present

## 2024-04-02 DIAGNOSIS — I509 Heart failure, unspecified: Secondary | ICD-10-CM | POA: Diagnosis not present

## 2024-04-02 DIAGNOSIS — N390 Urinary tract infection, site not specified: Secondary | ICD-10-CM | POA: Diagnosis not present

## 2024-04-02 DIAGNOSIS — F32A Depression, unspecified: Secondary | ICD-10-CM | POA: Diagnosis not present

## 2024-04-02 DIAGNOSIS — I130 Hypertensive heart and chronic kidney disease with heart failure and stage 1 through stage 4 chronic kidney disease, or unspecified chronic kidney disease: Secondary | ICD-10-CM | POA: Diagnosis not present

## 2024-04-02 DIAGNOSIS — Z556 Problems related to health literacy: Secondary | ICD-10-CM | POA: Diagnosis not present

## 2024-04-02 DIAGNOSIS — E785 Hyperlipidemia, unspecified: Secondary | ICD-10-CM | POA: Diagnosis not present

## 2024-04-02 DIAGNOSIS — D631 Anemia in chronic kidney disease: Secondary | ICD-10-CM | POA: Diagnosis not present

## 2024-04-02 DIAGNOSIS — G894 Chronic pain syndrome: Secondary | ICD-10-CM | POA: Diagnosis not present

## 2024-04-02 DIAGNOSIS — S81811D Laceration without foreign body, right lower leg, subsequent encounter: Secondary | ICD-10-CM | POA: Diagnosis not present

## 2024-04-02 DIAGNOSIS — K219 Gastro-esophageal reflux disease without esophagitis: Secondary | ICD-10-CM | POA: Diagnosis not present

## 2024-04-02 DIAGNOSIS — M545 Low back pain, unspecified: Secondary | ICD-10-CM | POA: Diagnosis not present

## 2024-04-02 DIAGNOSIS — M8580 Other specified disorders of bone density and structure, unspecified site: Secondary | ICD-10-CM | POA: Diagnosis not present

## 2024-04-02 DIAGNOSIS — Z9981 Dependence on supplemental oxygen: Secondary | ICD-10-CM | POA: Diagnosis not present

## 2024-04-02 DIAGNOSIS — E559 Vitamin D deficiency, unspecified: Secondary | ICD-10-CM | POA: Diagnosis not present

## 2024-04-02 DIAGNOSIS — Z9181 History of falling: Secondary | ICD-10-CM | POA: Diagnosis not present

## 2024-04-02 DIAGNOSIS — Z604 Social exclusion and rejection: Secondary | ICD-10-CM | POA: Diagnosis not present

## 2024-04-02 DIAGNOSIS — F419 Anxiety disorder, unspecified: Secondary | ICD-10-CM | POA: Diagnosis not present

## 2024-04-04 DIAGNOSIS — I130 Hypertensive heart and chronic kidney disease with heart failure and stage 1 through stage 4 chronic kidney disease, or unspecified chronic kidney disease: Secondary | ICD-10-CM | POA: Diagnosis not present

## 2024-04-04 DIAGNOSIS — Z604 Social exclusion and rejection: Secondary | ICD-10-CM | POA: Diagnosis not present

## 2024-04-04 DIAGNOSIS — F419 Anxiety disorder, unspecified: Secondary | ICD-10-CM | POA: Diagnosis not present

## 2024-04-04 DIAGNOSIS — Z556 Problems related to health literacy: Secondary | ICD-10-CM | POA: Diagnosis not present

## 2024-04-04 DIAGNOSIS — S81811D Laceration without foreign body, right lower leg, subsequent encounter: Secondary | ICD-10-CM | POA: Diagnosis not present

## 2024-04-04 DIAGNOSIS — K219 Gastro-esophageal reflux disease without esophagitis: Secondary | ICD-10-CM | POA: Diagnosis not present

## 2024-04-04 DIAGNOSIS — M8580 Other specified disorders of bone density and structure, unspecified site: Secondary | ICD-10-CM | POA: Diagnosis not present

## 2024-04-04 DIAGNOSIS — E785 Hyperlipidemia, unspecified: Secondary | ICD-10-CM | POA: Diagnosis not present

## 2024-04-04 DIAGNOSIS — N189 Chronic kidney disease, unspecified: Secondary | ICD-10-CM | POA: Diagnosis not present

## 2024-04-04 DIAGNOSIS — G894 Chronic pain syndrome: Secondary | ICD-10-CM | POA: Diagnosis not present

## 2024-04-04 DIAGNOSIS — M545 Low back pain, unspecified: Secondary | ICD-10-CM | POA: Diagnosis not present

## 2024-04-04 DIAGNOSIS — D631 Anemia in chronic kidney disease: Secondary | ICD-10-CM | POA: Diagnosis not present

## 2024-04-04 DIAGNOSIS — I509 Heart failure, unspecified: Secondary | ICD-10-CM | POA: Diagnosis not present

## 2024-04-04 DIAGNOSIS — Z9981 Dependence on supplemental oxygen: Secondary | ICD-10-CM | POA: Diagnosis not present

## 2024-04-04 DIAGNOSIS — F32A Depression, unspecified: Secondary | ICD-10-CM | POA: Diagnosis not present

## 2024-04-04 DIAGNOSIS — E559 Vitamin D deficiency, unspecified: Secondary | ICD-10-CM | POA: Diagnosis not present

## 2024-04-04 DIAGNOSIS — N390 Urinary tract infection, site not specified: Secondary | ICD-10-CM | POA: Diagnosis not present

## 2024-04-04 DIAGNOSIS — Z9181 History of falling: Secondary | ICD-10-CM | POA: Diagnosis not present

## 2024-04-09 DIAGNOSIS — M545 Low back pain, unspecified: Secondary | ICD-10-CM | POA: Diagnosis not present

## 2024-04-09 DIAGNOSIS — G894 Chronic pain syndrome: Secondary | ICD-10-CM | POA: Diagnosis not present

## 2024-04-09 DIAGNOSIS — Z9181 History of falling: Secondary | ICD-10-CM | POA: Diagnosis not present

## 2024-04-09 DIAGNOSIS — Z604 Social exclusion and rejection: Secondary | ICD-10-CM | POA: Diagnosis not present

## 2024-04-09 DIAGNOSIS — Z9981 Dependence on supplemental oxygen: Secondary | ICD-10-CM | POA: Diagnosis not present

## 2024-04-09 DIAGNOSIS — E785 Hyperlipidemia, unspecified: Secondary | ICD-10-CM | POA: Diagnosis not present

## 2024-04-09 DIAGNOSIS — N189 Chronic kidney disease, unspecified: Secondary | ICD-10-CM | POA: Diagnosis not present

## 2024-04-09 DIAGNOSIS — S81811D Laceration without foreign body, right lower leg, subsequent encounter: Secondary | ICD-10-CM | POA: Diagnosis not present

## 2024-04-09 DIAGNOSIS — K219 Gastro-esophageal reflux disease without esophagitis: Secondary | ICD-10-CM | POA: Diagnosis not present

## 2024-04-09 DIAGNOSIS — M8580 Other specified disorders of bone density and structure, unspecified site: Secondary | ICD-10-CM | POA: Diagnosis not present

## 2024-04-09 DIAGNOSIS — Z556 Problems related to health literacy: Secondary | ICD-10-CM | POA: Diagnosis not present

## 2024-04-09 DIAGNOSIS — D631 Anemia in chronic kidney disease: Secondary | ICD-10-CM | POA: Diagnosis not present

## 2024-04-09 DIAGNOSIS — E559 Vitamin D deficiency, unspecified: Secondary | ICD-10-CM | POA: Diagnosis not present

## 2024-04-09 DIAGNOSIS — F419 Anxiety disorder, unspecified: Secondary | ICD-10-CM | POA: Diagnosis not present

## 2024-04-09 DIAGNOSIS — F32A Depression, unspecified: Secondary | ICD-10-CM | POA: Diagnosis not present

## 2024-04-09 DIAGNOSIS — N390 Urinary tract infection, site not specified: Secondary | ICD-10-CM | POA: Diagnosis not present

## 2024-04-09 DIAGNOSIS — I509 Heart failure, unspecified: Secondary | ICD-10-CM | POA: Diagnosis not present

## 2024-04-09 DIAGNOSIS — I130 Hypertensive heart and chronic kidney disease with heart failure and stage 1 through stage 4 chronic kidney disease, or unspecified chronic kidney disease: Secondary | ICD-10-CM | POA: Diagnosis not present

## 2024-04-11 DIAGNOSIS — I509 Heart failure, unspecified: Secondary | ICD-10-CM | POA: Diagnosis not present

## 2024-04-11 DIAGNOSIS — S81811D Laceration without foreign body, right lower leg, subsequent encounter: Secondary | ICD-10-CM | POA: Diagnosis not present

## 2024-04-11 DIAGNOSIS — I130 Hypertensive heart and chronic kidney disease with heart failure and stage 1 through stage 4 chronic kidney disease, or unspecified chronic kidney disease: Secondary | ICD-10-CM | POA: Diagnosis not present

## 2024-04-17 DIAGNOSIS — M79674 Pain in right toe(s): Secondary | ICD-10-CM | POA: Diagnosis not present

## 2024-04-17 DIAGNOSIS — M79675 Pain in left toe(s): Secondary | ICD-10-CM | POA: Diagnosis not present

## 2024-04-17 DIAGNOSIS — B351 Tinea unguium: Secondary | ICD-10-CM | POA: Diagnosis not present

## 2024-04-28 ENCOUNTER — Inpatient Hospital Stay (HOSPITAL_COMMUNITY)
Admission: EM | Admit: 2024-04-28 | Discharge: 2024-05-08 | DRG: 177 | Disposition: A | Discharge status: Skilled Nursing Facility | Source: Skilled Nursing Facility | Attending: Family Medicine | Admitting: Family Medicine

## 2024-04-28 ENCOUNTER — Encounter (HOSPITAL_COMMUNITY): Payer: Self-pay

## 2024-04-28 ENCOUNTER — Emergency Department (HOSPITAL_COMMUNITY)

## 2024-04-28 ENCOUNTER — Other Ambulatory Visit: Payer: Self-pay

## 2024-04-28 DIAGNOSIS — Z751 Person awaiting admission to adequate facility elsewhere: Secondary | ICD-10-CM

## 2024-04-28 DIAGNOSIS — W06XXXA Fall from bed, initial encounter: Secondary | ICD-10-CM | POA: Diagnosis present

## 2024-04-28 DIAGNOSIS — Z79899 Other long term (current) drug therapy: Secondary | ICD-10-CM

## 2024-04-28 DIAGNOSIS — E860 Dehydration: Secondary | ICD-10-CM | POA: Diagnosis present

## 2024-04-28 DIAGNOSIS — F32A Depression, unspecified: Secondary | ICD-10-CM | POA: Diagnosis present

## 2024-04-28 DIAGNOSIS — E785 Hyperlipidemia, unspecified: Secondary | ICD-10-CM | POA: Diagnosis present

## 2024-04-28 DIAGNOSIS — R54 Age-related physical debility: Secondary | ICD-10-CM | POA: Diagnosis present

## 2024-04-28 DIAGNOSIS — E43 Unspecified severe protein-calorie malnutrition: Secondary | ICD-10-CM | POA: Diagnosis present

## 2024-04-28 DIAGNOSIS — Z825 Family history of asthma and other chronic lower respiratory diseases: Secondary | ICD-10-CM

## 2024-04-28 DIAGNOSIS — E876 Hypokalemia: Secondary | ICD-10-CM | POA: Diagnosis present

## 2024-04-28 DIAGNOSIS — J189 Pneumonia, unspecified organism: Principal | ICD-10-CM | POA: Diagnosis present

## 2024-04-28 DIAGNOSIS — Z6821 Body mass index (BMI) 21.0-21.9, adult: Secondary | ICD-10-CM

## 2024-04-28 DIAGNOSIS — Z1152 Encounter for screening for COVID-19: Secondary | ICD-10-CM

## 2024-04-28 DIAGNOSIS — N1831 Chronic kidney disease, stage 3a: Secondary | ICD-10-CM | POA: Diagnosis present

## 2024-04-28 DIAGNOSIS — L89151 Pressure ulcer of sacral region, stage 1: Secondary | ICD-10-CM | POA: Diagnosis present

## 2024-04-28 DIAGNOSIS — E039 Hypothyroidism, unspecified: Secondary | ICD-10-CM | POA: Diagnosis present

## 2024-04-28 DIAGNOSIS — H9190 Unspecified hearing loss, unspecified ear: Secondary | ICD-10-CM | POA: Diagnosis present

## 2024-04-28 DIAGNOSIS — Z66 Do not resuscitate: Secondary | ICD-10-CM | POA: Diagnosis present

## 2024-04-28 DIAGNOSIS — L8990 Pressure ulcer of unspecified site, unspecified stage: Secondary | ICD-10-CM | POA: Insufficient documentation

## 2024-04-28 DIAGNOSIS — N179 Acute kidney failure, unspecified: Secondary | ICD-10-CM | POA: Diagnosis present

## 2024-04-28 DIAGNOSIS — S0083XA Contusion of other part of head, initial encounter: Secondary | ICD-10-CM | POA: Diagnosis present

## 2024-04-28 DIAGNOSIS — I5023 Acute on chronic systolic (congestive) heart failure: Secondary | ICD-10-CM | POA: Diagnosis present

## 2024-04-28 DIAGNOSIS — R1310 Dysphagia, unspecified: Secondary | ICD-10-CM | POA: Diagnosis present

## 2024-04-28 DIAGNOSIS — I130 Hypertensive heart and chronic kidney disease with heart failure and stage 1 through stage 4 chronic kidney disease, or unspecified chronic kidney disease: Secondary | ICD-10-CM | POA: Diagnosis present

## 2024-04-28 DIAGNOSIS — J690 Pneumonitis due to inhalation of food and vomit: Principal | ICD-10-CM | POA: Diagnosis present

## 2024-04-28 DIAGNOSIS — J9601 Acute respiratory failure with hypoxia: Secondary | ICD-10-CM | POA: Diagnosis present

## 2024-04-28 DIAGNOSIS — M810 Age-related osteoporosis without current pathological fracture: Secondary | ICD-10-CM | POA: Diagnosis present

## 2024-04-28 NOTE — ED Triage Notes (Addendum)
 Pt BIB [facility] from [place]. Pt presents with injuries from a fall. Pt rolled out of bed and has some bruising on her forehead. EMS also report pt has had a new cough for the last few days. Pt is not on anticoagulation. Pt A/Ox4 on arrival.   EMS Vitals  170/90

## 2024-04-29 DIAGNOSIS — I130 Hypertensive heart and chronic kidney disease with heart failure and stage 1 through stage 4 chronic kidney disease, or unspecified chronic kidney disease: Secondary | ICD-10-CM | POA: Diagnosis present

## 2024-04-29 DIAGNOSIS — Z825 Family history of asthma and other chronic lower respiratory diseases: Secondary | ICD-10-CM | POA: Diagnosis not present

## 2024-04-29 DIAGNOSIS — J189 Pneumonia, unspecified organism: Secondary | ICD-10-CM | POA: Diagnosis present

## 2024-04-29 DIAGNOSIS — E876 Hypokalemia: Secondary | ICD-10-CM | POA: Diagnosis present

## 2024-04-29 DIAGNOSIS — L89151 Pressure ulcer of sacral region, stage 1: Secondary | ICD-10-CM | POA: Diagnosis present

## 2024-04-29 DIAGNOSIS — F32A Depression, unspecified: Secondary | ICD-10-CM | POA: Diagnosis present

## 2024-04-29 DIAGNOSIS — Z79899 Other long term (current) drug therapy: Secondary | ICD-10-CM | POA: Diagnosis not present

## 2024-04-29 DIAGNOSIS — H9190 Unspecified hearing loss, unspecified ear: Secondary | ICD-10-CM | POA: Diagnosis present

## 2024-04-29 DIAGNOSIS — Z751 Person awaiting admission to adequate facility elsewhere: Secondary | ICD-10-CM | POA: Diagnosis not present

## 2024-04-29 DIAGNOSIS — J690 Pneumonitis due to inhalation of food and vomit: Secondary | ICD-10-CM | POA: Diagnosis present

## 2024-04-29 DIAGNOSIS — M810 Age-related osteoporosis without current pathological fracture: Secondary | ICD-10-CM | POA: Diagnosis present

## 2024-04-29 DIAGNOSIS — E039 Hypothyroidism, unspecified: Secondary | ICD-10-CM | POA: Diagnosis present

## 2024-04-29 DIAGNOSIS — E43 Unspecified severe protein-calorie malnutrition: Secondary | ICD-10-CM | POA: Diagnosis present

## 2024-04-29 DIAGNOSIS — Z1152 Encounter for screening for COVID-19: Secondary | ICD-10-CM | POA: Diagnosis not present

## 2024-04-29 DIAGNOSIS — E860 Dehydration: Secondary | ICD-10-CM | POA: Diagnosis present

## 2024-04-29 DIAGNOSIS — W06XXXA Fall from bed, initial encounter: Secondary | ICD-10-CM | POA: Diagnosis present

## 2024-04-29 DIAGNOSIS — N189 Chronic kidney disease, unspecified: Secondary | ICD-10-CM | POA: Diagnosis not present

## 2024-04-29 DIAGNOSIS — R54 Age-related physical debility: Secondary | ICD-10-CM | POA: Diagnosis present

## 2024-04-29 DIAGNOSIS — E785 Hyperlipidemia, unspecified: Secondary | ICD-10-CM | POA: Diagnosis present

## 2024-04-29 DIAGNOSIS — S0083XA Contusion of other part of head, initial encounter: Secondary | ICD-10-CM | POA: Diagnosis present

## 2024-04-29 DIAGNOSIS — N1831 Chronic kidney disease, stage 3a: Secondary | ICD-10-CM | POA: Diagnosis present

## 2024-04-29 DIAGNOSIS — J9601 Acute respiratory failure with hypoxia: Secondary | ICD-10-CM | POA: Diagnosis present

## 2024-04-29 DIAGNOSIS — Z66 Do not resuscitate: Secondary | ICD-10-CM | POA: Diagnosis present

## 2024-04-29 DIAGNOSIS — I5023 Acute on chronic systolic (congestive) heart failure: Secondary | ICD-10-CM | POA: Diagnosis present

## 2024-04-29 DIAGNOSIS — R1310 Dysphagia, unspecified: Secondary | ICD-10-CM | POA: Diagnosis present

## 2024-04-29 DIAGNOSIS — N179 Acute kidney failure, unspecified: Secondary | ICD-10-CM | POA: Diagnosis present

## 2024-04-29 LAB — CBC WITH DIFFERENTIAL/PLATELET
Abs Immature Granulocytes: 0.34 K/uL — ABNORMAL HIGH (ref 0.00–0.07)
Basophils Absolute: 0 K/uL (ref 0.0–0.1)
Basophils Relative: 0 %
Eosinophils Absolute: 0.3 K/uL (ref 0.0–0.5)
Eosinophils Relative: 1 %
HCT: 33.4 % — ABNORMAL LOW (ref 36.0–46.0)
Hemoglobin: 10.8 g/dL — ABNORMAL LOW (ref 12.0–15.0)
Immature Granulocytes: 2 %
Lymphocytes Relative: 6 %
Lymphs Abs: 1.2 K/uL (ref 0.7–4.0)
MCH: 31.6 pg (ref 26.0–34.0)
MCHC: 32.3 g/dL (ref 30.0–36.0)
MCV: 97.7 fL (ref 80.0–100.0)
Monocytes Absolute: 1.1 K/uL — ABNORMAL HIGH (ref 0.1–1.0)
Monocytes Relative: 5 %
Neutro Abs: 19.3 K/uL — ABNORMAL HIGH (ref 1.7–7.7)
Neutrophils Relative %: 86 %
Platelets: 247 K/uL (ref 150–400)
RBC: 3.42 MIL/uL — ABNORMAL LOW (ref 3.87–5.11)
RDW: 14 % (ref 11.5–15.5)
WBC: 22.2 K/uL — ABNORMAL HIGH (ref 4.0–10.5)
nRBC: 0 % (ref 0.0–0.2)

## 2024-04-29 LAB — COMPREHENSIVE METABOLIC PANEL WITH GFR
ALT: 27 U/L (ref 0–44)
AST: 40 U/L (ref 15–41)
Albumin: 3.1 g/dL — ABNORMAL LOW (ref 3.5–5.0)
Alkaline Phosphatase: 143 U/L — ABNORMAL HIGH (ref 38–126)
Anion gap: 9 (ref 5–15)
BUN: 29 mg/dL — ABNORMAL HIGH (ref 8–23)
CO2: 28 mmol/L (ref 22–32)
Calcium: 9.1 mg/dL (ref 8.9–10.3)
Chloride: 96 mmol/L — ABNORMAL LOW (ref 98–111)
Creatinine, Ser: 1.46 mg/dL — ABNORMAL HIGH (ref 0.44–1.00)
GFR, Estimated: 32 mL/min — ABNORMAL LOW
Glucose, Bld: 122 mg/dL — ABNORMAL HIGH (ref 70–99)
Potassium: 3.8 mmol/L (ref 3.5–5.1)
Sodium: 133 mmol/L — ABNORMAL LOW (ref 135–145)
Total Bilirubin: 0.5 mg/dL (ref 0.0–1.2)
Total Protein: 7 g/dL (ref 6.5–8.1)

## 2024-04-29 LAB — URINALYSIS, W/ REFLEX TO CULTURE (INFECTION SUSPECTED)
Bilirubin Urine: NEGATIVE
Glucose, UA: NEGATIVE mg/dL
Hgb urine dipstick: NEGATIVE
Ketones, ur: NEGATIVE mg/dL
Nitrite: NEGATIVE
Protein, ur: NEGATIVE mg/dL
Specific Gravity, Urine: 1.01 (ref 1.005–1.030)
pH: 5 (ref 5.0–8.0)

## 2024-04-29 LAB — RESP PANEL BY RT-PCR (RSV, FLU A&B, COVID)  RVPGX2
Influenza A by PCR: NEGATIVE
Influenza B by PCR: NEGATIVE
Resp Syncytial Virus by PCR: NEGATIVE
SARS Coronavirus 2 by RT PCR: NEGATIVE

## 2024-04-29 LAB — CK: Total CK: 39 U/L (ref 38–234)

## 2024-04-29 LAB — TSH: TSH: 2.54 u[IU]/mL (ref 0.350–4.500)

## 2024-04-29 LAB — PRO BRAIN NATRIURETIC PEPTIDE: Pro Brain Natriuretic Peptide: 33008 pg/mL — ABNORMAL HIGH

## 2024-04-29 MED ORDER — FERROUS SULFATE 325 (65 FE) MG PO TABS
325.0000 mg | ORAL_TABLET | Freq: Every day | ORAL | Status: DC
  Administered 2024-04-29 – 2024-05-08 (×10): 325 mg via ORAL
  Filled 2024-04-29 (×3): qty 1

## 2024-04-29 MED ORDER — IPRATROPIUM-ALBUTEROL 0.5-2.5 (3) MG/3ML IN SOLN
3.0000 mL | RESPIRATORY_TRACT | Status: AC
  Administered 2024-04-29: 3 mL via RESPIRATORY_TRACT
  Filled 2024-04-29: qty 3

## 2024-04-29 MED ORDER — FOLIC ACID 1 MG PO TABS
1.0000 mg | ORAL_TABLET | Freq: Every day | ORAL | Status: DC
  Administered 2024-04-29 – 2024-05-08 (×10): 1 mg via ORAL
  Filled 2024-04-29 (×3): qty 1

## 2024-04-29 MED ORDER — FUROSEMIDE 20 MG PO TABS
20.0000 mg | ORAL_TABLET | Freq: Every day | ORAL | Status: DC
  Administered 2024-04-29: 20 mg via ORAL
  Filled 2024-04-29: qty 1

## 2024-04-29 MED ORDER — AMLODIPINE BESYLATE 5 MG PO TABS
5.0000 mg | ORAL_TABLET | Freq: Every day | ORAL | Status: DC
  Administered 2024-04-29 – 2024-05-08 (×9): 5 mg via ORAL
  Filled 2024-04-29 (×3): qty 1

## 2024-04-29 MED ORDER — SODIUM CHLORIDE 0.9 % IV SOLN
500.0000 mg | INTRAVENOUS | Status: DC
  Administered 2024-04-29 – 2024-05-02 (×5): 500 mg via INTRAVENOUS
  Filled 2024-04-29: qty 5

## 2024-04-29 MED ORDER — SENNOSIDES-DOCUSATE SODIUM 8.6-50 MG PO TABS
1.0000 | ORAL_TABLET | Freq: Two times a day (BID) | ORAL | Status: DC
  Administered 2024-04-29 – 2024-05-08 (×19): 1 via ORAL
  Filled 2024-04-29 (×5): qty 1

## 2024-04-29 MED ORDER — FUROSEMIDE 10 MG/ML IJ SOLN
40.0000 mg | Freq: Once | INTRAMUSCULAR | Status: AC
  Administered 2024-04-29: 40 mg via INTRAVENOUS
  Filled 2024-04-29: qty 4

## 2024-04-29 MED ORDER — SERTRALINE HCL 50 MG PO TABS
100.0000 mg | ORAL_TABLET | Freq: Every day | ORAL | Status: DC
  Administered 2024-04-29 – 2024-05-08 (×10): 100 mg via ORAL
  Filled 2024-04-29 (×3): qty 2

## 2024-04-29 MED ORDER — VITAMIN D 25 MCG (1000 UNIT) PO TABS
2000.0000 [IU] | ORAL_TABLET | Freq: Every day | ORAL | Status: DC
  Administered 2024-04-29 – 2024-05-08 (×10): 2000 [IU] via ORAL
  Filled 2024-04-29 (×3): qty 2

## 2024-04-29 MED ORDER — SODIUM CHLORIDE 0.9 % IV SOLN
3.0000 g | Freq: Two times a day (BID) | INTRAVENOUS | Status: DC
  Administered 2024-04-29 – 2024-05-03 (×9): 3 g via INTRAVENOUS
  Filled 2024-04-29 (×2): qty 8

## 2024-04-29 MED ORDER — FAMOTIDINE 20 MG PO TABS
20.0000 mg | ORAL_TABLET | Freq: Every day | ORAL | Status: DC
  Administered 2024-04-29 – 2024-05-08 (×10): 20 mg via ORAL
  Filled 2024-04-29 (×3): qty 1

## 2024-04-29 MED ORDER — SODIUM CHLORIDE 0.9 % IV BOLUS
1000.0000 mL | Freq: Once | INTRAVENOUS | Status: AC
  Administered 2024-04-29: 1000 mL via INTRAVENOUS

## 2024-04-29 MED ORDER — POLYETHYLENE GLYCOL 3350 17 G PO PACK
17.0000 g | PACK | Freq: Every day | ORAL | Status: DC
  Administered 2024-04-30 – 2024-05-08 (×8): 17 g via ORAL
  Filled 2024-04-29 (×2): qty 1

## 2024-04-29 MED ORDER — VITAMIN B-12 1000 MCG PO TABS
1000.0000 ug | ORAL_TABLET | Freq: Every day | ORAL | Status: DC
  Administered 2024-04-29 – 2024-05-08 (×10): 1000 ug via ORAL
  Filled 2024-04-29 (×3): qty 1

## 2024-04-29 MED ORDER — SODIUM CHLORIDE 0.9 % IV SOLN
2.0000 g | Freq: Once | INTRAVENOUS | Status: AC
  Administered 2024-04-29: 2 g via INTRAVENOUS
  Filled 2024-04-29: qty 20

## 2024-04-29 NOTE — Plan of Care (Signed)
   Problem: Clinical Measurements: Goal: Will remain free from infection Outcome: Progressing   Problem: Clinical Measurements: Goal: Diagnostic test results will improve Outcome: Progressing   Problem: Activity: Goal: Risk for activity intolerance will decrease Outcome: Progressing   Problem: Nutrition: Goal: Adequate nutrition will be maintained Outcome: Progressing

## 2024-04-29 NOTE — H&P (Addendum)
 " History and Physical    Patient: Catherine West FMW:[id number] DOB: [birth year] DOA: 04/28/2024 DOS: the patient was seen and examined on 04/29/2024 PCP: Shona Norleen PEDLAR, MD  Patient coming from: SNF  Chief Complaint:  Chief Complaint  Patient presents with   Fall   HPI: Catherine West is a 89 y.o. female with medical history significant of HFrEF (echo 09/2022 EF 35-40%, poss PFO)hyperlipidemia, hypertension, CKD 3, hypothyroidism, depression,. [name] brought to ED after rolling out of bed and sustaining head trauma.  While in ED patient noted to have cough, elevated white count, negative RSV, covid flu. She was afebrile. While sleeping, sats noted to decrease in the 80s on room air.   Chest xray suspicious for RML pneumonia UA rare bacteria white cells, no nitrates. Cratinine at baseline 1.4 Sodium 133 Alkphos 143 She was given 1L ns rocephin  and azithromycin  IV Head trauma - head and spine CT negative for acute findings   Review of Systems: As mentioned in the history of present illness. All other systems reviewed and are negative. Past Medical History:  Diagnosis Date   Acute bronchitis    Acute bronchitis    Chronic kidney disease, stage 2 (mild)    Chronic kidney disease, unspecified    Cough    Essential (primary) hypertension    HTN (hypertension)    Hyperlipidemia, unspecified    Hypertensive chronic kidney disease with stage 1 through stage 4 chronic kidney disease, or unspecified chronic kidney disease    Hypothyroidism, unspecified    Major depressive disorder, single episode, unspecified    Osteoporosis    Osteoporosis    Pneumonia    Pneumonia    Urinary tract infection    Urinary tract infection    Past Surgical History:  Procedure Laterality Date   LESION REMOVAL Left 11/10/2022   Procedure: LESION REMOVAL TRUNK;  Surgeon: Mavis Anes, MD;  Location: [facility];  Service: General;  Laterality: Left;  left lower trunk   Social History:  reports that she  has never smoked. She has never used smokeless tobacco. She reports that she does not drink alcohol and does not use drugs.  Allergies[1]  Family History  Problem Relation Age of Onset   Other Father        brain tumor, inoperable   Emphysema Sister        smoker   Stroke Neg Hx    Parkinson's disease Neg Hx    Dementia Neg Hx     Prior to Admission medications  Medication Sig Start Date End Date Taking? Authorizing Provider  acetaminophen  (TYLENOL ) 650 MG CR tablet Take 650 mg by mouth every 8 (eight) hours as needed for pain.    [provider]  albuterol  (PROVENTIL ) (2.5 MG/3ML) 0.083% nebulizer solution Take 3 mLs (2.5 mg total) by nebulization every 4 (four) hours as needed for wheezing or shortness of breath. 01/22/23 01/22/24  Pearlean Manus, MD  albuterol  (VENTOLIN  HFA) 108 (90 Base) MCG/ACT inhaler Inhale 2 puffs into the lungs every 4 (four) hours as needed for wheezing or shortness of breath. 01/22/23   Pearlean, Courage, MD  amLODipine  (NORVASC ) 10 MG tablet Take 0.5 tablets (5 mg total) by mouth daily. 01/22/23   Pearlean Manus, MD  Cholecalciferol  (VITAMIN D ) 50 MCG (2000 UT) CAPS Take 2,000 Units by mouth daily.    [provider]  cyanocobalamin  (VITAMIN B12) 1000 MCG tablet Take 1 tablet (1,000 mcg total) by mouth daily. 01/22/23   Pearlean Manus, MD  famotidine  (PEPCID ) 20 MG tablet Take 1 tablet (20 mg total) by mouth daily. 12/06/22   Krishnan, Gokul, MD  ferrous sulfate  325 (65 FE) MG EC tablet Take 1 tablet (325 mg total) by mouth daily with breakfast. 01/22/23   Pearlean Manus, MD  folic acid  (FOLVITE ) 1 MG tablet Take 1 tablet (1 mg total) by mouth daily. 12/06/22   Krishnan, Gokul, MD  furosemide  (LASIX ) 20 MG tablet Take 1 tablet (20 mg total) by mouth daily. 01/22/23 01/22/24  Pearlean Manus, MD  loperamide (IMODIUM A-D) 2 MG tablet Take 2 mg by mouth every 6 (six) hours as needed for diarrhea or loose stools.    [provider]  losartan   (COZAAR ) 50 MG tablet Take 50 mg by mouth daily. 01/18/23   [provider]  polyethylene glycol (MIRALAX  / GLYCOLAX ) 17 g packet Take 17 g by mouth daily. 12/06/22   Krishnan, Gokul, MD  senna-docusate (SENOKOT-S) 8.6-50 MG tablet Take 2 tablets by mouth 2 (two) times daily. Patient taking differently: Take 1 tablet by mouth 2 (two) times daily. 12/06/22   Krishnan, Gokul, MD  sertraline  (ZOLOFT ) 100 MG tablet Take 100 mg by mouth daily.    [provider]    Physical Exam: Vitals:   04/28/24 2213 04/28/24 2220 04/29/24 0100 04/29/24 0200  BP:  (!) 148/65 (!) 154/66 (!) 147/63  Pulse:  71 72 73  Resp:  18 19 18   Temp:  (!) 97.5 F (36.4 C)  97.6 F (36.4 C)  TempSrc:  Temporal  Oral  SpO2:  93% 92% 95%  Weight: 49.9 kg     Height: 5' 2 (1.575 m)      GENERAL- sleeping, slightly hard of hearing, appears as stated age, not in any distress. HEENT- Scalp trauma left frontal forehead, normocephalic, PERRL, EOMI, oral mucosa appears dry CARDIAC- RRR, grade II systolic murrnur  murmurs, rubs or gallops. RESP- Moving equal volumes of air, and clear to auscultation bilaterally, no wheezes or crackles. ABDOMEN- Soft, nontender, bowel sounds present. NEURO- No obvious Cr N abnormality. EXTREMITIES- pulse 2+, symmetric, no pedal edema. SKIN- Warm, dry, few skin tears bruising BLE PSYCH- Normal mood and affect, appropriate thought content and speech.  Data Reviewed: There are no new results to review at this time.  Assessment and Plan: No notes have been filed under this hospital service. Service: Hospitalist  Acute respiratory with hypoxia -oxygen  sats 86-88% while sleeping - supplemental oxygen  via [place] keep sats above 92%, wean as tolerated Unasyn  - dosing per pharmacy - suspect aspiration  RML pneumonia As abov  HFrEF Resume daily lasix  Duonebs every 4h  Bnp - questionable fluid on exam but H Resume arb   Hypertension Resume arb and norvasc  when able   CKD  III Monitor renal function  Hypothyroidism Thyroid  supplementation not current on meds, check TSH  Hyperlipidemia Resume statin if CK is good  Depression Continue zoloft   Advance Care Planning:   Code Status: Limited: Do not attempt resuscitation (DNR) -DNR-LIMITED -Do Not Intubate/DNI  golden rod at bedside  Consults: na  Family Communication: none at bedside  Severity of Illness: The appropriate patient status for this patient is INPATIENT. Inpatient status is judged to be reasonable and necessary in order to provide the required intensity of service to ensure the patient's safety. The patient's presenting symptoms, physical exam findings, and initial radiographic and laboratory data in the context of their chronic comorbidities is felt to place them at high risk for further clinical deterioration. Furthermore, it is not anticipated that the patient will be medically stable for discharge from the hospital within 2 midnights of admission.   * I certify that at the point of admission it is my clinical judgment that the patient will require inpatient hospital care spanning beyond 2 midnights from the point of admission due to high intensity of service, high risk for further deterioration and high frequency of surveillance required.*  Author: Erminio Cone, NP 04/29/2024 3:48 AM  For on call review [email or web address].      [1] No Known Allergies  "

## 2024-04-29 NOTE — TOC Initial Note (Signed)
 Transition of Care [facility]) - Initial/Assessment Note    Patient Details  Name: Catherine West MRN: [id number] Date of Birth: 1920-08-08  [facility]) CM/SW Contact:    Lucie Lunger, LCSWA Phone Number: 04/29/2024, 3:27 PM  Clinical Narrative:                 CSW notes per chart review that pt arrived from [facility]. CSW spoke with pts daughter Catherine West to complete assessment. Pt is a resident at [facility]. Pt has assistance with completing her ADLs. Pt uses a wheelchair at the facility, daughter states she no longer walks. Pt has had HH in the past but not currently. TOC to follow.   Expected Discharge Plan: Assisted Living Barriers to Discharge: Continued Medical Work up   Patient Goals and CMS Choice Patient states their goals for this hospitalization and ongoing recovery are:: return to ALF CMS Medicare.gov Compare Post Acute Care list provided to:: Patient Represenative (must comment) Choice offered to / list presented to : Patient, Adult Children      Expected Discharge Plan and Services In-house Referral: Clinical Social Work Discharge Planning Services: CM Consult Post Acute Care Choice: Resumption of Svcs/PTA Provider Living arrangements for the past 2 months: Assisted Living Facility                                      Prior Living Arrangements/Services Living arrangements for the past 2 months: Assisted Living Facility Lives with:: Facility Resident Patient language and need for interpreter reviewed:: Yes Do you feel safe going back to the place where you live?: Yes      Need for Family Participation in Patient Care: Yes (Comment) Care giver support system in place?: Yes (comment) Current home services: DME Criminal Activity/Legal Involvement Pertinent to Current Situation/Hospitalization: No - Comment as needed  Activities of Daily Living   ADL Screening (condition at time of admission) Independently performs ADLs?: Yes (appropriate  for developmental age) Is the patient deaf or have difficulty hearing?: No Does the patient have difficulty seeing, even when wearing glasses/contacts?: No Does the patient have difficulty concentrating, remembering, or making decisions?: Yes  Permission Sought/Granted                  Emotional Assessment Appearance:: Appears stated age       Alcohol / Substance Use: Not Applicable Psych Involvement: No (comment)  Admission diagnosis:  Pneumonia [J18.9] Community acquired pneumonia, unspecified laterality [J18.9] Patient Active Problem List   Diagnosis Date Noted   Pneumonia 04/29/2024   AKI (acute kidney injury) 01/21/2023   Acute respiratory failure with hypoxia (HCC) 01/20/2023   Acute renal failure superimposed on stage 3b chronic kidney disease (HCC) 01/20/2023   Right lower lobe pneumonia 12/09/2022   Pubic ramus fracture, left, sequela 12/08/2022   Benign hypertension with stage 3b chronic kidney disease (HCC) 12/08/2022   Chronic constipation 12/08/2022   Macrocytic anemia 12/08/2022   Folate deficiency 12/08/2022   Vitamin B 12 deficiency 12/08/2022   GERD without esophagitis 12/08/2022   Aortic atherosclerosis 12/08/2022   Chronic HFrEF (heart failure with reduced ejection fraction) (HCC) 12/08/2022   Stage 3b chronic kidney disease (HCC) 12/08/2022   Compression fracture of L1 lumbar vertebra (HCC) 12/03/2022   Leukocytosis 12/03/2022   Basal cell carcinoma (BCC) of abdomen 11/10/2022   Acute CHF (HCC) 09/04/2022   Essential (primary) hypertension    Hyperlipidemia,  unspecified    Major depressive disorder, single episode, unspecified    Hypothyroidism, unspecified    Osteoporosis    Gait abnormality 02/22/2019   Falls frequently 02/22/2019   PCP:  Shona Norleen PEDLAR, MD Pharmacy:   [facility] - [place], [place] - [address] [place] [address] Phone: [phone] Fax: [phone]  [facility] - [place],  [place] - [phone] [address] [place] [address] Phone: [phone] Fax: [phone]     Social Drivers of Health (SDOH) Social History: SDOH Screenings   Food Insecurity: Patient Unable To Answer (04/29/2024)  Housing: Patient Unable To Answer (04/29/2024)  Transportation Needs: Patient Unable To Answer (04/29/2024)  Utilities: Patient Unable To Answer (04/29/2024)  Social Connections: Patient Unable To Answer (04/29/2024)  Tobacco Use: Low Risk (04/28/2024)   SDOH Interventions:     Readmission Risk Interventions     No data to display

## 2024-04-29 NOTE — ED Provider Notes (Signed)
 " [place] EMERGENCY DEPARTMENT AT [facility] Provider Note   CSN: [id number] Arrival date & time: 04/28/24  2204     Patient presents with: Catherine West is a 89 y.o. female.   Patient reportedly rolled out of bed and landed on her head causing hematoma to her right forehead.  Patient denies any other injuries.  States she does not have any pain anywhere else.  She states she has had a cough for at least a few days now that has not been checked out.  She denies fever or productive cough.   Fall       Prior to Admission medications  Medication Sig Start Date End Date Taking? Authorizing Provider  acetaminophen  (TYLENOL ) 650 MG CR tablet Take 650 mg by mouth every 8 (eight) hours as needed for pain.    [provider]  albuterol  (PROVENTIL ) (2.5 MG/3ML) 0.083% nebulizer solution Take 3 mLs (2.5 mg total) by nebulization every 4 (four) hours as needed for wheezing or shortness of breath. 01/22/23 01/22/24  Pearlean Manus, MD  albuterol  (VENTOLIN  HFA) 108 (90 Base) MCG/ACT inhaler Inhale 2 puffs into the lungs every 4 (four) hours as needed for wheezing or shortness of breath. 01/22/23   Pearlean, Courage, MD  amLODipine  (NORVASC ) 10 MG tablet Take 0.5 tablets (5 mg total) by mouth daily. 01/22/23   Pearlean Manus, MD  Cholecalciferol  (VITAMIN D ) 50 MCG (2000 UT) CAPS Take 2,000 Units by mouth daily.    [provider]  cyanocobalamin  (VITAMIN B12) 1000 MCG tablet Take 1 tablet (1,000 mcg total) by mouth daily. 01/22/23   Pearlean Manus, MD  famotidine  (PEPCID ) 20 MG tablet Take 1 tablet (20 mg total) by mouth daily. 12/06/22   Krishnan, Gokul, MD  ferrous sulfate  325 (65 FE) MG EC tablet Take 1 tablet (325 mg total) by mouth daily with breakfast. 01/22/23   Pearlean Manus, MD  folic acid  (FOLVITE ) 1 MG tablet Take 1 tablet (1 mg total) by mouth daily. 12/06/22   Krishnan, Gokul, MD  furosemide  (LASIX ) 20 MG tablet Take 1 tablet (20 mg total) by  mouth daily. 01/22/23 01/22/24  Pearlean Manus, MD  loperamide (IMODIUM A-D) 2 MG tablet Take 2 mg by mouth every 6 (six) hours as needed for diarrhea or loose stools.    [provider]  losartan  (COZAAR ) 50 MG tablet Take 50 mg by mouth daily. 01/18/23   [provider]  polyethylene glycol (MIRALAX  / GLYCOLAX ) 17 g packet Take 17 g by mouth daily. 12/06/22   Krishnan, Gokul, MD  senna-docusate (SENOKOT-S) 8.6-50 MG tablet Take 2 tablets by mouth 2 (two) times daily. Patient taking differently: Take 1 tablet by mouth 2 (two) times daily. 12/06/22   Krishnan, Gokul, MD  sertraline  (ZOLOFT ) 100 MG tablet Take 100 mg by mouth daily.    [provider]    Allergies: Patient has no known allergies.    Review of Systems  Updated Vital Signs BP (!) 147/63   Pulse 73   Temp 97.6 F (36.4 C) (Oral)   Resp 18   Ht 5' 2 (1.575 m)   Wt 49.9 kg   SpO2 95%   BMI 20.12 kg/m   Physical Exam Vitals and nursing note reviewed.  Constitutional:      Appearance: She is well-developed.  HENT:     Head: Normocephalic.     Comments: Moderate size right forehead hematoma Cardiovascular:     Rate and Rhythm: Normal rate  and regular rhythm.  Pulmonary:     Effort: No respiratory distress.     Breath sounds: No stridor.  Abdominal:     General: There is no distension.  Musculoskeletal:     Cervical back: Normal range of motion.     Comments: No cervical spine tenderness, thoracic spine tenderness or Lumbar spine tenderness.  No tenderness or pain with palpation and full ROM of all joints in upper and lower extremities.  No ecchymosis or other signs of trauma on back or extremities.  No Pain with AP or lateral compression of ribs.  No Paracervical ttp, paraspinal ttp   Neurological:     Mental Status: She is alert.     (all labs ordered are listed, but only abnormal results are displayed) Labs Reviewed  CBC WITH DIFFERENTIAL/PLATELET - Abnormal; Notable for the  following components:      Result Value   WBC 22.2 (*)    RBC 3.42 (*)    Hemoglobin 10.8 (*)    HCT 33.4 (*)    Neutro Abs 19.3 (*)    Monocytes Absolute 1.1 (*)    Abs Immature Granulocytes 0.34 (*)    All other components within normal limits  COMPREHENSIVE METABOLIC PANEL WITH GFR - Abnormal; Notable for the following components:   Sodium 133 (*)    Chloride 96 (*)    Glucose, Bld 122 (*)    BUN 29 (*)    Creatinine, Ser 1.46 (*)    Albumin 3.1 (*)    Alkaline Phosphatase 143 (*)    GFR, Estimated 32 (*)    All other components within normal limits  URINALYSIS, W/ REFLEX TO CULTURE (INFECTION SUSPECTED) - Abnormal; Notable for the following components:   APPearance HAZY (*)    Leukocytes,Ua MODERATE (*)    Bacteria, UA RARE (*)    All other components within normal limits  RESP PANEL BY RT-PCR (RSV, FLU A&B, COVID)  [id number]  CULTURE, BLOOD (ROUTINE X 2)  CULTURE, BLOOD (ROUTINE X 2)    EKG: None  Radiology: DG Chest 2 View Result Date: 04/29/2024 EXAM: 2 VIEW(S) XRAY OF THE CHEST 04/28/2024 11:43:27 PM COMPARISON: 10/29/2023 CLINICAL HISTORY: Eval for cough. FINDINGS: LUNGS AND PLEURA: Stable chronic interstitial markings. There are few nodular densities in the lateral right mid lung, slightly increased. No pleural effusion. No pneumothorax. HEART AND MEDIASTINUM: There are atherosclerotic calcifications of the aorta. No acute abnormality of the cardiac and mediastinal silhouettes. BONES AND SOFT TISSUES: Stable compression fracture in upper thoracic spine. IMPRESSION: 1. Patchy/nodular densities in the lateral right mid lung have slightly increased. These are indeterminate and may represent an infectious/inflammatory process, but a pulmonary nodule cannot be excluded. Recommend follow-up chest CT or short-term follow-up chest x-ray when acute symptoms resolve to reevaluate. Electronically signed by: Greig Pique MD 04/29/2024 12:30 AM EST RP Workstation: [id number]   CT Head  Wo Contrast Result Date: 04/28/2024 EXAM: CT HEAD AND CERVICAL SPINE 04/28/2024 10:47:34 PM TECHNIQUE: CT of the head and cervical spine was performed without the administration of intravenous contrast. Multiplanar reformatted images are provided for review. Automated exposure control, iterative reconstruction, and/or weight based adjustment of the mA/kV was utilized to reduce the radiation dose to as low as reasonably achievable. COMPARISON: None available. CLINICAL HISTORY: Head trauma, minor (Age >= 65y) FINDINGS: CT HEAD BRAIN AND VENTRICLES: No acute intracranial hemorrhage. No mass effect or midline shift. No abnormal extra-axial fluid collection. No evidence of acute infarct. No hydrocephalus. Patchy white matter hypodensities  are compatible with chronic microvascular ischemic disease. Cerebral atrophy. Calcific atherosclerosis. ORBITS: No acute abnormality. SINUSES AND MASTOIDS: No acute abnormality. SOFT TISSUES AND SKULL: No acute skull fracture. No acute soft tissue abnormality. CT CERVICAL SPINE BONES AND ALIGNMENT: No acute fracture or traumatic malalignment. Approximately 1-2 mm anterolisthesis of C4 on C5 and retrolisthesis of C5 on C6. DEGENERATIVE CHANGES: C5-C6 degenerative disc disease with disc height loss and endplate spurring. Facet and uncovertebral arthropathy with varying degrees of foraminal stenosis. SOFT TISSUES: No prevertebral soft tissue swelling. IMPRESSION: 1. No acute intracranial abnormality. 2. No acute fracture or traumatic malalignment of the cervical spine. Electronically signed by: Gilmore Molt 04/28/2024 11:21 PM EST RP Workstation: [id number]   CT Cervical Spine Wo Contrast Result Date: 04/28/2024 EXAM: CT HEAD AND CERVICAL SPINE 04/28/2024 10:47:34 PM TECHNIQUE: CT of the head and cervical spine was performed without the administration of intravenous contrast. Multiplanar reformatted images are provided for review. Automated exposure control, iterative  reconstruction, and/or weight based adjustment of the mA/kV was utilized to reduce the radiation dose to as low as reasonably achievable. COMPARISON: None available. CLINICAL HISTORY: Head trauma, minor (Age >= 65y) FINDINGS: CT HEAD BRAIN AND VENTRICLES: No acute intracranial hemorrhage. No mass effect or midline shift. No abnormal extra-axial fluid collection. No evidence of acute infarct. No hydrocephalus. Patchy white matter hypodensities are compatible with chronic microvascular ischemic disease. Cerebral atrophy. Calcific atherosclerosis. ORBITS: No acute abnormality. SINUSES AND MASTOIDS: No acute abnormality. SOFT TISSUES AND SKULL: No acute skull fracture. No acute soft tissue abnormality. CT CERVICAL SPINE BONES AND ALIGNMENT: No acute fracture or traumatic malalignment. Approximately 1-2 mm anterolisthesis of C4 on C5 and retrolisthesis of C5 on C6. DEGENERATIVE CHANGES: C5-C6 degenerative disc disease with disc height loss and endplate spurring. Facet and uncovertebral arthropathy with varying degrees of foraminal stenosis. SOFT TISSUES: No prevertebral soft tissue swelling. IMPRESSION: 1. No acute intracranial abnormality. 2. No acute fracture or traumatic malalignment of the cervical spine. Electronically signed by: Gilmore Molt 04/28/2024 11:21 PM EST RP Workstation: [id number]     Procedures   Medications Ordered in the ED  azithromycin  (ZITHROMAX ) 500 mg in sodium chloride  0.9 % 250 mL IVPB (0 mg Intravenous Stopped 04/29/24 0254)  cefTRIAXone  (ROCEPHIN ) 2 g in sodium chloride  0.9 % 100 mL IVPB (0 g Intravenous Stopped 04/29/24 0135)  sodium chloride  0.9 % bolus 1,000 mL (1,000 mLs Intravenous New Bag/Given 04/29/24 0152)                                    Medical Decision Making Amount and/or Complexity of Data Reviewed Labs: ordered. Radiology: ordered.  Risk Decision regarding hospitalization.  Will image head/neck from fall. Xr/cbc/cmp for cough, VS reassuring. Will  also check covid/flu/rsv. On my interpretation x-ray x-ray shows a left lower lobe pneumonia, radiology feels like it is more right middle lobe.  She has a significant leukocytosis and hypoxia when she is sleeping around 87 however still in the low 90s even when she is awake.  Secondary to her age, leukocytosis, pneumonia, distant cough will treat for commune acquired pneumonia and admit to hospitalist for further management.  Does not meet sepsis criteria at this time but blood cultures obtained prior to antibiotics.  Final diagnoses:  Community acquired pneumonia, unspecified laterality    ED Discharge Orders     None          Linnell Swords, Selinda, MD 04/29/24 [phone]  "

## 2024-04-30 DIAGNOSIS — I5023 Acute on chronic systolic (congestive) heart failure: Secondary | ICD-10-CM | POA: Diagnosis not present

## 2024-04-30 DIAGNOSIS — J690 Pneumonitis due to inhalation of food and vomit: Secondary | ICD-10-CM

## 2024-04-30 DIAGNOSIS — N179 Acute kidney failure, unspecified: Secondary | ICD-10-CM | POA: Diagnosis not present

## 2024-04-30 DIAGNOSIS — J9601 Acute respiratory failure with hypoxia: Secondary | ICD-10-CM | POA: Diagnosis not present

## 2024-04-30 DIAGNOSIS — N189 Chronic kidney disease, unspecified: Secondary | ICD-10-CM

## 2024-04-30 DIAGNOSIS — L8990 Pressure ulcer of unspecified site, unspecified stage: Secondary | ICD-10-CM | POA: Insufficient documentation

## 2024-04-30 LAB — CBC
HCT: 34.2 % — ABNORMAL LOW (ref 36.0–46.0)
Hemoglobin: 11 g/dL — ABNORMAL LOW (ref 12.0–15.0)
MCH: 31.3 pg (ref 26.0–34.0)
MCHC: 32.2 g/dL (ref 30.0–36.0)
MCV: 97.4 fL (ref 80.0–100.0)
Platelets: 268 K/uL (ref 150–400)
RBC: 3.51 MIL/uL — ABNORMAL LOW (ref 3.87–5.11)
RDW: 14 % (ref 11.5–15.5)
WBC: 15.9 K/uL — ABNORMAL HIGH (ref 4.0–10.5)
nRBC: 0 % (ref 0.0–0.2)

## 2024-04-30 LAB — BASIC METABOLIC PANEL WITH GFR
Anion gap: 11 (ref 5–15)
BUN: 21 mg/dL (ref 8–23)
CO2: 29 mmol/L (ref 22–32)
Calcium: 8.7 mg/dL — ABNORMAL LOW (ref 8.9–10.3)
Chloride: 100 mmol/L (ref 98–111)
Creatinine, Ser: 1.12 mg/dL — ABNORMAL HIGH (ref 0.44–1.00)
GFR, Estimated: 43 mL/min — ABNORMAL LOW
Glucose, Bld: 94 mg/dL (ref 70–99)
Potassium: 3.2 mmol/L — ABNORMAL LOW (ref 3.5–5.1)
Sodium: 140 mmol/L (ref 135–145)

## 2024-04-30 LAB — MAGNESIUM: Magnesium: 2.1 mg/dL (ref 1.7–2.4)

## 2024-04-30 MED ORDER — BUSPIRONE HCL 5 MG PO TABS
5.0000 mg | ORAL_TABLET | Freq: Two times a day (BID) | ORAL | Status: DC
  Administered 2024-04-30 – 2024-05-08 (×17): 5 mg via ORAL
  Filled 2024-04-30 (×17): qty 1

## 2024-04-30 MED ORDER — SODIUM CHLORIDE 0.9 % IV SOLN: INTRAVENOUS | Status: AC

## 2024-04-30 MED ORDER — FUROSEMIDE 10 MG/ML IJ SOLN
40.0000 mg | Freq: Two times a day (BID) | INTRAMUSCULAR | Status: DC
  Administered 2024-04-30 – 2024-05-03 (×6): 40 mg via INTRAVENOUS
  Filled 2024-04-30 (×6): qty 4

## 2024-04-30 MED ORDER — ALBUTEROL SULFATE (2.5 MG/3ML) 0.083% IN NEBU
2.5000 mg | INHALATION_SOLUTION | Freq: Four times a day (QID) | RESPIRATORY_TRACT | Status: DC
  Administered 2024-04-30 – 2024-05-01 (×4): 2.5 mg via RESPIRATORY_TRACT
  Filled 2024-04-30 (×4): qty 3

## 2024-04-30 MED ORDER — ORAL CARE MOUTH RINSE: 15.0000 mL | OROMUCOSAL | Status: DC | PRN

## 2024-04-30 MED ORDER — FUROSEMIDE 10 MG/ML IJ SOLN
40.0000 mg | Freq: Every day | INTRAMUSCULAR | Status: DC
  Administered 2024-04-30: 40 mg via INTRAVENOUS
  Filled 2024-04-30: qty 4

## 2024-04-30 MED ORDER — LOSARTAN POTASSIUM 50 MG PO TABS
100.0000 mg | ORAL_TABLET | Freq: Every morning | ORAL | Status: DC
  Administered 2024-04-30 – 2024-05-08 (×9): 100 mg via ORAL
  Filled 2024-04-30 (×9): qty 2

## 2024-04-30 MED ORDER — BENZONATATE 100 MG PO CAPS
100.0000 mg | ORAL_CAPSULE | Freq: Two times a day (BID) | ORAL | Status: DC | PRN
  Administered 2024-04-30 – 2024-05-02 (×3): 100 mg via ORAL
  Filled 2024-04-30 (×3): qty 1

## 2024-04-30 NOTE — Plan of Care (Signed)

## 2024-04-30 NOTE — Progress Notes (Signed)
 SPIRITUAL CARE AND COUNSELING CONSULT NOTE   VISIT SUMMARY   Reason for Visit: Chaplain visiting Pt in the hospital who was referred for Spiritual Care at IDT rounds.  Description of Visit: Upon entering the room I found Catherine West in the bed and her son was there as a support person.  Pt was previously known to me, therefore no introductions were needed.   I spoke with Pt to remind her of our time together in the past and we shared memories.  She was encouraged by our interaction.   Pt requested prayer and chaplain offered it.   Plan of Care: I will plan to follow up with this Pt throughout this hospitalization.   SPIRITUAL ENCOUNTER                                                                                                                                                                      Type of Visit: Initial Care provided to:: Patient, Family Referral source: Chaplain assessment Reason for visit: Routine spiritual support OnCall Visit: No   SPIRITUAL FRAMEWORK  Presenting Themes: Values and beliefs, Significant life change, Caregiving needs Values/beliefs: Catherine West professes Christian faith Community/Connection: Family Strengths: not reviewed at this time Needs/Challenges/Barriers: not reviewed at this time Patient Stress Factors: Not reviewed Family Stress Factors: Not reviewed   GOALS   Self/Personal Goals: not reviewed at this time Clinical Care Goals: not reviewed at this time   INTERVENTIONS   Spiritual Care Interventions Made: Compassionate presence, Reflective listening, Prayer    INTERVENTION OUTCOMES   Outcomes: Awareness of support  SPIRITUAL CARE PLAN   Spiritual Care Issues Still Outstanding: No further spiritual care needs at this time (see row info)    If immediate needs arise, please consult Zelda Salmon AMION schedule for chaplain coverage   Maude Roll, MDiv Chaplain, [facility] [email or web address] [phone] 04/30/2024 1:11 PM

## 2024-04-30 NOTE — Progress Notes (Signed)
 " Progress Note   Patient: Catherine West FMW:[id number] DOB: 11-14-1920 DOA: 04/28/2024  DOS: the patient was seen and examined on 04/30/2024   Brief hospital course:  89 y.o. female with medical history significant of HTN, HLD, hypothyroidism, depression, CKD 3,  HFrEF (echo 09/2022 EF 35-40%) who presents to the emergency department due to fall when patient rolled out of bed and sustained a bruise on her forehead causing hematoma.  Oxygen  level was also noted to drop into the 80s on room air, chest x-ray was suspicious for RML pneumonia.  Assessment and Plan:  Acute hypoxic respiratory failure - Initially requiring 2 L nasal cannula to maintain O2 sats in the 90s.  Subsequently working to wean O2 down to room air.  Acute right middle lobe pneumonia - Suspect aspiration pneumonia.  Noted cough and mild hypoxia.  Empiric Unasyn  plus azithromycin  on board.  Ordered albuterol  nebs. Tessalon  Perles.  Wean O2 as above.  Showing improvement in leukocytosis.  Acute on chronic HFrEF -Likely contributing to patient's hypoxia.  Will discontinue oral Lasix  and initiate on IV Lasix  40 mg daily.  Monitor urine output closely.  Acute kidney injury - Likely multifactorial etiology.  Gentle IV fluids NS at 75 for now.  Creatinine showing improvement.  Monitor urine output.  Hypertension - Starting patient's losartan  100 mg daily.  Hypothyroidism/hyperlipidemia/depression - Will resume home medication regiment.   Subjective: Patient resting comfortably this morning.  Feeling improved from yesterday.  Overall weak.  Denies any fever, chest pain, nausea, vomiting, abdominal pain.  Complaining of cough.  Physical Exam:  Vitals:   04/29/24 1330 04/29/24 1513 04/29/24 2138 04/30/24 0445  BP: (!) 145/92 (!) 157/64 139/61 (!) 192/81  Pulse: 82 85 (!) 43 78  Resp:   17 19  Temp:  98.3 F (36.8 C) 97.6 F (36.4 C) 98 F (36.7 C)  TempSrc:  Oral Oral Oral  SpO2: 96% (!) 89% 91% (!) 86%  Weight:     56 kg  Height:        GENERAL:  Alert, pleasant, no acute distress  HEENT:  EOMI, nasal cannula CARDIOVASCULAR:  RRR, no murmurs appreciated RESPIRATORY: Cough noted, poor air movement bilaterally GASTROINTESTINAL:  Soft, nontender, nondistended EXTREMITIES:  No LE edema bilaterally NEURO:  No new focal deficits appreciated SKIN:  No rashes noted PSYCH:  Appropriate mood and affect     Data Reviewed:  Imaging Studies: DG Chest 2 View Result Date: 04/29/2024 EXAM: 2 VIEW(S) XRAY OF THE CHEST 04/28/2024 11:43:27 PM COMPARISON: 10/29/2023 CLINICAL HISTORY: Eval for cough. FINDINGS: LUNGS AND PLEURA: Stable chronic interstitial markings. There are few nodular densities in the lateral right mid lung, slightly increased. No pleural effusion. No pneumothorax. HEART AND MEDIASTINUM: There are atherosclerotic calcifications of the aorta. No acute abnormality of the cardiac and mediastinal silhouettes. BONES AND SOFT TISSUES: Stable compression fracture in upper thoracic spine. IMPRESSION: 1. Patchy/nodular densities in the lateral right mid lung have slightly increased. These are indeterminate and may represent an infectious/inflammatory process, but a pulmonary nodule cannot be excluded. Recommend follow-up chest CT or short-term follow-up chest x-ray when acute symptoms resolve to reevaluate. Electronically signed by: Greig Pique MD 04/29/2024 12:30 AM EST RP Workstation: [id number]   CT Head Wo Contrast Result Date: 04/28/2024 EXAM: CT HEAD AND CERVICAL SPINE 04/28/2024 10:47:34 PM TECHNIQUE: CT of the head and cervical spine was performed without the administration of intravenous contrast. Multiplanar reformatted images are provided for review. Automated exposure control, iterative reconstruction, and/or weight based  adjustment of the mA/kV was utilized to reduce the radiation dose to as low as reasonably achievable. COMPARISON: None available. CLINICAL HISTORY: Head trauma, minor (Age >= 65y)  FINDINGS: CT HEAD BRAIN AND VENTRICLES: No acute intracranial hemorrhage. No mass effect or midline shift. No abnormal extra-axial fluid collection. No evidence of acute infarct. No hydrocephalus. Patchy white matter hypodensities are compatible with chronic microvascular ischemic disease. Cerebral atrophy. Calcific atherosclerosis. ORBITS: No acute abnormality. SINUSES AND MASTOIDS: No acute abnormality. SOFT TISSUES AND SKULL: No acute skull fracture. No acute soft tissue abnormality. CT CERVICAL SPINE BONES AND ALIGNMENT: No acute fracture or traumatic malalignment. Approximately 1-2 mm anterolisthesis of C4 on C5 and retrolisthesis of C5 on C6. DEGENERATIVE CHANGES: C5-C6 degenerative disc disease with disc height loss and endplate spurring. Facet and uncovertebral arthropathy with varying degrees of foraminal stenosis. SOFT TISSUES: No prevertebral soft tissue swelling. IMPRESSION: 1. No acute intracranial abnormality. 2. No acute fracture or traumatic malalignment of the cervical spine. Electronically signed by: Gilmore Molt 04/28/2024 11:21 PM EST RP Workstation: [id number]   CT Cervical Spine Wo Contrast Result Date: 04/28/2024 EXAM: CT HEAD AND CERVICAL SPINE 04/28/2024 10:47:34 PM TECHNIQUE: CT of the head and cervical spine was performed without the administration of intravenous contrast. Multiplanar reformatted images are provided for review. Automated exposure control, iterative reconstruction, and/or weight based adjustment of the mA/kV was utilized to reduce the radiation dose to as low as reasonably achievable. COMPARISON: None available. CLINICAL HISTORY: Head trauma, minor (Age >= 65y) FINDINGS: CT HEAD BRAIN AND VENTRICLES: No acute intracranial hemorrhage. No mass effect or midline shift. No abnormal extra-axial fluid collection. No evidence of acute infarct. No hydrocephalus. Patchy white matter hypodensities are compatible with chronic microvascular ischemic disease. Cerebral atrophy.  Calcific atherosclerosis. ORBITS: No acute abnormality. SINUSES AND MASTOIDS: No acute abnormality. SOFT TISSUES AND SKULL: No acute skull fracture. No acute soft tissue abnormality. CT CERVICAL SPINE BONES AND ALIGNMENT: No acute fracture or traumatic malalignment. Approximately 1-2 mm anterolisthesis of C4 on C5 and retrolisthesis of C5 on C6. DEGENERATIVE CHANGES: C5-C6 degenerative disc disease with disc height loss and endplate spurring. Facet and uncovertebral arthropathy with varying degrees of foraminal stenosis. SOFT TISSUES: No prevertebral soft tissue swelling. IMPRESSION: 1. No acute intracranial abnormality. 2. No acute fracture or traumatic malalignment of the cervical spine. Electronically signed by: Gilmore Molt 04/28/2024 11:21 PM EST RP Workstation: [id number]    There are no new results to review at this time.  Previous records (including but not limited to H&P, progress notes, nursing notes, TOC management) were reviewed in assessment of this patient.  Labs: CBC: Recent Labs  Lab 04/29/24 0011 04/30/24 0354  WBC 22.2* 15.9*  NEUTROABS 19.3*  --   HGB 10.8* 11.0*  HCT 33.4* 34.2*  MCV 97.7 97.4  PLT 247 268   Basic Metabolic Panel: Recent Labs  Lab 04/29/24 0011 04/30/24 0354  NA 133* 140  K 3.8 3.2*  CL 96* 100  CO2 28 29  GLUCOSE 122* 94  BUN 29* 21  CREATININE 1.46* 1.12*  CALCIUM 9.1 8.7*  MG  --  2.1   Liver Function Tests: Recent Labs  Lab 04/29/24 0011  AST 40  ALT 27  ALKPHOS 143*  BILITOT 0.5  PROT 7.0  ALBUMIN 3.1*   CBG: No results for input(s): GLUCAP in the last 168 hours.  Scheduled Meds:  amLODipine   5 mg Oral Daily   cholecalciferol   2,000 Units Oral Daily   cyanocobalamin   1,000 mcg Oral  Daily   famotidine   20 mg Oral Daily   ferrous sulfate   325 mg Oral Q breakfast   folic acid   1 mg Oral Daily   furosemide   40 mg Intravenous Daily   polyethylene glycol  17 g Oral Daily   senna-docusate  1 tablet Oral BID    sertraline   100 mg Oral Daily   Continuous Infusions:  sodium chloride  75 mL/hr at 04/30/24 1015   ampicillin -sulbactam (UNASYN ) IV 3 g (04/30/24 1017)   azithromycin  500 mg (04/30/24 0114)   PRN Meds:.benzonatate   Family Communication: None at bedside  Disposition: Status is: Inpatient Remains inpatient appropriate because: Respiratory failure, pneumonia     Time spent: 39 minutes  Length of inpatient stay: 1 days  Author: Carliss LELON Canales, DO 04/30/2024 10:45 AM  For on call review [email or web address].   "

## 2024-04-30 NOTE — Hospital Course (Signed)
 89 y.o. female with medical history significant of HTN, HLD, hypothyroidism, depression, CKD 3,  HFrEF (echo 09/2022 EF 35-40%) who presents to the emergency department due to fall when patient rolled out of bed and sustained a bruise on her forehead causing hematoma.  Oxygen  level was also noted to drop into the 80s on room air, chest x-ray was suspicious for RML pneumonia.   Assessment and Plan:   Acute hypoxic respiratory failure - Initially requiring 2 L nasal cannula to maintain O2 sats in the 90s.  Subsequently working to wean O2 down to room air.   Acute right middle lobe pneumonia - Suspect aspiration pneumonia.  Noted cough and mild hypoxia.  Empiric Unasyn  plus azithromycin  on board.  Ordered albuterol  nebs. Tessalon  Perles.  Wean O2 as above.  Showing improvement in leukocytosis.   Acute on chronic HFrEF -Likely contributing to patient's hypoxia.  Will discontinue oral Lasix  and initiate on IV Lasix  40 mg daily.  Monitor urine output closely.   Acute kidney injury - Likely multifactorial etiology.  Gentle IV fluids NS at 75 for now.  Creatinine showing improvement.  Monitor urine output.   Hypertension - Starting patient's losartan  100 mg daily.   Hypothyroidism/hyperlipidemia/depression - Will resume home medication regiment.

## 2024-05-01 LAB — BASIC METABOLIC PANEL WITH GFR
Anion gap: 7 (ref 5–15)
BUN: 20 mg/dL (ref 8–23)
CO2: 33 mmol/L — ABNORMAL HIGH (ref 22–32)
Calcium: 8.4 mg/dL — ABNORMAL LOW (ref 8.9–10.3)
Chloride: 104 mmol/L (ref 98–111)
Creatinine, Ser: 1.1 mg/dL — ABNORMAL HIGH (ref 0.44–1.00)
GFR, Estimated: 44 mL/min — ABNORMAL LOW
Glucose, Bld: 98 mg/dL (ref 70–99)
Potassium: 2.6 mmol/L — CL (ref 3.5–5.1)
Sodium: 144 mmol/L (ref 135–145)

## 2024-05-01 LAB — CBC
HCT: 31.2 % — ABNORMAL LOW (ref 36.0–46.0)
Hemoglobin: 10.2 g/dL — ABNORMAL LOW (ref 12.0–15.0)
MCH: 32.1 pg (ref 26.0–34.0)
MCHC: 32.7 g/dL (ref 30.0–36.0)
MCV: 98.1 fL (ref 80.0–100.0)
Platelets: 245 K/uL (ref 150–400)
RBC: 3.18 MIL/uL — ABNORMAL LOW (ref 3.87–5.11)
RDW: 13.9 % (ref 11.5–15.5)
WBC: 11.8 K/uL — ABNORMAL HIGH (ref 4.0–10.5)
nRBC: 0 % (ref 0.0–0.2)

## 2024-05-01 LAB — MAGNESIUM: Magnesium: 2.4 mg/dL (ref 1.7–2.4)

## 2024-05-01 MED ORDER — ALBUTEROL SULFATE (2.5 MG/3ML) 0.083% IN NEBU
2.5000 mg | INHALATION_SOLUTION | Freq: Three times a day (TID) | RESPIRATORY_TRACT | Status: DC
  Administered 2024-05-01 – 2024-05-03 (×5): 2.5 mg via RESPIRATORY_TRACT
  Filled 2024-05-01 (×6): qty 3

## 2024-05-01 MED ORDER — POTASSIUM CHLORIDE 10 MEQ/100ML IV SOLN
10.0000 meq | INTRAVENOUS | Status: AC
  Administered 2024-05-01 (×2): 10 meq via INTRAVENOUS
  Filled 2024-05-01 (×2): qty 100

## 2024-05-01 MED ORDER — POTASSIUM CHLORIDE CRYS ER 20 MEQ PO TBCR
40.0000 meq | EXTENDED_RELEASE_TABLET | Freq: Once | ORAL | Status: AC
  Administered 2024-05-01: 40 meq via ORAL
  Filled 2024-05-01: qty 2

## 2024-05-01 NOTE — TOC Progression Note (Signed)
 Transition of Care [facility]) - Progression Note    Patient Details  Name: Catherine West MRN: [id number] Date of Birth: 09/10/1920  [facility] CM/SW Contact  Catherine West, [place] Phone Number: 05/01/2024, 3:20 PM  Clinical Narrative:     CSW spoke with Vina at [facility] and updated her that patient will ready for DC tomorrow . Vina stated that she will come out to assess patient tomorrow morning and depending on her assessment she will let CSW know if they can accept back. According to PT, paula and patient, she is at baseline  but Vina will still need to assess. Will continue to follow.  Expected Discharge Plan: Assisted Living Barriers to Discharge: Continued Medical Work up       Expected Discharge Plan and Services In-house Referral: Clinical Social Work Discharge Planning Services: CM Consult Post Acute Care Choice: Resumption of Svcs/PTA Provider Living arrangements for the past 2 months: Assisted Living Facility                                       Social Drivers of Health (SDOH) Interventions SDOH Screenings   Food Insecurity: Patient Unable To Answer (04/29/2024)  Housing: Patient Unable To Answer (04/29/2024)  Transportation Needs: Patient Unable To Answer (04/29/2024)  Utilities: Patient Unable To Answer (04/29/2024)  Social Connections: Patient Unable To Answer (04/29/2024)  Tobacco Use: Low Risk (04/28/2024)    Readmission Risk Interventions    05/01/2024    3:20 PM  Readmission Risk Prevention Plan  Transportation Screening Complete  Home Care Screening Complete  Medication Review (RN CM) Complete

## 2024-05-01 NOTE — Plan of Care (Signed)
" °  Problem: Acute Rehab PT Goals(only PT should resolve) Goal: Pt Will Go Supine/Side To Sit Outcome: Progressing Flowsheets (Taken 05/01/2024 1217) Pt will go Supine/Side to Sit: with moderate assist Goal: Patient Will Perform Sitting Balance Outcome: Progressing Flowsheets (Taken 05/01/2024 1217) Patient will perform sitting balance: with moderate assist Goal: Patient Will Transfer Sit To/From Stand Outcome: Progressing Flowsheets (Taken 05/01/2024 1217) Patient will transfer sit to/from stand: with moderate assist Goal: Pt Will Transfer Bed To Chair/Chair To Bed Outcome: Progressing Flowsheets (Taken 05/01/2024 1217) Pt will Transfer Bed to Chair/Chair to Bed: with mod assist    12:17 PM, 05/01/2024 Rosaria Settler, PT, DPT [place] with [facility]  "

## 2024-05-01 NOTE — Evaluation (Signed)
 Occupational Therapy Evaluation Patient Details Name: Catherine West MRN: [id number] DOB: 23-Apr-1921 Today's Date: 05/01/2024   History of Present Illness   Catherine West is a 89 y.o. female with medical history significant of HFrEF (echo 09/2022 EF 35-40%, poss PFO)hyperlipidemia, hypertension, CKD 3, hypothyroidism, depression,. [name] brought to ED after rolling out of bed and sustaining head trauma.  While in ED patient noted to have cough, elevated white count, negative RSV, covid flu. She was afebrile. While sleeping, sats noted to decrease in the 80s on room air.    Chest xray suspicious for RML pneumonia  UA rare bacteria white cells, no nitrates.  Cratinine at baseline 1.4  Sodium 133  Alkphos 143  She was given 1L ns rocephin  and azithromycin  IV  Head trauma - head and spine CT negative for acute findings (per NP)     Clinical Impressions Pt agreeable to OT and PT co-evaluation, but very lethargic initially with limited history given. Pt required max A for bed mobility and mod to max A for EOB to chair transfer with RW. Pt reports assist at ALF for ADL's. Pt needed max/total assist for lower body dressing today. B UE limited at the shoulder bilaterally with general weakness as well. Pt left in the chair with call bell within reach and chair alarm set. Pt will benefit from continued OT in the hospital to increase strength, balance, and endurance for safe ADL's.        If plan is discharge home, recommend the following:   A lot of help with walking and/or transfers;A lot of help with bathing/dressing/bathroom;Assistance with cooking/housework;Assist for transportation;Help with stairs or ramp for entrance;Direct supervision/assist for medications management;Assistance with feeding     Functional Status Assessment   Patient has had a recent decline in their functional status and demonstrates the ability to make significant improvements in function in a reasonable and  predictable amount of time.     Equipment Recommendations   None recommended by OT             Precautions/Restrictions   Precautions Precautions: Fall Recall of Precautions/Restrictions: Impaired Restrictions Weight Bearing Restrictions Per Provider Order: No     Mobility Bed Mobility Overal bed mobility: Needs Assistance Bed Mobility: Supine to Sit     Supine to sit: Max assist     General bed mobility comments: Poor trunk control; assist for B LE and trunk control.    Transfers Overall transfer level: Needs assistance   Transfers: Sit to/from Stand, Bed to chair/wheelchair/BSC Sit to Stand: Max assist     Step pivot transfers: Max assist, Mod assist     General transfer comment: Max A to boost from EOB; mod to max A for steps to chair with pt actually able to bear some weight and step to chair without AD, but therapist support for balance.      Balance Overall balance assessment: Needs assistance Sitting-balance support: Feet supported, No upper extremity supported Sitting balance-Leahy Scale: Poor Sitting balance - Comments: seated at EOb Postural control: Right lateral lean Standing balance support: During functional activity Standing balance-Leahy Scale: Poor Standing balance comment: Assist to maintain balance by this therapist.                           ADL either performed or assessed with clinical judgement   ADL Overall ADL's : Needs assistance/impaired Eating/Feeding: Moderate assistance;Sitting;Minimal assistance   Grooming: Moderate assistance;Sitting   Upper Body Bathing:  Moderate assistance;Sitting   Lower Body Bathing: Maximal assistance;Total assistance;Sitting/lateral leans;Bed level   Upper Body Dressing : Moderate assistance;Sitting   Lower Body Dressing: Maximal assistance;Total assistance;Bed level Lower Body Dressing Details (indicate cue type and reason): Assisted to don socks at bed level. Toilet Transfer:  Moderate assistance;Maximal assistance;Stand-pivot Statistician Details (indicate cue type and reason): EOB to chair without AD. Toileting- Clothing Manipulation and Hygiene: Total assistance;Bed level               Vision Baseline Vision/History: 0 No visual deficits Ability to See in Adequate Light: 0 Adequate Vision Assessment?: No apparent visual deficits     Perception Perception: Not tested       Praxis Praxis: Not tested       Pertinent Vitals/Pain Pain Assessment Pain Assessment: Faces Faces Pain Scale: Hurts little more Pain Location: Lower extremities with mobility Pain Descriptors / Indicators: Grimacing, Moaning Pain Intervention(s): Limited activity within patient's tolerance, Monitored during session, Repositioned     Extremity/Trunk Assessment Upper Extremity Assessment Upper Extremity Assessment: Difficult to assess due to impaired cognition;Generalized weakness;RUE deficits/detail;LUE deficits/detail RUE Deficits / Details: 3-/5 shoulder flexion; Full P/ROM for shoulder flexion. Generally weak otherwise. LUE Deficits / Details: 3-/5 shoulder flexion; ~75% of available P/ROM for shoulder flexion. Generally weak otherwise.   Lower Extremity Assessment Lower Extremity Assessment: Defer to PT evaluation   Cervical / Trunk Assessment Cervical / Trunk Assessment: Kyphotic   Communication Communication Communication: Impaired Factors Affecting Communication: Hearing impaired   Cognition Arousal: Lethargic Behavior During Therapy: Flat affect, WFL for tasks assessed/performed Cognition: No family/caregiver present to determine baseline                               Following commands: Impaired Following commands impaired: Follows one step commands inconsistently     Cueing  General Comments   Cueing Techniques: Verbal cues;Visual cues;Tactile cues;Gestural cues  On supplemental O2 throughout the session.              Home  Living Family/patient expects to be discharged to:: Assisted living                             Home Equipment: Wheelchair - manual   Additional Comments: Chart indicates w/c use; pt lethargic initially and not providing much history.      Prior Functioning/Environment Prior Level of Function : Needs assist       Physical Assist : Mobility (physical);ADLs (physical) Mobility (physical): Transfers ADLs (physical): Bathing;Dressing;Toileting;IADLs Mobility Comments: Per chart, pt uses w/c for mobility, max/total assist for transfer from bed to w/c ADLs Comments: Pt reports assist for bathing, dressing, and toileting. LIkely assisted for most ADL's.    OT Problem List: Decreased strength;Decreased range of motion;Decreased activity tolerance;Impaired balance (sitting and/or standing);Decreased cognition;Decreased knowledge of use of DME or AE;Impaired UE functional use;Pain   OT Treatment/Interventions: Self-care/ADL training;Therapeutic exercise;DME and/or AE instruction;Energy conservation;Therapeutic activities;Cognitive remediation/compensation;Patient/family education;Balance training      OT Goals(Current goals can be found in the care plan section)   Acute Rehab OT Goals Patient Stated Goal: Improve function. OT Goal Formulation: With patient Time For Goal Achievement: 05/15/24 Potential to Achieve Goals: Fair   OT Frequency:  Min 2X/week    Co-evaluation PT/OT/SLP Co-Evaluation/Treatment: Yes Reason for Co-Treatment: To address functional/ADL transfers PT goals addressed during session: Mobility/safety with mobility OT goals addressed during session: ADL's and self-care  End of Session Equipment Utilized During Treatment: Gait belt;Oxygen   Activity Tolerance: Patient tolerated treatment well Patient left: in chair;with call bell/phone within reach;with chair alarm set  OT Visit Diagnosis: Unsteadiness on feet  (R26.81);Other abnormalities of gait and mobility (R26.89);Muscle weakness (generalized) (M62.81);History of falling (Z91.81)                Time: 8997-8982 OT Time Calculation (min): 15 min Charges:  OT General Charges $OT Visit: 1 Visit OT Evaluation $OT Eval Low Complexity: 1 Low  Melesio Madara OT, MOT  Jayson Person 05/01/2024, 12:30 PM

## 2024-05-01 NOTE — Plan of Care (Signed)

## 2024-05-01 NOTE — Progress Notes (Signed)
 " Progress Note   Patient: Catherine West FMW:[id number] DOB: 21-Mar-1921 DOA: 04/28/2024  DOS: the patient was seen and examined on 05/01/2024   Brief hospital course:  89 y.o. female with medical history significant of HTN, HLD, hypothyroidism, depression, CKD 3,  HFrEF (echo 09/2022 EF 35-40%) who presents to the emergency department due to fall when patient rolled out of bed and sustained a bruise on her forehead causing hematoma.  Oxygen  level was also noted to drop into the 80s on room air, chest x-ray was suspicious for RML pneumonia.   Assessment and Plan:   Acute hypoxic respiratory failure - Initially requiring 2 L nasal cannula to maintain O2 sats in the 90s.  Intermittently room down to room air.  Acute right middle lobe pneumonia - Suspect aspiration pneumonia.  Noted cough and mild hypoxia.  Empiric Unasyn  plus azithromycin  on board.  Ordered albuterol  nebs. Tessalon  Perles.  Wean O2 as above.  Showing continued improvement in leukocytosis.  Anticipate transition to p.o. regiment tomorrow.  Hypokalemia - Potassium 2.6 this morning.  Likely exacerbated by diuretic use and IV fluids.  Ordered both IV and p.o. supplementation.  Will recheck BMP and mag in AM.   Acute on chronic HFrEF -Likely contributing to patient's hypoxia.  Will discontinue oral Lasix  and initiate on IV Lasix  40 mg daily.  Monitor urine output closely.   Acute kidney injury - Likely multifactorial etiology.  Gentle IV fluids NS at 75 for now.  Creatinine showing improvement.  Monitor urine output.   Hypertension - Starting patient's losartan  100 mg daily.   Hypothyroidism/hyperlipidemia/depression - Will resume home medication regiment.   Subjective: Patient resting comfortably this morning.  No acute events overnight.  Denies worsening shortness of breath, fever, chills, chest pain, nausea, vomiting, abdominal pain.  Denies cough despite having productive cough.  Physical Exam:  Vitals:   04/30/24  1837 04/30/24 2028 05/01/24 0137 05/01/24 0325  BP:  (!) 149/62  (!) 146/96  Pulse:  79  72  Resp:  19  19  Temp:  98.1 F (36.7 C)  97.9 F (36.6 C)  TempSrc:  Oral  Oral  SpO2: 95% 97% 96% 97%  Weight:    55.2 kg  Height:        GENERAL:  Alert, pleasant, no acute distress  HEENT:  EOMI, nasal cannula CARDIOVASCULAR:  RRR, no murmurs appreciated RESPIRATORY: Cough noted, poor air movement bilaterally GASTROINTESTINAL:  Soft, nontender, nondistended EXTREMITIES:  No LE edema bilaterally NEURO:  No new focal deficits appreciated SKIN:  No rashes noted PSYCH:  Appropriate mood and affect    Data Reviewed:  Imaging Studies: DG Chest 2 View Result Date: 04/29/2024 EXAM: 2 VIEW(S) XRAY OF THE CHEST 04/28/2024 11:43:27 PM COMPARISON: 10/29/2023 CLINICAL HISTORY: Eval for cough. FINDINGS: LUNGS AND PLEURA: Stable chronic interstitial markings. There are few nodular densities in the lateral right mid lung, slightly increased. No pleural effusion. No pneumothorax. HEART AND MEDIASTINUM: There are atherosclerotic calcifications of the aorta. No acute abnormality of the cardiac and mediastinal silhouettes. BONES AND SOFT TISSUES: Stable compression fracture in upper thoracic spine. IMPRESSION: 1. Patchy/nodular densities in the lateral right mid lung have slightly increased. These are indeterminate and may represent an infectious/inflammatory process, but a pulmonary nodule cannot be excluded. Recommend follow-up chest CT or short-term follow-up chest x-ray when acute symptoms resolve to reevaluate. Electronically signed by: Greig Pique MD 04/29/2024 12:30 AM EST RP Workstation: [id number]   CT Head Wo Contrast Result Date: 04/28/2024 EXAM: CT  HEAD AND CERVICAL SPINE 04/28/2024 10:47:34 PM TECHNIQUE: CT of the head and cervical spine was performed without the administration of intravenous contrast. Multiplanar reformatted images are provided for review. Automated exposure control, iterative  reconstruction, and/or weight based adjustment of the mA/kV was utilized to reduce the radiation dose to as low as reasonably achievable. COMPARISON: None available. CLINICAL HISTORY: Head trauma, minor (Age >= 65y) FINDINGS: CT HEAD BRAIN AND VENTRICLES: No acute intracranial hemorrhage. No mass effect or midline shift. No abnormal extra-axial fluid collection. No evidence of acute infarct. No hydrocephalus. Patchy white matter hypodensities are compatible with chronic microvascular ischemic disease. Cerebral atrophy. Calcific atherosclerosis. ORBITS: No acute abnormality. SINUSES AND MASTOIDS: No acute abnormality. SOFT TISSUES AND SKULL: No acute skull fracture. No acute soft tissue abnormality. CT CERVICAL SPINE BONES AND ALIGNMENT: No acute fracture or traumatic malalignment. Approximately 1-2 mm anterolisthesis of C4 on C5 and retrolisthesis of C5 on C6. DEGENERATIVE CHANGES: C5-C6 degenerative disc disease with disc height loss and endplate spurring. Facet and uncovertebral arthropathy with varying degrees of foraminal stenosis. SOFT TISSUES: No prevertebral soft tissue swelling. IMPRESSION: 1. No acute intracranial abnormality. 2. No acute fracture or traumatic malalignment of the cervical spine. Electronically signed by: Gilmore Molt 04/28/2024 11:21 PM EST RP Workstation: [id number]   CT Cervical Spine Wo Contrast Result Date: 04/28/2024 EXAM: CT HEAD AND CERVICAL SPINE 04/28/2024 10:47:34 PM TECHNIQUE: CT of the head and cervical spine was performed without the administration of intravenous contrast. Multiplanar reformatted images are provided for review. Automated exposure control, iterative reconstruction, and/or weight based adjustment of the mA/kV was utilized to reduce the radiation dose to as low as reasonably achievable. COMPARISON: None available. CLINICAL HISTORY: Head trauma, minor (Age >= 65y) FINDINGS: CT HEAD BRAIN AND VENTRICLES: No acute intracranial hemorrhage. No mass effect or  midline shift. No abnormal extra-axial fluid collection. No evidence of acute infarct. No hydrocephalus. Patchy white matter hypodensities are compatible with chronic microvascular ischemic disease. Cerebral atrophy. Calcific atherosclerosis. ORBITS: No acute abnormality. SINUSES AND MASTOIDS: No acute abnormality. SOFT TISSUES AND SKULL: No acute skull fracture. No acute soft tissue abnormality. CT CERVICAL SPINE BONES AND ALIGNMENT: No acute fracture or traumatic malalignment. Approximately 1-2 mm anterolisthesis of C4 on C5 and retrolisthesis of C5 on C6. DEGENERATIVE CHANGES: C5-C6 degenerative disc disease with disc height loss and endplate spurring. Facet and uncovertebral arthropathy with varying degrees of foraminal stenosis. SOFT TISSUES: No prevertebral soft tissue swelling. IMPRESSION: 1. No acute intracranial abnormality. 2. No acute fracture or traumatic malalignment of the cervical spine. Electronically signed by: Gilmore Molt 04/28/2024 11:21 PM EST RP Workstation: [id number]    There are no new results to review at this time.  Previous records (including but not limited to H&P, progress notes, nursing notes, TOC management) were reviewed in assessment of this patient.  Labs: CBC: Recent Labs  Lab 04/29/24 0011 04/30/24 0354 05/01/24 0431  WBC 22.2* 15.9* 11.8*  NEUTROABS 19.3*  --   --   HGB 10.8* 11.0* 10.2*  HCT 33.4* 34.2* 31.2*  MCV 97.7 97.4 98.1  PLT 247 268 245   Basic Metabolic Panel: Recent Labs  Lab 04/29/24 0011 04/30/24 0354 05/01/24 0431  NA 133* 140 144  K 3.8 3.2* 2.6*  CL 96* 100 104  CO2 28 29 33*  GLUCOSE 122* 94 98  BUN 29* 21 20  CREATININE 1.46* 1.12* 1.10*  CALCIUM 9.1 8.7* 8.4*  MG  --  2.1 2.4   Liver Function Tests: Recent Labs  Lab 04/29/24 0011  AST 40  ALT 27  ALKPHOS 143*  BILITOT 0.5  PROT 7.0  ALBUMIN 3.1*   CBG: No results for input(s): GLUCAP in the last 168 hours.  Scheduled Meds:  albuterol   2.5 mg  Nebulization TID   amLODipine   5 mg Oral Daily   busPIRone   5 mg Oral BID   cholecalciferol   2,000 Units Oral Daily   cyanocobalamin   1,000 mcg Oral Daily   famotidine   20 mg Oral Daily   ferrous sulfate   325 mg Oral Q breakfast   folic acid   1 mg Oral Daily   furosemide   40 mg Intravenous BID   losartan   100 mg Oral q morning   polyethylene glycol  17 g Oral Daily   senna-docusate  1 tablet Oral BID   sertraline   100 mg Oral Daily   Continuous Infusions:  ampicillin -sulbactam (UNASYN ) IV Stopped (05/01/24 9072)   azithromycin  500 mg (04/30/24 2344)   PRN Meds:.benzonatate , mouth rinse  Family Communication: None at bedside  Disposition: Status is: Inpatient Remains inpatient appropriate because: Pneumonia     Time spent: 38 minutes  Length of inpatient stay: 2 days  Author: Carliss LELON Canales, DO 05/01/2024 11:05 AM  For on call review [email or web address].   "

## 2024-05-01 NOTE — Plan of Care (Signed)
" °  Problem: Acute Rehab OT Goals (only OT should resolve) Goal: Pt. Will Perform Grooming Flowsheets (Taken 05/01/2024 1232) Pt Will Perform Grooming:  with min assist  with contact guard assist  sitting Goal: Pt. Will Perform Upper Body Bathing Flowsheets (Taken 05/01/2024 1232) Pt Will Perform Upper Body Bathing:  with contact guard assist  sitting Goal: Pt. Will Perform Upper Body Dressing Flowsheets (Taken 05/01/2024 1232) Pt Will Perform Upper Body Dressing:  with contact guard assist  sitting Goal: Pt. Will Perform Lower Body Dressing Flowsheets (Taken 05/01/2024 1232) Pt Will Perform Lower Body Dressing:  with mod assist  sitting/lateral leans  bed level Goal: Pt. Will Transfer To Toilet Flowsheets (Taken 05/01/2024 1232) Pt Will Transfer to Toilet:  with min assist  stand pivot transfer Goal: Pt. Will Perform Toileting-Clothing Manipulation Flowsheets (Taken 05/01/2024 1232) Pt Will Perform Toileting - Clothing Manipulation and hygiene:  with mod assist  bed level  sitting/lateral leans Goal: Pt/Caregiver Will Perform Home Exercise Program Flowsheets (Taken 05/01/2024 1232) Pt/caregiver will Perform Home Exercise Program:  Increased strength  Increased ROM  Both right and left upper extremity  With minimal assist  Cortlan Dolin OT, MOT  "

## 2024-05-01 NOTE — Evaluation (Signed)
 Physical Therapy Evaluation Patient Details Name: Catherine West MRN: [id number] DOB: [birth year] Today's Date: 05/01/2024  History of Present Illness  Catherine West is a 89 y.o. female with medical history significant of HFrEF (echo 09/2022 EF 35-40%, poss PFO)hyperlipidemia, hypertension, CKD 3, hypothyroidism, depression,. [name] brought to ED after rolling out of bed and sustaining head trauma.  While in ED patient noted to have cough, elevated white count, negative RSV, covid flu. She was afebrile. While sleeping, sats noted to decrease in the 80s on room air.    Chest xray suspicious for RML pneumonia  UA rare bacteria white cells, no nitrates.  Cratinine at baseline 1.4  Sodium 133  Alkphos 143  She was given 1L ns rocephin  and azithromycin  IV  Head trauma - head and spine CT negative for acute findings   Clinical Impression  Patient appears agreeable to OT/PT co-evaluation. Patient initially lethargic at start of session but improves with mobility. Patient reports she receives assist for transfers and ADLs at ALF, unsure of amount but would suspect max. This date, patient requires max/total assist with all mobility, and donning socks while in bed. Patient reports some pain in BLE during mobility. Pt appears generally weak/frail, demonstrates decreased endurance, impaired balance, and has limited UE A/PROM with pain. Pt tolerates sitting in chair at end of session, call button in reach, chair alarm set, and all needs met. Patient will benefit from continued skilled physical therapy acutely and in recommended venue in order to address current deficits and improve overall function.        If plan is discharge home, recommend the following: A lot of help with walking and/or transfers;A lot of help with bathing/dressing/bathroom;Assist for transportation;Help with stairs or ramp for entrance   Can travel by private vehicle        Equipment Recommendations None recommended by PT   Recommendations for Other Services       Functional Status Assessment Patient has had a recent decline in their functional status and demonstrates the ability to make significant improvements in function in a reasonable and predictable amount of time.     Precautions / Restrictions Precautions Precautions: Fall Recall of Precautions/Restrictions: Impaired Restrictions Weight Bearing Restrictions Per Provider Order: No      Mobility  Bed Mobility Overal bed mobility: Needs Assistance Bed Mobility: Supine to Sit     Supine to sit: Max assist     General bed mobility comments: max/total assist with trunk control, LE handling, and scooting to EOB.    Transfers Overall transfer level: Needs assistance   Transfers: Sit to/from Stand, Bed to chair/wheelchair/BSC Sit to Stand: Max assist   Step pivot transfers: Max assist       General transfer comment: max/total assist with STS and step pivot, 1 person assist without AD, pt requires step by step cues for foot placement and weight shifting to clear foot for small steps, pt with very slow labored movement    Ambulation/Gait     General Gait Details: Uanbel to safely attempt this date, pt in w/c at baseline  Stairs            Wheelchair Mobility     Tilt Bed    Modified Rankin (Stroke Patients Only)       Balance Overall balance assessment: Needs assistance Sitting-balance support: Feet supported, No upper extremity supported Sitting balance-Leahy Scale: Poor Sitting balance - Comments: Seated EOB, R lateral lean Postural control: Right lateral lean Standing balance support: During functional  activity Standing balance-Leahy Scale: Poor Standing balance comment: requires max/total assist when standing for transfer           Pertinent Vitals/Pain Pain Assessment Pain Assessment: Faces Faces Pain Scale: Hurts little more Pain Location: Lower extremities with mobility Pain Descriptors / Indicators:  Grimacing, Moaning Pain Intervention(s): Monitored during session, Repositioned, Limited activity within patient's tolerance    Home Living Family/patient expects to be discharged to:: Assisted living                   Additional Comments: Pt initially lethargic during session, unable to obtain much history, towards end of session with more alertness pt reports that she receives max/total assist for transfers and ADLs at [facility]    Prior Function Prior Level of Function : Needs assist       Physical Assist : Mobility (physical);ADLs (physical) Mobility (physical): Transfers ADLs (physical): Dressing;Bathing Mobility Comments: Per chart, pt uses w/c for mobility, max/total assist for transfer from bed to w/c ADLs Comments: max/total assist for ADLs     Extremity/Trunk Assessment   Upper Extremity Assessment Upper Extremity Assessment: Defer to OT evaluation    Lower Extremity Assessment Lower Extremity Assessment: Generalized weakness (Not formally tested. pt appears generally weak and frail,. antigravity motions with LE minimal, requires max/total assist.)    Cervical / Trunk Assessment Cervical / Trunk Assessment: Kyphotic  Communication        Cognition Arousal: Lethargic Behavior During Therapy: Flat affect, WFL for tasks assessed/performed   PT - Cognitive impairments: No family/caregiver present to determine baseline           Following commands: Impaired Following commands impaired: Follows one step commands inconsistently     Cueing Cueing Techniques: Verbal cues, Visual cues, Tactile cues, Gestural cues     General Comments      Exercises     Assessment/Plan    PT Assessment Patient needs continued PT services;All further PT needs can be met in the next venue of care  PT Problem List Decreased strength;Decreased range of motion;Decreased activity tolerance;Decreased balance;Decreased mobility;Pain       PT Treatment Interventions DME  instruction;Balance training;Gait training;Functional mobility training;Therapeutic activities;Therapeutic exercise;Patient/family education    PT Goals (Current goals can be found in the Care Plan section)  Acute Rehab PT Goals Patient Stated Goal: Return to rehab/ALF PT Goal Formulation: With patient Time For Goal Achievement: 05/08/24 Potential to Achieve Goals: Good    Frequency Min 3X/week     Co-evaluation PT/OT/SLP Co-Evaluation/Treatment: Yes Reason for Co-Treatment: To address functional/ADL transfers PT goals addressed during session: Mobility/safety with mobility         AM-PAC PT 6 Clicks Mobility  Outcome Measure Help needed turning from your back to your side while in a flat bed without using bedrails?: A Lot Help needed moving from lying on your back to sitting on the side of a flat bed without using bedrails?: A Lot Help needed moving to and from a bed to a chair (including a wheelchair)?: A Lot Help needed standing up from a chair using your arms (e.g., wheelchair or bedside chair)?: A Lot Help needed to walk in hospital room?: Total Help needed climbing 3-5 steps with a railing? : Total 6 Click Score: 10    End of Session Equipment Utilized During Treatment: Gait belt Activity Tolerance: Patient limited by lethargy Patient left: in chair;with call bell/phone within reach;with chair alarm set   PT Visit Diagnosis: Other abnormalities of gait and mobility (R26.89);Difficulty in  walking, not elsewhere classified (R26.2);Adult, failure to thrive (R62.7);History of falling (Z91.81);Muscle weakness (generalized) (M62.81);Pain Pain - Right/Left:  (Both) Pain - part of body: Leg    Time: 1002-1017 PT Time Calculation (min) (ACUTE ONLY): 15 min   Charges:   PT Evaluation $PT Eval Low Complexity: 1 Low   PT General Charges $$ ACUTE PT VISIT: 1 Visit         12:15 PM, 05/01/2024 Imoni Kohen Powell-Butler, PT, DPT [place] with [facility]

## 2024-05-02 DIAGNOSIS — J690 Pneumonitis due to inhalation of food and vomit: Secondary | ICD-10-CM | POA: Diagnosis not present

## 2024-05-02 DIAGNOSIS — N179 Acute kidney failure, unspecified: Secondary | ICD-10-CM | POA: Diagnosis not present

## 2024-05-02 DIAGNOSIS — I5023 Acute on chronic systolic (congestive) heart failure: Secondary | ICD-10-CM | POA: Diagnosis not present

## 2024-05-02 DIAGNOSIS — N189 Chronic kidney disease, unspecified: Secondary | ICD-10-CM | POA: Diagnosis not present

## 2024-05-02 DIAGNOSIS — J9601 Acute respiratory failure with hypoxia: Secondary | ICD-10-CM | POA: Diagnosis not present

## 2024-05-02 LAB — RESP PANEL BY RT-PCR (RSV, FLU A&B, COVID)  RVPGX2
Influenza A by PCR: NEGATIVE
Influenza B by PCR: NEGATIVE
Resp Syncytial Virus by PCR: NEGATIVE
SARS Coronavirus 2 by RT PCR: NEGATIVE

## 2024-05-02 LAB — CBC
HCT: 32.1 % — ABNORMAL LOW (ref 36.0–46.0)
Hemoglobin: 10.4 g/dL — ABNORMAL LOW (ref 12.0–15.0)
MCH: 31.6 pg (ref 26.0–34.0)
MCHC: 32.4 g/dL (ref 30.0–36.0)
MCV: 97.6 fL (ref 80.0–100.0)
Platelets: 241 K/uL (ref 150–400)
RBC: 3.29 MIL/uL — ABNORMAL LOW (ref 3.87–5.11)
RDW: 13.9 % (ref 11.5–15.5)
WBC: 11.5 K/uL — ABNORMAL HIGH (ref 4.0–10.5)
nRBC: 0 % (ref 0.0–0.2)

## 2024-05-02 LAB — BASIC METABOLIC PANEL WITH GFR
Anion gap: 7 (ref 5–15)
BUN: 18 mg/dL (ref 8–23)
CO2: 34 mmol/L — ABNORMAL HIGH (ref 22–32)
Calcium: 8.7 mg/dL — ABNORMAL LOW (ref 8.9–10.3)
Chloride: 100 mmol/L (ref 98–111)
Creatinine, Ser: 1.15 mg/dL — ABNORMAL HIGH (ref 0.44–1.00)
GFR, Estimated: 42 mL/min — ABNORMAL LOW
Glucose, Bld: 87 mg/dL (ref 70–99)
Potassium: 2.9 mmol/L — ABNORMAL LOW (ref 3.5–5.1)
Sodium: 142 mmol/L (ref 135–145)

## 2024-05-02 LAB — MAGNESIUM: Magnesium: 1.9 mg/dL (ref 1.7–2.4)

## 2024-05-02 MED ORDER — POTASSIUM CHLORIDE 10 MEQ/100ML IV SOLN
10.0000 meq | INTRAVENOUS | Status: DC
  Filled 2024-05-02: qty 100

## 2024-05-02 MED ORDER — POTASSIUM CHLORIDE 10 MEQ/100ML IV SOLN
10.0000 meq | INTRAVENOUS | Status: AC
  Administered 2024-05-02 (×4): 10 meq via INTRAVENOUS
  Filled 2024-05-02 (×3): qty 100

## 2024-05-02 MED ORDER — SODIUM CHLORIDE 0.9 % IV SOLN: INTRAVENOUS | Status: AC | PRN

## 2024-05-02 NOTE — Plan of Care (Signed)
  Problem: Clinical Measurements: Goal: Respiratory complications will improve Outcome: Progressing   Problem: Coping: Goal: Level of anxiety will decrease Outcome: Progressing   Problem: Pain Managment: Goal: General experience of comfort will improve and/or be controlled Outcome: Progressing

## 2024-05-02 NOTE — Plan of Care (Signed)

## 2024-05-02 NOTE — Progress Notes (Signed)
 Pt cleaned of moderate amount of incontinent,dark brown type 5 stool. Wounds on sacrum, left heel and left shin cleaned, measured and re-dressed. Pt able to stand and using FWW ambulated 6 shuffle steps from bed to chair, able to bear own full weight but unsteady on feet. Pt states, Ow!, every time she is touched but when asked, she denies any c/o pain. She states, I'm tender. Chair alarm on, call bell within reach for safety, advised to call for needs, pt able to give return demonstration of use of call bell.

## 2024-05-02 NOTE — NC FL2 (Signed)
 " Catherine West  MEDICAID FL2 LEVEL OF CARE FORM     IDENTIFICATION  Patient Name: Catherine West Birthdate: July 15, 1920 Sex: female Admission Date (Current Location): 04/28/2024  [facility] and [place] Number:  Catherine West and Address:  [facility],  [address], [place] [address]      Provider Number: [id number]  Attending Physician Name and Address:  Catherine Afton CROME, MD  Relative Name and Phone Number:  Catherine West  Daughter, Emergency Contact  [phone]    Current Level of Care: Hospital Recommended Level of Care: Skilled Nursing Facility Prior Approval Number:    Date Approved/Denied:   PASRR Number: [id number]  Discharge Plan: SNF    Current Diagnoses: Patient Active Problem List   Diagnosis Date Noted   Acute hypoxic respiratory failure (HCC) 04/30/2024   Pressure injury of skin 04/30/2024   Aspiration pneumonia (HCC) 04/29/2024   Acute kidney injury superimposed on chronic kidney disease 01/21/2023   Acute respiratory failure with hypoxia (HCC) 01/20/2023   Acute renal failure superimposed on stage 3b chronic kidney disease (HCC) 01/20/2023   Right lower lobe pneumonia 12/09/2022   Pubic ramus fracture, left, sequela 12/08/2022   Benign hypertension with stage 3b chronic kidney disease (HCC) 12/08/2022   Chronic constipation 12/08/2022   Macrocytic anemia 12/08/2022   Folate deficiency 12/08/2022   Vitamin B 12 deficiency 12/08/2022   GERD without esophagitis 12/08/2022   Aortic atherosclerosis 12/08/2022   Acute on chronic heart failure with reduced ejection fraction (HFrEF, <= 40%) (HCC) 12/08/2022   Stage 3b chronic kidney disease (HCC) 12/08/2022   Compression fracture of L1 lumbar vertebra (HCC) 12/03/2022   Leukocytosis 12/03/2022   Basal cell carcinoma (BCC) of abdomen 11/10/2022   Acute CHF (HCC) 09/04/2022   Essential (primary) hypertension    Hyperlipidemia, unspecified    Major depressive disorder, single episode,  unspecified    Hypothyroidism, unspecified    Osteoporosis    Gait abnormality 02/22/2019   Falls frequently 02/22/2019    Orientation RESPIRATION BLADDER Height & Weight     Self, Place  O2 (2-3L) Incontinent Weight: 122 lb 12.7 oz (55.7 kg) Height:  5' 2 (157.5 cm)  BEHAVIORAL SYMPTOMS/MOOD NEUROLOGICAL BOWEL NUTRITION STATUS      Incontinent Diet (heart - see DC summary)  AMBULATORY STATUS COMMUNICATION OF NEEDS Skin   Extensive Assist Verbally PU Stage and Appropriate Care (Pressure Injury Sacrum Medial Stage 1) PU Stage 1 Dressing: Daily                     Personal Care Assistance Level of Assistance  Bathing, Feeding, Dressing Bathing Assistance: Maximum assistance Feeding assistance: Independent Dressing Assistance: Maximum assistance     Functional Limitations Info  Sight, Hearing, Speech Sight Info: Adequate Hearing Info: Adequate Speech Info: Adequate    SPECIAL CARE FACTORS FREQUENCY  PT (By licensed PT), OT (By licensed OT)     PT Frequency: 5x/wk OT Frequency: 5x/wk            Contractures Contractures Info: Not present    Additional Factors Info  Code Status, Allergies Code Status Info: DNR Allergies Info: NKA           Current Medications (05/02/2024):  This is the current hospital active medication list Current Facility-Administered Medications  Medication Dose Route Frequency Provider Last Rate Last Admin   albuterol  (PROVENTIL ) (2.5 MG/3ML) 0.083% nebulizer solution 2.5 mg  2.5 mg Nebulization TID Arlon Honey W, DO   2.5 mg at 05/01/24  1839   amLODipine  (NORVASC ) tablet 5 mg  5 mg Oral Daily Jesus America, NP   5 mg at 05/01/24 0900   Ampicillin -Sulbactam (UNASYN ) 3 g in sodium chloride  0.9 % 100 mL IVPB  3 g Intravenous BID Adefeso, Oladapo, DO   Stopped at 05/01/24 2239   azithromycin  (ZITHROMAX ) 500 mg in sodium chloride  0.9 % 250 mL IVPB  500 mg Intravenous Q24H Mesner, Selinda, MD   Stopped at 05/02/24 0107   benzonatate   (TESSALON ) capsule 100 mg  100 mg Oral BID PRN Arlon Carliss ORN, DO   100 mg at 05/01/24 1129   busPIRone  (BUSPAR ) tablet 5 mg  5 mg Oral BID Arlon Carliss ORN, DO   5 mg at 05/01/24 2201   cholecalciferol  (VITAMIN D3) 25 MCG (1000 UNIT) tablet 2,000 Units  2,000 Units Oral Daily Jesus America, NP   2,000 Units at 05/01/24 0900   cyanocobalamin  (VITAMIN B12) tablet 1,000 mcg  1,000 mcg Oral Daily Jesus America, NP   1,000 mcg at 05/01/24 0900   famotidine  (PEPCID ) tablet 20 mg  20 mg Oral Daily Jesus America, NP   20 mg at 05/01/24 0900   ferrous sulfate  tablet 325 mg  325 mg Oral Q breakfast Jesus America, NP   325 mg at 05/01/24 0900   folic acid  (FOLVITE ) tablet 1 mg  1 mg Oral Daily Jesus America, NP   1 mg at 05/01/24 0900   furosemide  (LASIX ) injection 40 mg  40 mg Intravenous BID Arlon Carliss ORN, DO   40 mg at 05/01/24 1658   losartan  (COZAAR ) tablet 100 mg  100 mg Oral q morning Arlon Carliss ORN, DO   100 mg at 05/01/24 0900   Oral care mouth rinse  15 mL Mouth Rinse PRN Arlon Carliss ORN, DO       polyethylene glycol (MIRALAX  / GLYCOLAX ) packet 17 g  17 g Oral Daily Jesus America, NP   17 g at 04/30/24 9092   potassium chloride  10 mEq in 100 mL IVPB  10 mEq Intravenous Q1 Hr x 4 Johnson, Clanford L, MD       senna-docusate (Senokot-S) tablet 1 tablet  1 tablet Oral BID Jesus America, NP   1 tablet at 05/01/24 2201   sertraline  (ZOLOFT ) tablet 100 mg  100 mg Oral Daily Jesus America, NP   100 mg at 05/01/24 0900     Discharge Medications: Please see discharge summary for a list of discharge medications.  Relevant Imaging Results:  Relevant Lab Results:   Additional Information SSN: [id number]  Hoy Catherine Bigness, LCSW     "

## 2024-05-02 NOTE — TOC Progression Note (Signed)
 Transition of Care [facility]) - Progression Note    Patient Details  Name: Catherine West MRN: [id number] Date of Birth: January 03, 1921  [facility]) CM/SW Contact  Hoy DELENA Bigness, LCSW Phone Number: 05/02/2024, 9:31 AM  Clinical Narrative:    Spoke with Vina at [facility] who shares that she came to assess pt and that due to pt's functioning status, need for continuous O2 and neb treatments pt is unable to return to their facility at this time.  CSW spoke with pt's daughter, who is agreeable to SNF placement and prefers placement at [facility]. Referrals have been faxed out and currently awaiting bed offers.     Expected Discharge Plan: Assisted Living Barriers to Discharge: Continued Medical Work up               Expected Discharge Plan and Services In-house Referral: Clinical Social Work Discharge Planning Services: CM Consult Post Acute Care Choice: Resumption of Svcs/PTA Provider Living arrangements for the past 2 months: Assisted Living Facility                                       Social Drivers of Health (SDOH) Interventions SDOH Screenings   Food Insecurity: Patient Unable To Answer (04/29/2024)  Housing: Patient Unable To Answer (04/29/2024)  Transportation Needs: Patient Unable To Answer (04/29/2024)  Utilities: Patient Unable To Answer (04/29/2024)  Social Connections: Patient Unable To Answer (04/29/2024)  Tobacco Use: Low Risk (04/28/2024)    Readmission Risk Interventions    05/01/2024    3:20 PM  Readmission Risk Prevention Plan  Transportation Screening Complete  Home Care Screening Complete  Medication Review (RN CM) Complete

## 2024-05-03 DIAGNOSIS — J9601 Acute respiratory failure with hypoxia: Secondary | ICD-10-CM | POA: Diagnosis not present

## 2024-05-03 DIAGNOSIS — I5023 Acute on chronic systolic (congestive) heart failure: Secondary | ICD-10-CM | POA: Diagnosis not present

## 2024-05-03 DIAGNOSIS — J690 Pneumonitis due to inhalation of food and vomit: Secondary | ICD-10-CM | POA: Diagnosis not present

## 2024-05-03 DIAGNOSIS — N179 Acute kidney failure, unspecified: Secondary | ICD-10-CM | POA: Diagnosis not present

## 2024-05-03 LAB — BASIC METABOLIC PANEL WITH GFR
Anion gap: 15 (ref 5–15)
BUN: 16 mg/dL (ref 8–23)
CO2: 26 mmol/L (ref 22–32)
Calcium: 8.7 mg/dL — ABNORMAL LOW (ref 8.9–10.3)
Chloride: 99 mmol/L (ref 98–111)
Creatinine, Ser: 1.11 mg/dL — ABNORMAL HIGH (ref 0.44–1.00)
GFR, Estimated: 44 mL/min — ABNORMAL LOW
Glucose, Bld: 98 mg/dL (ref 70–99)
Potassium: 3.3 mmol/L — ABNORMAL LOW (ref 3.5–5.1)
Sodium: 139 mmol/L (ref 135–145)

## 2024-05-03 LAB — MAGNESIUM: Magnesium: 2 mg/dL (ref 1.7–2.4)

## 2024-05-03 MED ORDER — AMOXICILLIN-POT CLAVULANATE 500-125 MG PO TABS
1.0000 | ORAL_TABLET | Freq: Two times a day (BID) | ORAL | Status: AC
  Administered 2024-05-04 – 2024-05-05 (×4): 1 via ORAL
  Filled 2024-05-03 (×4): qty 1

## 2024-05-03 MED ORDER — POTASSIUM CHLORIDE CRYS ER 20 MEQ PO TBCR
20.0000 meq | EXTENDED_RELEASE_TABLET | Freq: Two times a day (BID) | ORAL | Status: AC
  Administered 2024-05-03 (×2): 20 meq via ORAL
  Filled 2024-05-03 (×2): qty 1

## 2024-05-03 MED ORDER — AMOXICILLIN-POT CLAVULANATE 875-125 MG PO TABS: 1.0000 | ORAL_TABLET | Freq: Two times a day (BID) | ORAL | Status: DC

## 2024-05-03 MED ORDER — TORSEMIDE 20 MG PO TABS
20.0000 mg | ORAL_TABLET | Freq: Every day | ORAL | Status: DC
  Administered 2024-05-04 – 2024-05-05 (×2): 20 mg via ORAL
  Filled 2024-05-03 (×2): qty 1

## 2024-05-03 MED ORDER — ALBUTEROL SULFATE (2.5 MG/3ML) 0.083% IN NEBU: 2.5000 mg | INHALATION_SOLUTION | Freq: Four times a day (QID) | RESPIRATORY_TRACT | Status: DC | PRN

## 2024-05-03 NOTE — Plan of Care (Signed)
   Problem: Activity: Goal: Risk for activity intolerance will decrease Outcome: Progressing   Problem: Coping: Goal: Level of anxiety will decrease Outcome: Progressing

## 2024-05-03 NOTE — Plan of Care (Signed)

## 2024-05-03 NOTE — Hospital Course (Signed)
 89 y.o. female with medical history significant of HTN, HLD, hypothyroidism, depression, CKD 3,  HFrEF (echo 09/2022 EF 35-40%) who presents to the emergency department due to fall when patient rolled out of bed and sustained a bruise on her forehead causing hematoma.  Oxygen  level was also noted to drop into the 80s on room air, chest x-ray was suspicious for RML pneumonia.

## 2024-05-03 NOTE — Progress Notes (Signed)
 " PROGRESS NOTE   Catherine West  FMW:[id number] DOB: May 10, 1920 DOA: 04/28/2024 PCP: Shona Norleen PEDLAR, MD   Chief Complaint  Patient presents with   Fall   Level of care: Med-Surg  Brief Admission History:  89 y.o. female with medical history significant of HTN, HLD, hypothyroidism, depression, CKD 3,  HFrEF (echo 09/2022 EF 35-40%) who presents to the emergency department due to fall when patient rolled out of bed and sustained a bruise on her forehead causing hematoma.  Oxygen  level was also noted to drop into the 80s on room air, chest x-ray was suspicious for RML pneumonia.    Assessment and Plan:  Acute hypoxic respiratory failure - Initially requiring 2 L nasal cannula to maintain O2 sats in the 90s.  Intermittently room down to room air.   Acute right middle lobe pneumonia - Suspect aspiration pneumonia.  Noted cough and mild hypoxia.  DC unasyn /azithromycin  today.  Tomorrow start Augmentin x 2 more days to complete course    Hypokalemia - this is being repleted, and it is improving    Acute on chronic HFrEF -she has diuresed well on IV lasix , plan to start oral torsemide 20 mg tomorrow once daily with potassium supplement   AKI on CKD stage 3b  - renal function stabilized now, she likely has stage 3b CKD    Hypertension -  losartan  100 mg daily.   Hypothyroidism/hyperlipidemia/depression - Will resume home medication regiment.  DVT prophylaxis: SCDs Code Status: DNR  Family Communication:  Disposition: awaiting SNF rehab placement    Consultants:   Procedures:   Subjective: Pt resting comfortably today.   Objective: Vitals:   05/03/24 0500 05/03/24 0502 05/03/24 0602 05/03/24 0751  BP:  (!) 128/55 (!) 174/72   Pulse:  69 82   Resp:  18 17   Temp:  98.1 F (36.7 C) 98 F (36.7 C)   TempSrc:  Oral Oral   SpO2:  96% 96% 95%  Weight: 54.5 kg     Height:        Intake/Output Summary (Last 24 hours) at 05/03/2024 1229 Last data filed at 05/03/2024  0014 Gross per 24 hour  Intake 1798.39 ml  Output 850 ml  Net 948.39 ml   Filed Weights   05/01/24 0325 05/02/24 0518 05/03/24 0500  Weight: 55.2 kg 55.7 kg 54.5 kg   Examination:  General exam: frail, elderly female, awake, alert, cooperative, Appears calm and comfortable  Respiratory system:  Respiratory effort normal. Cardiovascular system: normal S1 & S2 heard. No JVD, murmurs, rubs, gallops or clicks. No pedal edema. Gastrointestinal system: Abdomen is nondistended, soft and nontender. No organomegaly or masses felt. Normal bowel sounds heard. Central nervous system: Alert and oriented. No focal neurological deficits. Extremities: Symmetric 5 x 5 power. Skin: No rashes, lesions or ulcers. Psychiatry: Judgement and insight appear normal. Mood & affect appropriate.   Data Reviewed: I have personally reviewed following labs and imaging studies  CBC: Recent Labs  Lab 04/29/24 0011 04/30/24 0354 05/01/24 0431 05/02/24 0425  WBC 22.2* 15.9* 11.8* 11.5*  NEUTROABS 19.3*  --   --   --   HGB 10.8* 11.0* 10.2* 10.4*  HCT 33.4* 34.2* 31.2* 32.1*  MCV 97.7 97.4 98.1 97.6  PLT 247 268 245 241    Basic Metabolic Panel: Recent Labs  Lab 04/29/24 0011 04/30/24 0354 05/01/24 0431 05/02/24 0425 05/03/24 0806  NA 133* 140 144 142 139  K 3.8 3.2* 2.6* 2.9* 3.3*  CL 96* 100 104  100 99  CO2 28 29 33* 34* 26  GLUCOSE 122* 94 98 87 98  BUN 29* 21 20 18 16   CREATININE 1.46* 1.12* 1.10* 1.15* 1.11*  CALCIUM 9.1 8.7* 8.4* 8.7* 8.7*  MG  --  2.1 2.4 1.9 2.0   CBG: No results for input(s): GLUCAP in the last 168 hours.  Recent Results (from the past 240 hours)  Resp panel by RT-PCR (RSV, Flu A&B, Covid) Anterior Nasal Swab     Status: None   Collection Time: 04/29/24 12:05 AM   Specimen: Anterior Nasal Swab  Result Value Ref Range Status   SARS Coronavirus 2 by RT PCR NEGATIVE NEGATIVE Final    Comment: (NOTE) SARS-CoV-2 target nucleic acids are NOT DETECTED.  The  SARS-CoV-2 RNA is generally detectable in upper respiratory specimens during the acute phase of infection. The lowest concentration of SARS-CoV-2 viral copies this assay can detect is 138 copies/mL. A negative result does not preclude SARS-Cov-2 infection and should not be used as the sole basis for treatment or other patient management decisions. A negative result may occur with  improper specimen collection/handling, submission of specimen other than nasopharyngeal swab, presence of viral mutation(s) within the areas targeted by this assay, and inadequate number of viral copies(<138 copies/mL). A negative result must be combined with clinical observations, patient history, and epidemiological information. The expected result is Negative.  Fact Sheet for Patients:  bloggercourse.com  Fact Sheet for Healthcare Providers:  seriousbroker.it  This test is no t yet approved or cleared by the [place]  FDA and  has been authorized for detection and/or diagnosis of SARS-CoV-2 by FDA under an Emergency Use Authorization (EUA). This EUA will remain  in effect (meaning this test can be used) for the duration of the COVID-19 declaration under Section 564(b)(1) of the Act, 21 U.S.C.section 360bbb-3(b)(1), unless the authorization is terminated  or revoked sooner.       Influenza A by PCR NEGATIVE NEGATIVE Final   Influenza B by PCR NEGATIVE NEGATIVE Final    Comment: (NOTE) The Xpert Xpress SARS-CoV-2/FLU/RSV plus assay is intended as an aid in the diagnosis of influenza from Nasopharyngeal swab specimens and should not be used as a sole basis for treatment. Nasal washings and aspirates are unacceptable for Xpert Xpress SARS-CoV-2/FLU/RSV testing.  Fact Sheet for Patients: bloggercourse.com  Fact Sheet for Healthcare Providers: seriousbroker.it  This test is not yet approved or  cleared by the [place]  FDA and has been authorized for detection and/or diagnosis of SARS-CoV-2 by FDA under an Emergency Use Authorization (EUA). This EUA will remain in effect (meaning this test can be used) for the duration of the COVID-19 declaration under Section 564(b)(1) of the Act, 21 U.S.C. section 360bbb-3(b)(1), unless the authorization is terminated or revoked.     Resp Syncytial Virus by PCR NEGATIVE NEGATIVE Final    Comment: (NOTE) Fact Sheet for Patients: bloggercourse.com  Fact Sheet for Healthcare Providers: seriousbroker.it  This test is not yet approved or cleared by the [place]  FDA and has been authorized for detection and/or diagnosis of SARS-CoV-2 by FDA under an Emergency Use Authorization (EUA). This EUA will remain in effect (meaning this test can be used) for the duration of the COVID-19 declaration under Section 564(b)(1) of the Act, 21 U.S.C. section 360bbb-3(b)(1), unless the authorization is terminated or revoked.  Performed at [facility], [address], [place], [place] [address]   Blood culture (routine x 2)     Status: None (Preliminary result)  Collection Time: 04/29/24 12:58 AM   Specimen: Right Antecubital; Blood  Result Value Ref Range Status   Specimen Description RIGHT ANTECUBITAL  Final   Special Requests   Final    BOTTLES DRAWN AEROBIC AND ANAEROBIC Blood Culture adequate volume   Culture   Final    NO GROWTH 4 DAYS Performed at [facility], [address] [place], [place] [address]    Report Status PENDING  Incomplete  Blood culture (routine x 2)     Status: None (Preliminary result)   Collection Time: 04/29/24  1:01 AM   Specimen: Left Antecubital; Blood  Result Value Ref Range Status   Specimen Description LEFT ANTECUBITAL  Final   Special Requests   Final    BOTTLES DRAWN AEROBIC AND ANAEROBIC Blood Culture adequate volume   Culture   Final    NO GROWTH 4  DAYS Performed at [facility], [address], [place], [place] [address]    Report Status PENDING  Incomplete  Resp panel by RT-PCR (RSV, Flu A&B, Covid) Anterior Nasal Swab     Status: None   Collection Time: 05/02/24 12:37 PM   Specimen: Anterior Nasal Swab  Result Value Ref Range Status   SARS Coronavirus 2 by RT PCR NEGATIVE NEGATIVE Final    Comment: (NOTE) SARS-CoV-2 target nucleic acids are NOT DETECTED.  The SARS-CoV-2 RNA is generally detectable in upper respiratory specimens during the acute phase of infection. The lowest concentration of SARS-CoV-2 viral copies this assay can detect is 138 copies/mL. A negative result does not preclude SARS-Cov-2 infection and should not be used as the sole basis for treatment or other patient management decisions. A negative result may occur with  improper specimen collection/handling, submission of specimen other than nasopharyngeal swab, presence of viral mutation(s) within the areas targeted by this assay, and inadequate number of viral copies(<138 copies/mL). A negative result must be combined with clinical observations, patient history, and epidemiological information. The expected result is Negative.  Fact Sheet for Patients:  bloggercourse.com  Fact Sheet for Healthcare Providers:  seriousbroker.it  This test is no t yet approved or cleared by the [place]  FDA and  has been authorized for detection and/or diagnosis of SARS-CoV-2 by FDA under an Emergency Use Authorization (EUA). This EUA will remain  in effect (meaning this test can be used) for the duration of the COVID-19 declaration under Section 564(b)(1) of the Act, 21 U.S.C.section 360bbb-3(b)(1), unless the authorization is terminated  or revoked sooner.       Influenza A by PCR NEGATIVE NEGATIVE Final   Influenza B by PCR NEGATIVE NEGATIVE Final    Comment: (NOTE) The Xpert Xpress SARS-CoV-2/FLU/RSV plus  assay is intended as an aid in the diagnosis of influenza from Nasopharyngeal swab specimens and should not be used as a sole basis for treatment. Nasal washings and aspirates are unacceptable for Xpert Xpress SARS-CoV-2/FLU/RSV testing.  Fact Sheet for Patients: bloggercourse.com  Fact Sheet for Healthcare Providers: seriousbroker.it  This test is not yet approved or cleared by the [place]  FDA and has been authorized for detection and/or diagnosis of SARS-CoV-2 by FDA under an Emergency Use Authorization (EUA). This EUA will remain in effect (meaning this test can be used) for the duration of the COVID-19 declaration under Section 564(b)(1) of the Act, 21 U.S.C. section 360bbb-3(b)(1), unless the authorization is terminated or revoked.     Resp Syncytial Virus by PCR NEGATIVE NEGATIVE Final    Comment: (NOTE) Fact Sheet for Patients: bloggercourse.com  Fact Sheet for Healthcare Providers: seriousbroker.it  This test is not yet approved or cleared by the [place]  FDA and has been authorized for detection and/or diagnosis of SARS-CoV-2 by FDA under an Emergency Use Authorization (EUA). This EUA will remain in effect (meaning this test can be used) for the duration of the COVID-19 declaration under Section 564(b)(1) of the Act, 21 U.S.C. section 360bbb-3(b)(1), unless the authorization is terminated or revoked.  Performed at [facility], [address] [place], [place] [address]      Radiology Studies: No results found.  Scheduled Meds:  amLODipine   5 mg Oral Daily   [START ON 05/04/2024] amoxicillin-clavulanate  1 tablet Oral Q12H   busPIRone   5 mg Oral BID   cholecalciferol   2,000 Units Oral Daily   cyanocobalamin   1,000 mcg Oral Daily   famotidine   20 mg Oral Daily   ferrous sulfate   325 mg Oral Q breakfast   folic acid   1 mg Oral Daily   losartan   100 mg  Oral q morning   polyethylene glycol  17 g Oral Daily   potassium chloride   20 mEq Oral BID   senna-docusate  1 tablet Oral BID   sertraline   100 mg Oral Daily   [START ON 05/04/2024] torsemide  20 mg Oral Daily   Continuous Infusions:   LOS: 4 days   Time spent: 45 mins  Lary Eckardt Vicci, MD How to contact the [facility] Attending or Consulting provider 7A - 7P or covering provider during after hours 7P -7A, for this patient?  Check the care team in [facility] and look for a) attending/consulting TRH provider listed and b) the TRH team listed Log into [email or web address] to find provider on call.  Locate the TRH provider you are looking for under [facility] and page to a number that you can be directly reached. If you still have difficulty reaching the provider, please page the [facility] (Director on Call) for the Hospitalists listed on amion for assistance.  05/03/2024, 12:29 PM    "

## 2024-05-03 NOTE — TOC Progression Note (Addendum)
 Transition of Care [facility]) - Progression Note    Patient Details  Name: EMELYNN RANCE MRN: [id number] Date of Birth: [birth year]  [facility] CM/SW Contact  Hoy DELENA Bigness, LCSW Phone Number: 05/03/2024, 8:34 AM  Clinical Narrative:    Reviewed bed offers w/ pt's daughter. Daughter to review further with family prior to making decision.   Update: Family has accepted offer for STR at [facility]. Insurance currently pending.   [facility] [address] [place], [place] [address] [phone] Overall rating ? [facility] [address] [place], [place] [address] [phone] Overall rating ??   Expected Discharge Plan: Assisted Living Barriers to Discharge: Continued Medical Work up               Expected Discharge Plan and Services In-house Referral: Clinical Social Work Discharge Planning Services: CM Consult Post Acute Care Choice: Resumption of Svcs/PTA Provider Living arrangements for the past 2 months: Assisted Living Facility                                       Social Drivers of Health (SDOH) Interventions SDOH Screenings   Food Insecurity: Patient Unable To Answer (04/29/2024)  Housing: Patient Unable To Answer (04/29/2024)  Transportation Needs: Patient Unable To Answer (04/29/2024)  Utilities: Patient Unable To Answer (04/29/2024)  Social Connections: Patient Unable To Answer (04/29/2024)  Tobacco Use: Low Risk (04/28/2024)    Readmission Risk Interventions    05/01/2024    3:20 PM  Readmission Risk Prevention Plan  Transportation Screening Complete  Home Care Screening Complete  Medication Review (RN CM) Complete

## 2024-05-03 NOTE — Progress Notes (Signed)
 " PROGRESS NOTE   Catherine West  FMW:[id number] DOB: [birth year] DOA: 04/28/2024 PCP: Shona Norleen PEDLAR, MD   Chief Complaint  Patient presents with   Fall   Level of care: Med-Surg  Brief Admission History:  89 y.o. female with medical history significant of HTN, HLD, hypothyroidism, depression, CKD 3,  HFrEF (echo 09/2022 EF 35-40%) who presents to the emergency department due to fall when patient rolled out of bed and sustained a bruise on her forehead causing hematoma.  Oxygen  level was also noted to drop into the 80s on room air, chest x-ray was suspicious for RML pneumonia.    Assessment and Plan:  Acute hypoxic respiratory failure - Initially requiring 2 L nasal cannula to maintain O2 sats in the 90s.  Intermittently room down to room air.   Acute right middle lobe pneumonia - Suspect aspiration pneumonia.  Noted cough and mild hypoxia.  On 05/04/24 start Augmentin x 2 more days to complete course    Hypokalemia - this is being repleted, and it is improving    Acute on chronic HFrEF -she has diuresed well on IV lasix , plan to start oral torsemide 20 mg tomorrow once daily with potassium supplement   AKI on CKD stage 3b  - renal function stabilized now, she likely has stage 3b CKD    Hypertension -  losartan  100 mg daily.   Hypothyroidism/hyperlipidemia/depression - Will resume home medication regiment.  DVT prophylaxis: SCDs Code Status: DNR  Family Communication:  Disposition: awaiting SNF rehab placement    Consultants:   Procedures:   Subjective: No specific complaints today.   Objective: Vitals:   05/03/24 0500 05/03/24 0502 05/03/24 0602 05/03/24 0751  BP:  (!) 128/55 (!) 174/72   Pulse:  69 82   Resp:  18 17   Temp:  98.1 F (36.7 C) 98 F (36.7 C)   TempSrc:  Oral Oral   SpO2:  96% 96% 95%  Weight: 54.5 kg     Height:        Intake/Output Summary (Last 24 hours) at 05/03/2024 1222 Last data filed at 05/03/2024 0014 Gross per 24 hour  Intake  1798.39 ml  Output 850 ml  Net 948.39 ml   Filed Weights   05/01/24 0325 05/02/24 0518 05/03/24 0500  Weight: 55.2 kg 55.7 kg 54.5 kg   Examination:  General exam: frail, elderly female, awake, alert, cooperative, Appears calm and comfortable  Respiratory system:  Respiratory effort normal. Cardiovascular system: normal S1 & S2 heard. No JVD, murmurs, rubs, gallops or clicks. No pedal edema. Gastrointestinal system: Abdomen is nondistended, soft and nontender. No organomegaly or masses felt. Normal bowel sounds heard. Central nervous system: Alert and oriented. No focal neurological deficits. Extremities: Symmetric 5 x 5 power. Skin: No rashes, lesions or ulcers. Psychiatry: Judgement and insight appear normal. Mood & affect appropriate.   Data Reviewed: I have personally reviewed following labs and imaging studies  CBC: Recent Labs  Lab 04/29/24 0011 04/30/24 0354 05/01/24 0431 05/02/24 0425  WBC 22.2* 15.9* 11.8* 11.5*  NEUTROABS 19.3*  --   --   --   HGB 10.8* 11.0* 10.2* 10.4*  HCT 33.4* 34.2* 31.2* 32.1*  MCV 97.7 97.4 98.1 97.6  PLT 247 268 245 241    Basic Metabolic Panel: Recent Labs  Lab 04/29/24 0011 04/30/24 0354 05/01/24 0431 05/02/24 0425 05/03/24 0806  NA 133* 140 144 142 139  K 3.8 3.2* 2.6* 2.9* 3.3*  CL 96* 100 104 100 99  CO2 28 29 33* 34* 26  GLUCOSE 122* 94 98 87 98  BUN 29* 21 20 18 16   CREATININE 1.46* 1.12* 1.10* 1.15* 1.11*  CALCIUM 9.1 8.7* 8.4* 8.7* 8.7*  MG  --  2.1 2.4 1.9 2.0    CBG: No results for input(s): GLUCAP in the last 168 hours.  Recent Results (from the past 240 hours)  Resp panel by RT-PCR (RSV, Flu A&B, Covid) Anterior Nasal Swab     Status: None   Collection Time: 04/29/24 12:05 AM   Specimen: Anterior Nasal Swab  Result Value Ref Range Status   SARS Coronavirus 2 by RT PCR NEGATIVE NEGATIVE Final    Comment: (NOTE) SARS-CoV-2 target nucleic acids are NOT DETECTED.  The SARS-CoV-2 RNA is generally  detectable in upper respiratory specimens during the acute phase of infection. The lowest concentration of SARS-CoV-2 viral copies this assay can detect is 138 copies/mL. A negative result does not preclude SARS-Cov-2 infection and should not be used as the sole basis for treatment or other patient management decisions. A negative result may occur with  improper specimen collection/handling, submission of specimen other than nasopharyngeal swab, presence of viral mutation(s) within the areas targeted by this assay, and inadequate number of viral copies(<138 copies/mL). A negative result must be combined with clinical observations, patient history, and epidemiological information. The expected result is Negative.  Fact Sheet for Patients:  bloggercourse.com  Fact Sheet for Healthcare Providers:  seriousbroker.it  This test is no t yet approved or cleared by the [place]  FDA and  has been authorized for detection and/or diagnosis of SARS-CoV-2 by FDA under an Emergency Use Authorization (EUA). This EUA will remain  in effect (meaning this test can be used) for the duration of the COVID-19 declaration under Section 564(b)(1) of the Act, 21 U.S.C.section 360bbb-3(b)(1), unless the authorization is terminated  or revoked sooner.       Influenza A by PCR NEGATIVE NEGATIVE Final   Influenza B by PCR NEGATIVE NEGATIVE Final    Comment: (NOTE) The Xpert Xpress SARS-CoV-2/FLU/RSV plus assay is intended as an aid in the diagnosis of influenza from Nasopharyngeal swab specimens and should not be used as a sole basis for treatment. Nasal washings and aspirates are unacceptable for Xpert Xpress SARS-CoV-2/FLU/RSV testing.  Fact Sheet for Patients: bloggercourse.com  Fact Sheet for Healthcare Providers: seriousbroker.it  This test is not yet approved or cleared by the [place]  FDA  and has been authorized for detection and/or diagnosis of SARS-CoV-2 by FDA under an Emergency Use Authorization (EUA). This EUA will remain in effect (meaning this test can be used) for the duration of the COVID-19 declaration under Section 564(b)(1) of the Act, 21 U.S.C. section 360bbb-3(b)(1), unless the authorization is terminated or revoked.     Resp Syncytial Virus by PCR NEGATIVE NEGATIVE Final    Comment: (NOTE) Fact Sheet for Patients: bloggercourse.com  Fact Sheet for Healthcare Providers: seriousbroker.it  This test is not yet approved or cleared by the [place]  FDA and has been authorized for detection and/or diagnosis of SARS-CoV-2 by FDA under an Emergency Use Authorization (EUA). This EUA will remain in effect (meaning this test can be used) for the duration of the COVID-19 declaration under Section 564(b)(1) of the Act, 21 U.S.C. section 360bbb-3(b)(1), unless the authorization is terminated or revoked.  Performed at [facility], [address] [place], [place] [address]   Blood culture (routine x 2)     Status: None (Preliminary result)   Collection  Time: 04/29/24 12:58 AM   Specimen: Right Antecubital; Blood  Result Value Ref Range Status   Specimen Description RIGHT ANTECUBITAL  Final   Special Requests   Final    BOTTLES DRAWN AEROBIC AND ANAEROBIC Blood Culture adequate volume   Culture   Final    NO GROWTH 4 DAYS Performed at [facility], [address], [place], [place] [address]    Report Status PENDING  Incomplete  Blood culture (routine x 2)     Status: None (Preliminary result)   Collection Time: 04/29/24  1:01 AM   Specimen: Left Antecubital; Blood  Result Value Ref Range Status   Specimen Description LEFT ANTECUBITAL  Final   Special Requests   Final    BOTTLES DRAWN AEROBIC AND ANAEROBIC Blood Culture adequate volume   Culture   Final    NO GROWTH 4 DAYS Performed at [facility], [address] [place], [place] [address]    Report Status PENDING  Incomplete  Resp panel by RT-PCR (RSV, Flu A&B, Covid) Anterior Nasal Swab     Status: None   Collection Time: 05/02/24 12:37 PM   Specimen: Anterior Nasal Swab  Result Value Ref Range Status   SARS Coronavirus 2 by RT PCR NEGATIVE NEGATIVE Final    Comment: (NOTE) SARS-CoV-2 target nucleic acids are NOT DETECTED.  The SARS-CoV-2 RNA is generally detectable in upper respiratory specimens during the acute phase of infection. The lowest concentration of SARS-CoV-2 viral copies this assay can detect is 138 copies/mL. A negative result does not preclude SARS-Cov-2 infection and should not be used as the sole basis for treatment or other patient management decisions. A negative result may occur with  improper specimen collection/handling, submission of specimen other than nasopharyngeal swab, presence of viral mutation(s) within the areas targeted by this assay, and inadequate number of viral copies(<138 copies/mL). A negative result must be combined with clinical observations, patient history, and epidemiological information. The expected result is Negative.  Fact Sheet for Patients:  bloggercourse.com  Fact Sheet for Healthcare Providers:  seriousbroker.it  This test is no t yet approved or cleared by the [place]  FDA and  has been authorized for detection and/or diagnosis of SARS-CoV-2 by FDA under an Emergency Use Authorization (EUA). This EUA will remain  in effect (meaning this test can be used) for the duration of the COVID-19 declaration under Section 564(b)(1) of the Act, 21 U.S.C.section 360bbb-3(b)(1), unless the authorization is terminated  or revoked sooner.       Influenza A by PCR NEGATIVE NEGATIVE Final   Influenza B by PCR NEGATIVE NEGATIVE Final    Comment: (NOTE) The Xpert Xpress SARS-CoV-2/FLU/RSV plus assay is intended as an aid in  the diagnosis of influenza from Nasopharyngeal swab specimens and should not be used as a sole basis for treatment. Nasal washings and aspirates are unacceptable for Xpert Xpress SARS-CoV-2/FLU/RSV testing.  Fact Sheet for Patients: bloggercourse.com  Fact Sheet for Healthcare Providers: seriousbroker.it  This test is not yet approved or cleared by the [place]  FDA and has been authorized for detection and/or diagnosis of SARS-CoV-2 by FDA under an Emergency Use Authorization (EUA). This EUA will remain in effect (meaning this test can be used) for the duration of the COVID-19 declaration under Section 564(b)(1) of the Act, 21 U.S.C. section 360bbb-3(b)(1), unless the authorization is terminated or revoked.     Resp Syncytial Virus by PCR NEGATIVE NEGATIVE Final    Comment: (NOTE) Fact Sheet for Patients: bloggercourse.com  Fact  Sheet for Healthcare Providers: seriousbroker.it  This test is not yet approved or cleared by the [place]  FDA and has been authorized for detection and/or diagnosis of SARS-CoV-2 by FDA under an Emergency Use Authorization (EUA). This EUA will remain in effect (meaning this test can be used) for the duration of the COVID-19 declaration under Section 564(b)(1) of the Act, 21 U.S.C. section 360bbb-3(b)(1), unless the authorization is terminated or revoked.  Performed at [facility], [address] [place], [place] [address]      Radiology Studies: No results found.  Scheduled Meds:  amLODipine   5 mg Oral Daily   busPIRone   5 mg Oral BID   cholecalciferol   2,000 Units Oral Daily   cyanocobalamin   1,000 mcg Oral Daily   famotidine   20 mg Oral Daily   ferrous sulfate   325 mg Oral Q breakfast   folic acid   1 mg Oral Daily   losartan   100 mg Oral q morning   polyethylene glycol  17 g Oral Daily   potassium chloride   20 mEq Oral BID    senna-docusate  1 tablet Oral BID   sertraline   100 mg Oral Daily   [START ON 05/04/2024] torsemide  20 mg Oral Daily   Continuous Infusions:  ampicillin -sulbactam (UNASYN ) IV 3 g (05/03/24 1003)   azithromycin  Stopped (05/02/24 2357)     LOS: 4 days   Time spent: 55 mins  Carrell Palmatier Vicci, MD How to contact the [facility] Attending or Consulting provider 7A - 7P or covering provider during after hours 7P -7A, for this patient?  Check the care team in [facility] and look for a) attending/consulting [facility] provider listed and b) the [facility] team listed Log into [email or web address] to find provider on call.  Locate the [facility] provider you are looking for under [facility] and page to a number that you can be directly reached. If you still have difficulty reaching the provider, please page the [facility] (Director on Call) for the Hospitalists listed on amion for assistance.  05/03/2024, 12:22 PM    "

## 2024-05-04 DIAGNOSIS — N179 Acute kidney failure, unspecified: Secondary | ICD-10-CM | POA: Diagnosis not present

## 2024-05-04 DIAGNOSIS — J9601 Acute respiratory failure with hypoxia: Secondary | ICD-10-CM | POA: Diagnosis not present

## 2024-05-04 DIAGNOSIS — I5023 Acute on chronic systolic (congestive) heart failure: Secondary | ICD-10-CM | POA: Diagnosis not present

## 2024-05-04 DIAGNOSIS — J690 Pneumonitis due to inhalation of food and vomit: Secondary | ICD-10-CM | POA: Diagnosis not present

## 2024-05-04 LAB — CULTURE, BLOOD (ROUTINE X 2)
Culture: NO GROWTH
Culture: NO GROWTH
Special Requests: ADEQUATE
Special Requests: ADEQUATE

## 2024-05-04 LAB — BASIC METABOLIC PANEL WITH GFR
Anion gap: 6 (ref 5–15)
BUN: 18 mg/dL (ref 8–23)
CO2: 35 mmol/L — ABNORMAL HIGH (ref 22–32)
Calcium: 8.9 mg/dL (ref 8.9–10.3)
Chloride: 98 mmol/L (ref 98–111)
Creatinine, Ser: 1.14 mg/dL — ABNORMAL HIGH (ref 0.44–1.00)
GFR, Estimated: 42 mL/min — ABNORMAL LOW
Glucose, Bld: 86 mg/dL (ref 70–99)
Potassium: 3.7 mmol/L (ref 3.5–5.1)
Sodium: 138 mmol/L (ref 135–145)

## 2024-05-04 NOTE — Progress Notes (Signed)
 Transition of Care [facility]) - Progression Note    Patient Details  Name: Catherine West MRN: [id number] Date of Birth: 1920-07-19  Transition of Care [facility]) CM/SW Contact  Hoy DELENA Bigness, LCSW Phone Number: 05/04/2024, 4:37 PM  Clinical Narrative:    P2P completed. Insurance issuing denial. CSW spoke with pt's daughter to inform of decision. Pt's daughter would like to determine if pt would be able to return to [facility]. VM left for [facility]ina at [facility] to have pt reassessed for return to their facility,    Expected Discharge Plan: Assisted Living Barriers to Discharge: Continued Medical Work up               Expected Discharge Plan and Services In-house Referral: Clinical Social Work Discharge Planning Services: CM Consult Post Acute Care Choice: Resumption of Svcs/PTA Provider Living arrangements for the past 2 months: Assisted Living Facility                                       Social Drivers of Health (SDOH) Interventions SDOH Screenings   Food Insecurity: Patient Unable To Answer (04/29/2024)  Housing: Patient Unable To Answer (04/29/2024)  Transportation Needs: Patient Unable To Answer (04/29/2024)  Utilities: Patient Unable To Answer (04/29/2024)  Social Connections: Patient Unable To Answer (04/29/2024)  Tobacco Use: Low Risk (04/28/2024)    Readmission Risk Interventions    05/01/2024    3:20 PM  Readmission Risk Prevention Plan  Transportation Screening Complete  Home Care Screening Complete  Medication Review (RN CM) Complete

## 2024-05-04 NOTE — Progress Notes (Signed)
 " PROGRESS NOTE   Catherine West  FMW:[id number] DOB: 1920/05/25 DOA: 04/28/2024 PCP: Shona Norleen PEDLAR, MD   Chief Complaint  Patient presents with   Fall   Level of care: Med-Surg  Brief Admission History:  89 y.o. female with medical history significant of HTN, HLD, hypothyroidism, depression, CKD 3,  HFrEF (echo 09/2022 EF 35-40%) who presents to the emergency department due to fall when patient rolled out of bed and sustained a bruise on her forehead causing hematoma.  Oxygen  level was also noted to drop into the 80s on room air, chest x-ray was suspicious for RML pneumonia.    Assessment and Plan:  Acute hypoxic respiratory failure - Initially requiring 2 L nasal cannula to maintain O2 sats in the 90s.  Intermittently room down to room air.   Acute right middle lobe pneumonia - Suspect aspiration pneumonia.  Noted cough and mild hypoxia.  DC unasyn /azithromycin .  Augmentin x 2 more days to complete course    Hypokalemia - this is being repleted, and it is improved   Acute on chronic HFrEF -she has diuresed well on IV lasix , plan to start oral torsemide 20 mg tomorrow once daily with potassium supplement   AKI on CKD stage 3b  - renal function stabilized now, she likely has stage 3b CKD    Hypertension -  losartan  100 mg daily.   Hypothyroidism/hyperlipidemia/depression - Will resume home medication regiment.  DVT prophylaxis: SCDs Code Status: DNR  Family Communication:  Disposition: awaiting SNF rehab placement    Consultants:   Procedures:   Subjective: No specific complaints.     Objective: Vitals:   05/03/24 1331 05/03/24 2122 05/04/24 0357 05/04/24 1351  BP: (!) 145/56 128/66 (!) 140/71 130/64  Pulse: 75 81 75 76  Resp: 18 19 19 16   Temp: 97.8 F (36.6 C) 97.6 F (36.4 C) 97.7 F (36.5 C) 97.6 F (36.4 C)  TempSrc: Oral Oral Oral Oral  SpO2: 98% 96% 97% 96%  Weight:   55.8 kg   Height:        Intake/Output Summary (Last 24 hours) at 05/04/2024  1357 Last data filed at 05/04/2024 1210 Gross per 24 hour  Intake 460 ml  Output 900 ml  Net -440 ml   Filed Weights   05/02/24 0518 05/03/24 0500 05/04/24 0357  Weight: 55.7 kg 54.5 kg 55.8 kg   Examination:  General exam: frail, elderly female, awake, alert, cooperative, Appears calm and comfortable  Respiratory system:  Respiratory effort normal. Cardiovascular system: normal S1 & S2 heard. No JVD, murmurs, rubs, gallops or clicks. No pedal edema. Gastrointestinal system: Abdomen is nondistended, soft and nontender. No organomegaly or masses felt. Normal bowel sounds heard. Central nervous system: Alert and oriented. No focal neurological deficits. Extremities: Symmetric 5 x 5 power. Skin: No rashes, lesions or ulcers. Psychiatry: Judgement and insight appear normal. Mood & affect appropriate.   Data Reviewed: I have personally reviewed following labs and imaging studies  CBC: Recent Labs  Lab 04/29/24 0011 04/30/24 0354 05/01/24 0431 05/02/24 0425  WBC 22.2* 15.9* 11.8* 11.5*  NEUTROABS 19.3*  --   --   --   HGB 10.8* 11.0* 10.2* 10.4*  HCT 33.4* 34.2* 31.2* 32.1*  MCV 97.7 97.4 98.1 97.6  PLT 247 268 245 241    Basic Metabolic Panel: Recent Labs  Lab 04/30/24 0354 05/01/24 0431 05/02/24 0425 05/03/24 0806 05/04/24 0426  NA 140 144 142 139 138  K 3.2* 2.6* 2.9* 3.3* 3.7  CL  100 104 100 99 98  CO2 29 33* 34* 26 35*  GLUCOSE 94 98 87 98 86  BUN 21 20 18 16 18   CREATININE 1.12* 1.10* 1.15* 1.11* 1.14*  CALCIUM 8.7* 8.4* 8.7* 8.7* 8.9  MG 2.1 2.4 1.9 2.0  --    CBG: No results for input(s): GLUCAP in the last 168 hours.  Recent Results (from the past 240 hours)  Resp panel by RT-PCR (RSV, Flu A&B, Covid) Anterior Nasal Swab     Status: None   Collection Time: 04/29/24 12:05 AM   Specimen: Anterior Nasal Swab  Result Value Ref Range Status   SARS Coronavirus 2 by RT PCR NEGATIVE NEGATIVE Final    Comment: (NOTE) SARS-CoV-2 target nucleic acids are NOT  DETECTED.  The SARS-CoV-2 RNA is generally detectable in upper respiratory specimens during the acute phase of infection. The lowest concentration of SARS-CoV-2 viral copies this assay can detect is 138 copies/mL. A negative result does not preclude SARS-Cov-2 infection and should not be used as the sole basis for treatment or other patient management decisions. A negative result may occur with  improper specimen collection/handling, submission of specimen other than nasopharyngeal swab, presence of viral mutation(s) within the areas targeted by this assay, and inadequate number of viral copies(<138 copies/mL). A negative result must be combined with clinical observations, patient history, and epidemiological information. The expected result is Negative.  Fact Sheet for Patients:  bloggercourse.com  Fact Sheet for Healthcare Providers:  seriousbroker.it  This test is no t yet approved or cleared by the [place]  FDA and  has been authorized for detection and/or diagnosis of SARS-CoV-2 by FDA under an Emergency Use Authorization (EUA). This EUA will remain  in effect (meaning this test can be used) for the duration of the COVID-19 declaration under Section 564(b)(1) of the Act, 21 U.S.C.section 360bbb-3(b)(1), unless the authorization is terminated  or revoked sooner.       Influenza A by PCR NEGATIVE NEGATIVE Final   Influenza B by PCR NEGATIVE NEGATIVE Final    Comment: (NOTE) The Xpert Xpress SARS-CoV-2/FLU/RSV plus assay is intended as an aid in the diagnosis of influenza from Nasopharyngeal swab specimens and should not be used as a sole basis for treatment. Nasal washings and aspirates are unacceptable for Xpert Xpress SARS-CoV-2/FLU/RSV testing.  Fact Sheet for Patients: bloggercourse.com  Fact Sheet for Healthcare Providers: seriousbroker.it  This test is not yet  approved or cleared by the [place]  FDA and has been authorized for detection and/or diagnosis of SARS-CoV-2 by FDA under an Emergency Use Authorization (EUA). This EUA will remain in effect (meaning this test can be used) for the duration of the COVID-19 declaration under Section 564(b)(1) of the Act, 21 U.S.C. section 360bbb-3(b)(1), unless the authorization is terminated or revoked.     Resp Syncytial Virus by PCR NEGATIVE NEGATIVE Final    Comment: (NOTE) Fact Sheet for Patients: bloggercourse.com  Fact Sheet for Healthcare Providers: seriousbroker.it  This test is not yet approved or cleared by the [place]  FDA and has been authorized for detection and/or diagnosis of SARS-CoV-2 by FDA under an Emergency Use Authorization (EUA). This EUA will remain in effect (meaning this test can be used) for the duration of the COVID-19 declaration under Section 564(b)(1) of the Act, 21 U.S.C. section 360bbb-3(b)(1), unless the authorization is terminated or revoked.  Performed at [facility], [address] [place], [place] [address]   Blood culture (routine x 2)     Status: None  Collection Time: 04/29/24 12:58 AM   Specimen: Right Antecubital; Blood  Result Value Ref Range Status   Specimen Description RIGHT ANTECUBITAL  Final   Special Requests   Final    BOTTLES DRAWN AEROBIC AND ANAEROBIC Blood Culture adequate volume   Culture   Final    NO GROWTH 5 DAYS Performed at [facility], [address] [place], [place] [address]    Report Status 05/04/2024 FINAL  Final  Blood culture (routine x 2)     Status: None   Collection Time: 04/29/24  1:01 AM   Specimen: Left Antecubital; Blood  Result Value Ref Range Status   Specimen Description LEFT ANTECUBITAL  Final   Special Requests   Final    BOTTLES DRAWN AEROBIC AND ANAEROBIC Blood Culture adequate volume   Culture   Final    NO GROWTH 5 DAYS Performed at [facility], [address] [place], [place] [address]    Report Status 05/04/2024 FINAL  Final  Resp panel by RT-PCR (RSV, Flu A&B, Covid) Anterior Nasal Swab     Status: None   Collection Time: 05/02/24 12:37 PM   Specimen: Anterior Nasal Swab  Result Value Ref Range Status   SARS Coronavirus 2 by RT PCR NEGATIVE NEGATIVE Final    Comment: (NOTE) SARS-CoV-2 target nucleic acids are NOT DETECTED.  The SARS-CoV-2 RNA is generally detectable in upper respiratory specimens during the acute phase of infection. The lowest concentration of SARS-CoV-2 viral copies this assay can detect is 138 copies/mL. A negative result does not preclude SARS-Cov-2 infection and should not be used as the sole basis for treatment or other patient management decisions. A negative result may occur with  improper specimen collection/handling, submission of specimen other than nasopharyngeal swab, presence of viral mutation(s) within the areas targeted by this assay, and inadequate number of viral copies(<138 copies/mL). A negative result must be combined with clinical observations, patient history, and epidemiological information. The expected result is Negative.  Fact Sheet for Patients:  bloggercourse.com  Fact Sheet for Healthcare Providers:  seriousbroker.it  This test is no t yet approved or cleared by the [place]  FDA and  has been authorized for detection and/or diagnosis of SARS-CoV-2 by FDA under an Emergency Use Authorization (EUA). This EUA will remain  in effect (meaning this test can be used) for the duration of the COVID-19 declaration under Section 564(b)(1) of the Act, 21 U.S.C.section 360bbb-3(b)(1), unless the authorization is terminated  or revoked sooner.       Influenza A by PCR NEGATIVE NEGATIVE Final   Influenza B by PCR NEGATIVE NEGATIVE Final    Comment: (NOTE) The Xpert Xpress SARS-CoV-2/FLU/RSV plus assay is intended as an  aid in the diagnosis of influenza from Nasopharyngeal swab specimens and should not be used as a sole basis for treatment. Nasal washings and aspirates are unacceptable for Xpert Xpress SARS-CoV-2/FLU/RSV testing.  Fact Sheet for Patients: bloggercourse.com  Fact Sheet for Healthcare Providers: seriousbroker.it  This test is not yet approved or cleared by the [place]  FDA and has been authorized for detection and/or diagnosis of SARS-CoV-2 by FDA under an Emergency Use Authorization (EUA). This EUA will remain in effect (meaning this test can be used) for the duration of the COVID-19 declaration under Section 564(b)(1) of the Act, 21 U.S.C. section 360bbb-3(b)(1), unless the authorization is terminated or revoked.     Resp Syncytial Virus by PCR NEGATIVE NEGATIVE Final    Comment: (NOTE) Fact Sheet for Patients: bloggercourse.com  Fact Sheet for Healthcare Providers: seriousbroker.it  This test is not yet approved or cleared by the [place]  FDA and has been authorized for detection and/or diagnosis of SARS-CoV-2 by FDA under an Emergency Use Authorization (EUA). This EUA will remain in effect (meaning this test can be used) for the duration of the COVID-19 declaration under Section 564(b)(1) of the Act, 21 U.S.C. section 360bbb-3(b)(1), unless the authorization is terminated or revoked.  Performed at [facility], [address], [place], [place] [address]      Radiology Studies: No results found.  Scheduled Meds:  amLODipine   5 mg Oral Daily   amoxicillin-clavulanate  1 tablet Oral BID   busPIRone   5 mg Oral BID   cholecalciferol   2,000 Units Oral Daily   cyanocobalamin   1,000 mcg Oral Daily   famotidine   20 mg Oral Daily   ferrous sulfate   325 mg Oral Q breakfast   folic acid   1 mg Oral Daily   losartan   100 mg Oral q morning   polyethylene glycol  17 g  Oral Daily   senna-docusate  1 tablet Oral BID   sertraline   100 mg Oral Daily   torsemide  20 mg Oral Daily   Continuous Infusions:   LOS: 5 days   Time spent: 40 mins  Graceanne Guin Vicci, MD How to contact the [facility] Attending or Consulting provider 7A - 7P or covering provider during after hours 7P -7A, for this patient?  Check the care team in [facility] and look for a) attending/consulting TRH provider listed and b) the TRH team listed Log into [email or web address] to find provider on call.  Locate the TRH provider you are looking for under [facility] and page to a number that you can be directly reached. If you still have difficulty reaching the provider, please page the [facility] (Director on Call) for the Hospitalists listed on amion for assistance.  05/04/2024, 1:57 PM    "

## 2024-05-04 NOTE — Plan of Care (Signed)

## 2024-05-04 NOTE — TOC Progression Note (Signed)
 Transition of Care [facility]) - Progression Note    Patient Details  Name: Catherine West MRN: [id number] Date of Birth: 02/05/1921  [facility] CM/SW Contact  Hoy DELENA Bigness, LCSW Phone Number: 05/04/2024, 3:51 PM  Clinical Narrative:    Pt's insurance offering a P2P due by Monday 05/07/24 at 11am. MD to call in to [phone] option 5. MD notified.    Expected Discharge Plan: Assisted Living Barriers to Discharge: Continued Medical Work up               Expected Discharge Plan and Services In-house Referral: Clinical Social Work Discharge Planning Services: CM Consult Post Acute Care Choice: Resumption of Svcs/PTA Provider Living arrangements for the past 2 months: Assisted Living Facility                                       Social Drivers of Health (SDOH) Interventions SDOH Screenings   Food Insecurity: Patient Unable To Answer (04/29/2024)  Housing: Patient Unable To Answer (04/29/2024)  Transportation Needs: Patient Unable To Answer (04/29/2024)  Utilities: Patient Unable To Answer (04/29/2024)  Social Connections: Patient Unable To Answer (04/29/2024)  Tobacco Use: Low Risk (04/28/2024)    Readmission Risk Interventions    05/01/2024    3:20 PM  Readmission Risk Prevention Plan  Transportation Screening Complete  Home Care Screening Complete  Medication Review (RN CM) Complete

## 2024-05-05 DIAGNOSIS — I5023 Acute on chronic systolic (congestive) heart failure: Secondary | ICD-10-CM | POA: Diagnosis not present

## 2024-05-05 DIAGNOSIS — J9601 Acute respiratory failure with hypoxia: Secondary | ICD-10-CM | POA: Diagnosis not present

## 2024-05-05 DIAGNOSIS — J690 Pneumonitis due to inhalation of food and vomit: Secondary | ICD-10-CM | POA: Diagnosis not present

## 2024-05-05 DIAGNOSIS — N179 Acute kidney failure, unspecified: Secondary | ICD-10-CM | POA: Diagnosis not present

## 2024-05-05 NOTE — Progress Notes (Signed)
" °   05/05/24 1223  Assess: MEWS Score  Temp 98.1 F (36.7 C)  BP 127/72  MAP (mmHg) 85  Pulse Rate 80  Resp (!) 31  SpO2 94 %  O2 Device Nasal Cannula  Assess: MEWS Score  MEWS Temp 0  MEWS Systolic 0  MEWS Pulse 0  MEWS RR 2  MEWS LOC 0  MEWS Score 2  MEWS Score Color Yellow  Assess: if the MEWS score is Yellow or Red  Were vital signs accurate and taken at a resting state? Yes  Does the patient meet 2 or more of the SIRS criteria? No  MEWS guidelines implemented  Yes, yellow  Treat  MEWS Interventions Considered administering scheduled or prn medications/treatments as ordered  Take Vital Signs  Increase Vital Sign Frequency  Yellow: Q2hr x1, continue Q4hrs until patient remains green for 12hrs  Escalate  MEWS: Escalate Yellow: Discuss with charge nurse and consider notifying provider and/or RRT  Notify: Charge Nurse/RN  Name of Charge Nurse/RN Notified [email or web address]  Provider Notification  Provider Name/Title Dr. Vicci  Date Provider Notified 05/05/24  Method of Notification Page  Notification Reason Other (Comment) (yellow mews)  Provider response No new orders  Assess: SIRS CRITERIA  SIRS Temperature  0  SIRS Respirations  1  SIRS Pulse 0  SIRS WBC 0  SIRS Score Sum  1    "

## 2024-05-05 NOTE — Plan of Care (Signed)
   Problem: Nutrition: Goal: Adequate nutrition will be maintained Outcome: Progressing   Problem: Coping: Goal: Level of anxiety will decrease Outcome: Progressing

## 2024-05-05 NOTE — Plan of Care (Signed)
" °  Problem: Clinical Measurements: Goal: Respiratory complications will improve Outcome: Progressing Goal: Cardiovascular complication will be avoided Outcome: Progressing   Problem: Coping: Goal: Level of anxiety will decrease Outcome: Progressing   Problem: Elimination: Goal: Will not experience complications related to urinary retention Outcome: Progressing   Problem: Pain Managment: Goal: General experience of comfort will improve and/or be controlled Outcome: Progressing   Problem: Nutrition: Goal: Adequate nutrition will be maintained Outcome: Not Progressing   "

## 2024-05-05 NOTE — Plan of Care (Signed)
   Problem: Education: Goal: Knowledge of General Education information will improve Description Including pain rating scale, medication(s)/side effects and non-pharmacologic comfort measures Outcome: Progressing   Problem: Health Behavior/Discharge Planning: Goal: Ability to manage health-related needs will improve Outcome: Progressing

## 2024-05-05 NOTE — Progress Notes (Signed)
 " PROGRESS NOTE   [name]  FMW:[id number] DOB: 28-Jun-1920 DOA: 04/28/2024 PCP: Shona Norleen PEDLAR, MD   Chief Complaint  Patient presents with   Fall   Level of care: Med-Surg  Brief Admission History:  89 y.o. female with medical history significant of HTN, HLD, hypothyroidism, depression, CKD 3,  HFrEF (echo 09/2022 EF 35-40%) who presents to the emergency department due to fall when patient rolled out of bed and sustained a bruise on her forehead causing hematoma.  Oxygen  level was also noted to drop into the 80s on room air, chest x-ray was suspicious for RML pneumonia.    Assessment and Plan:  Acute hypoxic respiratory failure - Initially requiring 2 L nasal cannula to maintain O2 sats in the 90s.  Intermittently room down to room air.   Acute right middle lobe pneumonia - Suspect aspiration pneumonia.  Noted cough and mild hypoxia.  DC unasyn /azithromycin .  Augmentin  x 2 more days to complete course thru 05/05/24    Hypokalemia - this is being repleted, and it is improved   Acute on chronic HFrEF -she has diuresed well on IV lasix , plan to start oral torsemide  20 mg tomorrow once daily with potassium supplement  Leukocytosis -- secondary to pneumonia -- WBC trending down with treatments   AKI on CKD stage 3b  - renal function stabilized now, she likely has stage 3b CKD    Dysphagia -- requested SLP evaluation -- started dysphagia 3 diet   Hypertension -  losartan  100 mg daily and amlodipine  5 mg.   Hypothyroidism/hyperlipidemia/depression - Will resume home medication regiment.  DVT prophylaxis: SCDs Code Status: DNR  Family Communication:  Disposition: declined by insurance for SNF placement, peer to peer completed 05/04/24 and declined, insurance feels like she does not have rehab potential and final denial issued 05/04/24. TOC working with a back up plan.       Consultants:   Procedures:   Subjective: No complaints but having some difficulty with swallowing  regular diet.       Objective: Vitals:   05/04/24 1351 05/04/24 2042 05/05/24 0426 05/05/24 0818  BP: 130/64 (!) 142/68 (!) 148/66 136/82  Pulse: 76 84 84 86  Resp: 16 18 18    Temp: 97.6 F (36.4 C) 98.7 F (37.1 C) 98.8 F (37.1 C)   TempSrc: Oral Oral Oral   SpO2: 96% 100% 97%   Weight:   56.7 kg   Height:        Intake/Output Summary (Last 24 hours) at 05/05/2024 9078 Last data filed at 05/05/2024 0831 Gross per 24 hour  Intake 240 ml  Output 200 ml  Net 40 ml   Filed Weights   05/03/24 0500 05/04/24 0357 05/05/24 0426  Weight: 54.5 kg 55.8 kg 56.7 kg   Examination:  General exam: frail, elderly female, awake, alert, cooperative, Appears calm and comfortable  Respiratory system:  Respiratory effort normal. Cardiovascular system: normal S1 & S2 heard. No JVD, murmurs, rubs, gallops or clicks. No pedal edema. Gastrointestinal system: Abdomen is nondistended, soft and nontender. No organomegaly or masses felt. Normal bowel sounds heard. Central nervous system: Alert and oriented. No focal neurological deficits. Extremities: Symmetric 5 x 5 power. Skin: No rashes, lesions or ulcers. Psychiatry: Judgement and insight appear normal. Mood & affect appropriate.   Data Reviewed: I have personally reviewed following labs and imaging studies  CBC: Recent Labs  Lab 04/29/24 0011 04/30/24 0354 05/01/24 0431 05/02/24 0425  WBC 22.2* 15.9* 11.8* 11.5*  NEUTROABS 19.3*  --   --   --  HGB 10.8* 11.0* 10.2* 10.4*  HCT 33.4* 34.2* 31.2* 32.1*  MCV 97.7 97.4 98.1 97.6  PLT 247 268 245 241    Basic Metabolic Panel: Recent Labs  Lab 04/30/24 0354 05/01/24 0431 05/02/24 0425 05/03/24 0806 05/04/24 0426  NA 140 144 142 139 138  K 3.2* 2.6* 2.9* 3.3* 3.7  CL 100 104 100 99 98  CO2 29 33* 34* 26 35*  GLUCOSE 94 98 87 98 86  BUN 21 20 18 16 18   CREATININE 1.12* 1.10* 1.15* 1.11* 1.14*  CALCIUM 8.7* 8.4* 8.7* 8.7* 8.9  MG 2.1 2.4 1.9 2.0  --    CBG: No results for  input(s): GLUCAP in the last 168 hours.  Recent Results (from the past 240 hours)  Resp panel by RT-PCR (RSV, Flu A&B, Covid) Anterior Nasal Swab     Status: None   Collection Time: 04/29/24 12:05 AM   Specimen: Anterior Nasal Swab  Result Value Ref Range Status   SARS Coronavirus 2 by RT PCR NEGATIVE NEGATIVE Final    Comment: (NOTE) SARS-CoV-2 target nucleic acids are NOT DETECTED.  The SARS-CoV-2 RNA is generally detectable in upper respiratory specimens during the acute phase of infection. The lowest concentration of SARS-CoV-2 viral copies this assay can detect is 138 copies/mL. A negative result does not preclude SARS-Cov-2 infection and should not be used as the sole basis for treatment or other patient management decisions. A negative result may occur with  improper specimen collection/handling, submission of specimen other than nasopharyngeal swab, presence of viral mutation(s) within the areas targeted by this assay, and inadequate number of viral copies(<138 copies/mL). A negative result must be combined with clinical observations, patient history, and epidemiological information. The expected result is Negative.  Fact Sheet for Patients:  bloggercourse.com  Fact Sheet for Healthcare Providers:  seriousbroker.it  This test is no t yet approved or cleared by the [place]  FDA and  has been authorized for detection and/or diagnosis of SARS-CoV-2 by FDA under an Emergency Use Authorization (EUA). This EUA will remain  in effect (meaning this test can be used) for the duration of the COVID-19 declaration under Section 564(b)(1) of the Act, 21 U.S.C.section 360bbb-3(b)(1), unless the authorization is terminated  or revoked sooner.       Influenza A by PCR NEGATIVE NEGATIVE Final   Influenza B by PCR NEGATIVE NEGATIVE Final    Comment: (NOTE) The Xpert Xpress SARS-CoV-2/FLU/RSV plus assay is intended as an aid in  the diagnosis of influenza from Nasopharyngeal swab specimens and should not be used as a sole basis for treatment. Nasal washings and aspirates are unacceptable for Xpert Xpress SARS-CoV-2/FLU/RSV testing.  Fact Sheet for Patients: bloggercourse.com  Fact Sheet for Healthcare Providers: seriousbroker.it  This test is not yet approved or cleared by the [place]  FDA and has been authorized for detection and/or diagnosis of SARS-CoV-2 by FDA under an Emergency Use Authorization (EUA). This EUA will remain in effect (meaning this test can be used) for the duration of the COVID-19 declaration under Section 564(b)(1) of the Act, 21 U.S.C. section 360bbb-3(b)(1), unless the authorization is terminated or revoked.     Resp Syncytial Virus by PCR NEGATIVE NEGATIVE Final    Comment: (NOTE) Fact Sheet for Patients: bloggercourse.com  Fact Sheet for Healthcare Providers: seriousbroker.it  This test is not yet approved or cleared by the [place]  FDA and has been authorized for detection and/or diagnosis of SARS-CoV-2 by FDA under an Emergency Use Authorization (EUA). This EUA  will remain in effect (meaning this test can be used) for the duration of the COVID-19 declaration under Section 564(b)(1) of the Act, 21 U.S.C. section 360bbb-3(b)(1), unless the authorization is terminated or revoked.  Performed at [facility], [address] [place], [place] [address]   Blood culture (routine x 2)     Status: None   Collection Time: 04/29/24 12:58 AM   Specimen: Right Antecubital; Blood  Result Value Ref Range Status   Specimen Description RIGHT ANTECUBITAL  Final   Special Requests   Final    BOTTLES DRAWN AEROBIC AND ANAEROBIC Blood Culture adequate volume   Culture   Final    NO GROWTH 5 DAYS Performed at [facility], [address], [place], [place] [address]    Report Status  05/04/2024 FINAL  Final  Blood culture (routine x 2)     Status: None   Collection Time: 04/29/24  1:01 AM   Specimen: Left Antecubital; Blood  Result Value Ref Range Status   Specimen Description LEFT ANTECUBITAL  Final   Special Requests   Final    BOTTLES DRAWN AEROBIC AND ANAEROBIC Blood Culture adequate volume   Culture   Final    NO GROWTH 5 DAYS Performed at [facility], [address] [place], [place] [address]    Report Status 05/04/2024 FINAL  Final  Resp panel by RT-PCR (RSV, Flu A&B, Covid) Anterior Nasal Swab     Status: None   Collection Time: 05/02/24 12:37 PM   Specimen: Anterior Nasal Swab  Result Value Ref Range Status   SARS Coronavirus 2 by RT PCR NEGATIVE NEGATIVE Final    Comment: (NOTE) SARS-CoV-2 target nucleic acids are NOT DETECTED.  The SARS-CoV-2 RNA is generally detectable in upper respiratory specimens during the acute phase of infection. The lowest concentration of SARS-CoV-2 viral copies this assay can detect is 138 copies/mL. A negative result does not preclude SARS-Cov-2 infection and should not be used as the sole basis for treatment or other patient management decisions. A negative result may occur with  improper specimen collection/handling, submission of specimen other than nasopharyngeal swab, presence of viral mutation(s) within the areas targeted by this assay, and inadequate number of viral copies(<138 copies/mL). A negative result must be combined with clinical observations, patient history, and epidemiological information. The expected result is Negative.  Fact Sheet for Patients:  bloggercourse.com  Fact Sheet for Healthcare Providers:  seriousbroker.it  This test is no t yet approved or cleared by the [place]  FDA and  has been authorized for detection and/or diagnosis of SARS-CoV-2 by FDA under an Emergency Use Authorization (EUA). This EUA will remain  in effect (meaning  this test can be used) for the duration of the COVID-19 declaration under Section 564(b)(1) of the Act, 21 U.S.C.section 360bbb-3(b)(1), unless the authorization is terminated  or revoked sooner.       Influenza A by PCR NEGATIVE NEGATIVE Final   Influenza B by PCR NEGATIVE NEGATIVE Final    Comment: (NOTE) The Xpert Xpress SARS-CoV-2/FLU/RSV plus assay is intended as an aid in the diagnosis of influenza from Nasopharyngeal swab specimens and should not be used as a sole basis for treatment. Nasal washings and aspirates are unacceptable for Xpert Xpress SARS-CoV-2/FLU/RSV testing.  Fact Sheet for Patients: bloggercourse.com  Fact Sheet for Healthcare Providers: seriousbroker.it  This test is not yet approved or cleared by the [place]  FDA and has been authorized for detection and/or diagnosis of SARS-CoV-2 by FDA under an Emergency Use  Authorization (EUA). This EUA will remain in effect (meaning this test can be used) for the duration of the COVID-19 declaration under Section 564(b)(1) of the Act, 21 U.S.C. section 360bbb-3(b)(1), unless the authorization is terminated or revoked.     Resp Syncytial Virus by PCR NEGATIVE NEGATIVE Final    Comment: (NOTE) Fact Sheet for Patients: bloggercourse.com  Fact Sheet for Healthcare Providers: seriousbroker.it  This test is not yet approved or cleared by the [place]  FDA and has been authorized for detection and/or diagnosis of SARS-CoV-2 by FDA under an Emergency Use Authorization (EUA). This EUA will remain in effect (meaning this test can be used) for the duration of the COVID-19 declaration under Section 564(b)(1) of the Act, 21 U.S.C. section 360bbb-3(b)(1), unless the authorization is terminated or revoked.  Performed at [facility], [address], [place], [place] [address]      Radiology Studies: No results  found.  Scheduled Meds:  amLODipine   5 mg Oral Daily   amoxicillin -clavulanate  1 tablet Oral BID   busPIRone   5 mg Oral BID   cholecalciferol   2,000 Units Oral Daily   cyanocobalamin   1,000 mcg Oral Daily   famotidine   20 mg Oral Daily   ferrous sulfate   325 mg Oral Q breakfast   folic acid   1 mg Oral Daily   losartan   100 mg Oral q morning   polyethylene glycol  17 g Oral Daily   senna-docusate  1 tablet Oral BID   sertraline   100 mg Oral Daily   torsemide   20 mg Oral Daily   Continuous Infusions:   LOS: 6 days   Time spent: 46 mins  Zebulen Simonis Vicci, MD How to contact the [facility] Attending or Consulting provider 7A - 7P or covering provider during after hours 7P -7A, for this patient?  Check the care team in [facility] and look for a) attending/consulting [facility] provider listed and b) the [facility] team listed Log into [email or web address] to find provider on call.  Locate the [facility] provider you are looking for under [facility] and page to a number that you can be directly reached. If you still have difficulty reaching the provider, please page the [facility] (Director on Call) for the Hospitalists listed on amion for assistance.  05/05/2024, 9:21 AM    "

## 2024-05-06 DIAGNOSIS — N179 Acute kidney failure, unspecified: Secondary | ICD-10-CM | POA: Diagnosis not present

## 2024-05-06 DIAGNOSIS — J9601 Acute respiratory failure with hypoxia: Secondary | ICD-10-CM | POA: Diagnosis not present

## 2024-05-06 DIAGNOSIS — J690 Pneumonitis due to inhalation of food and vomit: Secondary | ICD-10-CM | POA: Diagnosis not present

## 2024-05-06 DIAGNOSIS — I5023 Acute on chronic systolic (congestive) heart failure: Secondary | ICD-10-CM | POA: Diagnosis not present

## 2024-05-06 LAB — BASIC METABOLIC PANEL WITH GFR
Anion gap: 8 (ref 5–15)
BUN: 29 mg/dL — ABNORMAL HIGH (ref 8–23)
CO2: 35 mmol/L — ABNORMAL HIGH (ref 22–32)
Calcium: 8.5 mg/dL — ABNORMAL LOW (ref 8.9–10.3)
Chloride: 100 mmol/L (ref 98–111)
Creatinine, Ser: 1.47 mg/dL — ABNORMAL HIGH (ref 0.44–1.00)
GFR, Estimated: 31 mL/min — ABNORMAL LOW
Glucose, Bld: 87 mg/dL (ref 70–99)
Potassium: 3.3 mmol/L — ABNORMAL LOW (ref 3.5–5.1)
Sodium: 142 mmol/L (ref 135–145)

## 2024-05-06 MED ORDER — POTASSIUM CHLORIDE 20 MEQ PO PACK
40.0000 meq | PACK | Freq: Once | ORAL | Status: AC
  Administered 2024-05-06: 40 meq via ORAL
  Filled 2024-05-06: qty 2

## 2024-05-06 MED ORDER — SODIUM CHLORIDE 0.9 % IV BOLUS
500.0000 mL | Freq: Once | INTRAVENOUS | Status: AC
  Administered 2024-05-06: 500 mL via INTRAVENOUS

## 2024-05-06 NOTE — Progress Notes (Signed)
 " PROGRESS NOTE   Catherine West  FMW:[id number] DOB: [birth year] DOA: 04/28/2024 PCP: Shona Norleen PEDLAR, MD   Chief Complaint  Patient presents with   Fall   Level of care: Med-Surg  Brief Admission History:  89 y.o. female with medical history significant of HTN, HLD, hypothyroidism, depression, CKD 3,  HFrEF (echo 09/2022 EF 35-40%) who presents to the emergency department due to fall when patient rolled out of bed and sustained a bruise on her forehead causing hematoma.  Oxygen  level was also noted to drop into the 80s on room air, chest x-ray was suspicious for RML pneumonia.    Assessment and Plan:  Acute hypoxic respiratory failure - Initially requiring 2 L nasal cannula to maintain O2 sats in the 90s.  Intermittently room down to room air.   Acute right middle lobe pneumonia - Suspect aspiration pneumonia.  Noted cough and mild hypoxia.  DC unasyn /azithromycin .  Augmentin  x 2 more days to complete course thru 05/05/24    Hypokalemia - this is being repleted, and it is improved   Acute on chronic HFrEF -she has diuresed well on IV lasix , hold torsemide  today due to dehydration  Leukocytosis -- secondary to pneumonia -- WBC trending down with treatments   AKI on CKD stage 3b  - renal function stabilized now, she likely has stage 3b CKD  -- creatinine bumped today, likely from dehydration, hold diuretic, -- gently hydrate today.    Dysphagia -- requested SLP evaluation -- started dysphagia 3 diet   Hypertension -  losartan  100 mg daily and amlodipine  5 mg.   Hypothyroidism/hyperlipidemia/depression - Will resume home medication regiment.  DVT prophylaxis: SCDs Code Status: DNR  Family Communication:  Disposition: declined by insurance for SNF placement, peer to peer completed 05/04/24 and declined, insurance feels like she does not have rehab potential and final denial issued 05/04/24. TOC working with a back up plan.       Consultants:   Procedures:   Subjective: No  specific complaints today.       Objective: Vitals:   05/05/24 1820 05/05/24 2018 05/06/24 0509 05/06/24 0721  BP: 134/69 139/86 138/73 (!) 115/55  Pulse: 77 80 77   Resp: (!) 30 (!) 22 17   Temp: 99.7 F (37.6 C) 98.1 F (36.7 C) 98.3 F (36.8 C)   TempSrc: Axillary Axillary Axillary   SpO2: 94% (!) 84% 96%   Weight:   50.5 kg   Height:        Intake/Output Summary (Last 24 hours) at 05/06/2024 1140 Last data filed at 05/06/2024 9095 Gross per 24 hour  Intake 90 ml  Output 100 ml  Net -10 ml   Filed Weights   05/04/24 0357 05/05/24 0426 05/06/24 0509  Weight: 55.8 kg 56.7 kg 50.5 kg   Examination:  General exam: dry mucus membranes, frail, elderly female, awake, alert, cooperative, Appears calm and comfortable  Respiratory system:  Respiratory effort normal. Cardiovascular system: normal S1 & S2 heard. No JVD, murmurs, rubs, gallops or clicks. No pedal edema. Gastrointestinal system: Abdomen is nondistended, soft and nontender. No organomegaly or masses felt. Normal bowel sounds heard. Central nervous system: Alert and oriented. No focal neurological deficits. Extremities: Symmetric 5 x 5 power. Skin: No rashes, lesions or ulcers. Psychiatry: Judgement and insight appear normal. Mood & affect appropriate.   Data Reviewed: I have personally reviewed following labs and imaging studies  CBC: Recent Labs  Lab 04/30/24 0354 05/01/24 0431 05/02/24 0425  WBC 15.9* 11.8* 11.5*  HGB 11.0* 10.2* 10.4*  HCT 34.2* 31.2* 32.1*  MCV 97.4 98.1 97.6  PLT 268 245 241    Basic Metabolic Panel: Recent Labs  Lab 04/30/24 0354 05/01/24 0431 05/02/24 0425 05/03/24 0806 05/04/24 0426 05/06/24 0436  NA 140 144 142 139 138 142  K 3.2* 2.6* 2.9* 3.3* 3.7 3.3*  CL 100 104 100 99 98 100  CO2 29 33* 34* 26 35* 35*  GLUCOSE 94 98 87 98 86 87  BUN 21 20 18 16 18  29*  CREATININE 1.12* 1.10* 1.15* 1.11* 1.14* 1.47*  CALCIUM 8.7* 8.4* 8.7* 8.7* 8.9 8.5*  MG 2.1 2.4 1.9 2.0  --   --     CBG: No results for input(s): GLUCAP in the last 168 hours.  Recent Results (from the past 240 hours)  Resp panel by RT-PCR (RSV, Flu A&B, Covid) Anterior Nasal Swab     Status: None   Collection Time: 04/29/24 12:05 AM   Specimen: Anterior Nasal Swab  Result Value Ref Range Status   SARS Coronavirus 2 by RT PCR NEGATIVE NEGATIVE Final    Comment: (NOTE) SARS-CoV-2 target nucleic acids are NOT DETECTED.  The SARS-CoV-2 RNA is generally detectable in upper respiratory specimens during the acute phase of infection. The lowest concentration of SARS-CoV-2 viral copies this assay can detect is 138 copies/mL. A negative result does not preclude SARS-Cov-2 infection and should not be used as the sole basis for treatment or other patient management decisions. A negative result may occur with  improper specimen collection/handling, submission of specimen other than nasopharyngeal swab, presence of viral mutation(s) within the areas targeted by this assay, and inadequate number of viral copies(<138 copies/mL). A negative result must be combined with clinical observations, patient history, and epidemiological information. The expected result is Negative.  Fact Sheet for Patients:  bloggercourse.com  Fact Sheet for Healthcare Providers:  seriousbroker.it  This test is no t yet approved or cleared by the [place]  FDA and  has been authorized for detection and/or diagnosis of SARS-CoV-2 by FDA under an Emergency Use Authorization (EUA). This EUA will remain  in effect (meaning this test can be used) for the duration of the COVID-19 declaration under Section 564(b)(1) of the Act, 21 U.S.C.section 360bbb-3(b)(1), unless the authorization is terminated  or revoked sooner.       Influenza A by PCR NEGATIVE NEGATIVE Final   Influenza B by PCR NEGATIVE NEGATIVE Final    Comment: (NOTE) The Xpert Xpress SARS-CoV-2/FLU/RSV plus assay  is intended as an aid in the diagnosis of influenza from Nasopharyngeal swab specimens and should not be used as a sole basis for treatment. Nasal washings and aspirates are unacceptable for Xpert Xpress SARS-CoV-2/FLU/RSV testing.  Fact Sheet for Patients: bloggercourse.com  Fact Sheet for Healthcare Providers: seriousbroker.it  This test is not yet approved or cleared by the [place]  FDA and has been authorized for detection and/or diagnosis of SARS-CoV-2 by FDA under an Emergency Use Authorization (EUA). This EUA will remain in effect (meaning this test can be used) for the duration of the COVID-19 declaration under Section 564(b)(1) of the Act, 21 U.S.C. section 360bbb-3(b)(1), unless the authorization is terminated or revoked.     Resp Syncytial Virus by PCR NEGATIVE NEGATIVE Final    Comment: (NOTE) Fact Sheet for Patients: bloggercourse.com  Fact Sheet for Healthcare Providers: seriousbroker.it  This test is not yet approved or cleared by the [place]  FDA and has been authorized for detection and/or diagnosis of SARS-CoV-2 by  FDA under an Emergency Use Authorization (EUA). This EUA will remain in effect (meaning this test can be used) for the duration of the COVID-19 declaration under Section 564(b)(1) of the Act, 21 U.S.C. section 360bbb-3(b)(1), unless the authorization is terminated or revoked.  Performed at [facility], [address] [place], [place] [address]   Blood culture (routine x 2)     Status: None   Collection Time: 04/29/24 12:58 AM   Specimen: Right Antecubital; Blood  Result Value Ref Range Status   Specimen Description RIGHT ANTECUBITAL  Final   Special Requests   Final    BOTTLES DRAWN AEROBIC AND ANAEROBIC Blood Culture adequate volume   Culture   Final    NO GROWTH 5 DAYS Performed at [facility], [address] [place], [place]  [address]    Report Status 05/04/2024 FINAL  Final  Blood culture (routine x 2)     Status: None   Collection Time: 04/29/24  1:01 AM   Specimen: Left Antecubital; Blood  Result Value Ref Range Status   Specimen Description LEFT ANTECUBITAL  Final   Special Requests   Final    BOTTLES DRAWN AEROBIC AND ANAEROBIC Blood Culture adequate volume   Culture   Final    NO GROWTH 5 DAYS Performed at [facility], [address], [place], [place] [address]    Report Status 05/04/2024 FINAL  Final  Resp panel by RT-PCR (RSV, Flu A&B, Covid) Anterior Nasal Swab     Status: None   Collection Time: 05/02/24 12:37 PM   Specimen: Anterior Nasal Swab  Result Value Ref Range Status   SARS Coronavirus 2 by RT PCR NEGATIVE NEGATIVE Final    Comment: (NOTE) SARS-CoV-2 target nucleic acids are NOT DETECTED.  The SARS-CoV-2 RNA is generally detectable in upper respiratory specimens during the acute phase of infection. The lowest concentration of SARS-CoV-2 viral copies this assay can detect is 138 copies/mL. A negative result does not preclude SARS-Cov-2 infection and should not be used as the sole basis for treatment or other patient management decisions. A negative result may occur with  improper specimen collection/handling, submission of specimen other than nasopharyngeal swab, presence of viral mutation(s) within the areas targeted by this assay, and inadequate number of viral copies(<138 copies/mL). A negative result must be combined with clinical observations, patient history, and epidemiological information. The expected result is Negative.  Fact Sheet for Patients:  bloggercourse.com  Fact Sheet for Healthcare Providers:  seriousbroker.it  This test is no t yet approved or cleared by the [place]  FDA and  has been authorized for detection and/or diagnosis of SARS-CoV-2 by FDA under an Emergency Use Authorization (EUA). This EUA will  remain  in effect (meaning this test can be used) for the duration of the COVID-19 declaration under Section 564(b)(1) of the Act, 21 U.S.C.section 360bbb-3(b)(1), unless the authorization is terminated  or revoked sooner.       Influenza A by PCR NEGATIVE NEGATIVE Final   Influenza B by PCR NEGATIVE NEGATIVE Final    Comment: (NOTE) The Xpert Xpress SARS-CoV-2/FLU/RSV plus assay is intended as an aid in the diagnosis of influenza from Nasopharyngeal swab specimens and should not be used as a sole basis for treatment. Nasal washings and aspirates are unacceptable for Xpert Xpress SARS-CoV-2/FLU/RSV testing.  Fact Sheet for Patients: bloggercourse.com  Fact Sheet for Healthcare Providers: seriousbroker.it  This test is not yet approved or cleared by the [place]  FDA and has been authorized for detection and/or  diagnosis of SARS-CoV-2 by FDA under an Emergency Use Authorization (EUA). This EUA will remain in effect (meaning this test can be used) for the duration of the COVID-19 declaration under Section 564(b)(1) of the Act, 21 U.S.C. section 360bbb-3(b)(1), unless the authorization is terminated or revoked.     Resp Syncytial Virus by PCR NEGATIVE NEGATIVE Final    Comment: (NOTE) Fact Sheet for Patients: bloggercourse.com  Fact Sheet for Healthcare Providers: seriousbroker.it  This test is not yet approved or cleared by the [place]  FDA and has been authorized for detection and/or diagnosis of SARS-CoV-2 by FDA under an Emergency Use Authorization (EUA). This EUA will remain in effect (meaning this test can be used) for the duration of the COVID-19 declaration under Section 564(b)(1) of the Act, 21 U.S.C. section 360bbb-3(b)(1), unless the authorization is terminated or revoked.  Performed at [facility], [address] [place], [place] [address]       Radiology Studies: No results found.  Scheduled Meds:  amLODipine   5 mg Oral Daily   busPIRone   5 mg Oral BID   cholecalciferol   2,000 Units Oral Daily   cyanocobalamin   1,000 mcg Oral Daily   famotidine   20 mg Oral Daily   ferrous sulfate   325 mg Oral Q breakfast   folic acid   1 mg Oral Daily   losartan   100 mg Oral q morning   polyethylene glycol  17 g Oral Daily   senna-docusate  1 tablet Oral BID   sertraline   100 mg Oral Daily   Continuous Infusions:  sodium chloride       LOS: 7 days   Time spent: 55 mins  Abrian Hanover Vicci, MD How to contact the [facility] Attending or Consulting provider 7A - 7P or covering provider during after hours 7P -7A, for this patient?  Check the care team in [facility] and look for a) attending/consulting TRH provider listed and b) the TRH team listed Log into [email or web address] to find provider on call.  Locate the TRH provider you are looking for under [facility] and page to a number that you can be directly reached. If you still have difficulty reaching the provider, please page the [facility] (Director on Call) for the Hospitalists listed on amion for assistance.  05/06/2024, 11:40 AM    "

## 2024-05-06 NOTE — Plan of Care (Signed)
   Problem: Education: Goal: Knowledge of General Education information will improve Description Including pain rating scale, medication(s)/side effects and non-pharmacologic comfort measures Outcome: Progressing   Problem: Health Behavior/Discharge Planning: Goal: Ability to manage health-related needs will improve Outcome: Progressing

## 2024-05-06 NOTE — Evaluation (Signed)
 Clinical/Bedside Swallow Evaluation Patient Details  Name: Catherine West MRN: [id number] Date of Birth: 1920-12-18  Today's Date: 05/06/2024 Time: SLP Start Time (ACUTE ONLY): 1430 SLP Stop Time (ACUTE ONLY): 1450 SLP Time Calculation (min) (ACUTE ONLY): 20 min  Past Medical History:  Past Medical History:  Diagnosis Date   Acute bronchitis    Acute bronchitis    Chronic kidney disease, stage 2 (mild)    Chronic kidney disease, unspecified    Cough    Essential (primary) hypertension    HTN (hypertension)    Hyperlipidemia, unspecified    Hypertensive chronic kidney disease with stage 1 through stage 4 chronic kidney disease, or unspecified chronic kidney disease    Hypothyroidism, unspecified    Major depressive disorder, single episode, unspecified    Osteoporosis    Osteoporosis    Pneumonia    Pneumonia    Urinary tract infection    Urinary tract infection    Past Surgical History:  Past Surgical History:  Procedure Laterality Date   LESION REMOVAL Left 11/10/2022   Procedure: LESION REMOVAL TRUNK;  Surgeon: Mavis Anes, MD;  Location: [facility];  Service: General;  Laterality: Left;  left lower trunk   HPI:       Assessment / Plan / Recommendation  Clinical Impression  Pt presents with suspected oropharyngeal swallow with challenges managing serial swallows of thin liquids and impaired mastication of solid textures. Pt unable to provide much history, chart review reveals baseline difficulties with thin liquids documented in 2024. CXR demonstrating mid R lung opacities, RN reports coughing with straw sips of liquid, doing ok with puree and tsp sips thins. Pt agreeable to evaluation, oral care provided and HOB raised. OME completed, all structures assessed appear to be within gross normal limits, voice is clear but soft. No overt s/sx aspiration noted with ice chips, cued single sips of thin liquids via cup (assisted by SLP), cued single sips via straw, and puree solids.  Strong cough response with reddening of face and shortness of breath when pt attempted to take sequential sips of thin. Dys 3 solid notable for imapired mastication and inability to trigger pharygneal swallow. Pt had to expel anteriorly. Recommend thin liquid via SINGLE SIPS only with verbal cues provided to aid in adherence, dysphagia 2 solids, meds crushed in puree (as able). Strict aspiration precautions: only feed when alert, sit pt fully upright for all intake -- preferably out of bed, single/small sips, BID oral care with toothbrush and toothpaste, allow pt to self-feed as able. ST will monitor for tolerance. SLP Visit Diagnosis: Dysphagia, oropharyngeal phase (R13.12)    Aspiration Risk  Moderate aspiration risk    Diet Recommendation    Dysphagia 2, thin liquids       Other Recommendations   Assist with intake, verbal cues for single sips, aspiration precautions     Swallow Evaluation Recommendations Recommendations: PO diet PO Diet Recommendation: Dysphagia 2 (Finely chopped);Thin liquids (Level 0) Liquid Administration via: Cup;Straw (cues for single sips) Medication Administration: Crushed with puree Supervision: Intermittent supervision/cueing for swallowing strategies Swallowing strategies  : Small bites/sips;Slow rate;Check for pocketing or oral holding;Avoid mixed consistencies Postural changes: Position pt fully upright for meals;Stay upright 30-60 min after meals Oral care recommendations: Oral care BID (2x/day)   Assistance Recommended at Discharge    Functional Status Assessment Patient has had a recent decline in their functional status and demonstrates the ability to make significant improvements in function in a reasonable and predictable amount of time.  Frequency and Duration min 1 x/week  1 week       Prognosis Prognosis for improved oropharyngeal function: Fair      Swallow Study   General Type of Study: Bedside Swallow Evaluation Previous Swallow  Assessment: CSE Aug/Sept 2024 Diet Prior to this Study: Dysphagia 3 (mechanical soft);Thin liquids (Level 0) Temperature Spikes Noted: No Respiratory Status: Nasal cannula History of Recent Intubation: No Behavior/Cognition: Alert;Cooperative Oral Cavity Assessment: Within Functional Limits Oral Care Completed by SLP: Yes Oral Cavity - Dentition: Adequate natural dentition;Missing dentition Vision: Functional for self-feeding Self-Feeding Abilities: Needs assist Patient Positioning: Upright in bed Baseline Vocal Quality: Low vocal intensity Volitional Cough: Strong Volitional Swallow: Able to elicit    Oral/Motor/Sensory Function Overall Oral Motor/Sensory Function: Within functional limits   Ice Chips Ice chips: Within functional limits   Thin Liquid Thin Liquid: Impaired Presentation: Cup;Straw;Spoon Pharyngeal  Phase Impairments: Multiple swallows;Cough - Immediate Other Comments: Immediate strong cough evidenced with sequential sips thin liquids. Mitigated with cued small single sips via cup and straw.    Nectar Thick Nectar Thick Liquid: Not tested   Honey Thick Honey Thick Liquid: Not tested   Puree Puree: Within functional limits   Solid     Solid: Impaired Oral Phase Impairments: Impaired mastication Oral Phase Functional Implications: Impaired mastication;Oral residue Pharyngeal Phase Impairments: Unable to trigger swallow Other Comments: dysphagia 3 solid unable to be masticated, required expulsion.      Harlene LITTIE Ned 05/06/2024,2:53 PM

## 2024-05-07 MED ORDER — ADULT MULTIVITAMIN W/MINERALS CH
1.0000 | ORAL_TABLET | Freq: Every day | ORAL | Status: DC
  Administered 2024-05-07 – 2024-05-08 (×2): 1 via ORAL
  Filled 2024-05-07 (×2): qty 1

## 2024-05-07 MED ORDER — ENSURE PLUS HIGH PROTEIN PO LIQD
237.0000 mL | Freq: Two times a day (BID) | ORAL | Status: DC
  Administered 2024-05-07 – 2024-05-08 (×3): 237 mL via ORAL

## 2024-05-07 NOTE — Progress Notes (Signed)
 Initial Nutrition Assessment  DOCUMENTATION CODES:  Not applicable  INTERVENTION:  Ensure Plus High Protein po BID, each supplement provides 350 kcal and 20 grams of protein Magic cup TID with meals, each supplement provides 290 kcal and 9 grams of protein MVI with minerals daily  NUTRITION DIAGNOSIS:  Inadequate oral intake related to dysphagia as evidenced by meal completion < 50%.  GOAL:  Patient will meet greater than or equal to 90% of their needs  MONITOR:  PO intake, Supplement acceptance  REASON FOR ASSESSMENT:  Consult Assessment of nutrition requirement/status  ASSESSMENT:  89 yo female admitted with acute respiratory failure, PNA. PMH includes HTN, osteoporosis, HLD, depression, CKD, hypothyroidism, HF.  SLP following for dysphagia. Diet was downgraded to dysphagia 2 with thin liquids on 1/4. Meal intakes 5-40% for the past 3 meals documented.  Weight history reviewed.  10/29/23: 58.7 kg 05/07/24: 52.1 kg 11% weight loss within the past 6 months is severe. Suspect patient is malnourished. Unable to obtain enough information at this time for identification of malnutrition.   Labs reviewed.  K 3.3 (1/4)  Medications reviewed and include vitamin D3, vitamin B12, pepcid , ferrous sulfate , folic acid , miralax , senokot-s.  NUTRITION - FOCUSED PHYSICAL EXAM: Unable to complete  Diet Order:   Diet Order             DIET DYS 2 Room service appropriate? Yes; Fluid consistency: Thin  Diet effective now                   EDUCATION NEEDS:  No education needs have been identified at this time  Skin:  Skin Assessment: Skin Integrity Issues: Skin Integrity Issues:: Stage I, Other (Comment) Stage I: sacrum Other: L pretibial skin tear  Last BM:  1/4 type 6  Height:  Ht Readings from Last 1 Encounters:  04/28/24 5' 2 (1.575 m)    Weight:  Wt Readings from Last 1 Encounters:  05/07/24 52.1 kg    Ideal Body Weight:  50 kg  BMI:  Body mass index is  21.01 kg/m.  Estimated Nutritional Needs:  Kcal:  1350-1500 Protein:  65-75 gm Fluid:  1.5 L   Suzen HUNT RD, LDN, CNSC Contact via secure chat. If unavailable, use group chat RD Inpatient.

## 2024-05-07 NOTE — Plan of Care (Signed)

## 2024-05-07 NOTE — Plan of Care (Signed)
  Problem: Education: Goal: Knowledge of General Education information will improve Description: Including pain rating scale, medication(s)/side effects and non-pharmacologic comfort measures Outcome: Progressing   Problem: Health Behavior/Discharge Planning: Goal: Ability to manage health-related needs will improve Outcome: Progressing   Problem: Clinical Measurements: Goal: Ability to maintain clinical measurements within normal limits will improve Outcome: Progressing Goal: Cardiovascular complication will be avoided Outcome: Progressing   Problem: Pain Managment: Goal: General experience of comfort will improve and/or be controlled Outcome: Progressing   Problem: Safety: Goal: Ability to remain free from injury will improve Outcome: Progressing

## 2024-05-07 NOTE — TOC Progression Note (Signed)
 Transition of Care [facility]) - Progression Note    Patient Details  Name: Catherine West MRN: [id number] Date of Birth: 04/15/1921  [facility] CM/SW Contact  [place] Saddie Kim, [place] Phone Number: 05/07/2024, 2:41 PM  Clinical Narrative: Pt will be private pay at SNF. Daughter requested referral be sent out again. Reviewed bed offers and she accepts [facility]. Daughter also asked about hospice. MD reports pt would be appropriate. [facility] will refer pt to hospice after admission. Pt's daughter will be in town tomorrow and will pay for month up front on admission. MD updated.        Expected Discharge Plan: Assisted Living Barriers to Discharge: Continued Medical Work up               Expected Discharge Plan and Services In-house Referral: Clinical Social Work Discharge Planning Services: CM Consult Post Acute Care Choice: Resumption of Svcs/PTA Provider Living arrangements for the past 2 months: Assisted Living Facility                                       Social Drivers of Health (SDOH) Interventions SDOH Screenings   Food Insecurity: Patient Unable To Answer (04/29/2024)  Housing: Patient Unable To Answer (04/29/2024)  Transportation Needs: Patient Unable To Answer (04/29/2024)  Utilities: Patient Unable To Answer (04/29/2024)  Social Connections: Patient Unable To Answer (04/29/2024)  Tobacco Use: Low Risk (04/28/2024)    Readmission Risk Interventions    05/01/2024    3:20 PM  Readmission Risk Prevention Plan  Transportation Screening Complete  Home Care Screening Complete  Medication Review (RN CM) Complete

## 2024-05-07 NOTE — Progress Notes (Signed)
 " PROGRESS NOTE   Catherine West  FMW:[id number] DOB: [birth year] DOA: 04/28/2024 PCP: Shona Norleen PEDLAR, MD   Chief Complaint  Patient presents with   Fall   Level of care: Med-Surg  Brief Admission History:  89 y.o. female with medical history significant of HTN, HLD, hypothyroidism, depression, CKD 3,  HFrEF (echo 09/2022 EF 35-40%) who presents to the emergency department due to fall when patient rolled out of bed and sustained a bruise on her forehead causing hematoma.  Oxygen  level was also noted to drop into the 80s on room air, chest x-ray was suspicious for RML pneumonia.    Assessment and Plan:  Acute hypoxic respiratory failure - Initially requiring 2 L nasal cannula to maintain O2 sats in the 90s.  Intermittently room down to room air.   Acute right middle lobe pneumonia - Suspect aspiration pneumonia.  Noted cough and mild hypoxia.  DC unasyn /azithromycin .  Augmentin  x 2 more days to complete course thru 05/05/24    Hypokalemia - this is being repleted, and it is improved   Acute on chronic HFrEF -she has diuresed well on IV lasix , hold torsemide  today due to dehydration  Leukocytosis -- secondary to pneumonia -- WBC trending down with treatments   AKI on CKD stage 3b  - renal function stabilized now, she likely has stage 3b CKD  -- creatinine bumped 1/2 likely from dehydration, hold diuretic, -- gently hydrated 1/4.  -- recheck BMP in AM    Dysphagia -- requested SLP evaluation -- started dysphagia 3 diet   Hypertension -  losartan  100 mg daily and amlodipine  5 mg.   Hypothyroidism/hyperlipidemia/depression - Will resume home medication regiment.  DVT prophylaxis: SCDs Code Status: DNR  Family Communication:  Disposition: declined by insurance for SNF placement, peer to peer completed 05/04/24 and declined, insurance feels like she does not have rehab potential and final denial issued 05/04/24. TOC working with a back up plan.  ALF plans to come today to assess if  she can return.       Consultants:   Procedures:   Subjective: No specific complaints today.       Objective: Vitals:   05/06/24 1251 05/06/24 2143 05/07/24 0605 05/07/24 1214  BP: (!) 150/73 (!) 168/81 (!) 159/82 132/86  Pulse: 70 78 74 82  Resp: (!) 30 20 18    Temp: (!) 97.4 F (36.3 C) 97.6 F (36.4 C) 97.6 F (36.4 C) 98.2 F (36.8 C)  TempSrc: Oral  Axillary Oral  SpO2: 94% 93% 95% 94%  Weight:   52.1 kg   Height:        Intake/Output Summary (Last 24 hours) at 05/07/2024 1243 Last data filed at 05/07/2024 9072 Gross per 24 hour  Intake 150 ml  Output --  Net 150 ml   Filed Weights   05/05/24 0426 05/06/24 0509 05/07/24 0605  Weight: 56.7 kg 50.5 kg 52.1 kg   Examination:  General exam: dry mucus membranes, frail, elderly female, awake, alert, cooperative, Appears calm and comfortable  Respiratory system:  Respiratory effort normal. Cardiovascular system: normal S1 & S2 heard. No JVD, murmurs, rubs, gallops or clicks. No pedal edema. Gastrointestinal system: Abdomen is nondistended, soft and nontender. No organomegaly or masses felt. Normal bowel sounds heard. Central nervous system: Alert and oriented. No focal neurological deficits. Extremities: Symmetric 5 x 5 power. Skin: No rashes, lesions or ulcers. Psychiatry: Judgement and insight appear normal. Mood & affect appropriate.   Data Reviewed: I have personally reviewed following  labs and imaging studies  CBC: Recent Labs  Lab 05/01/24 0431 05/02/24 0425  WBC 11.8* 11.5*  HGB 10.2* 10.4*  HCT 31.2* 32.1*  MCV 98.1 97.6  PLT 245 241    Basic Metabolic Panel: Recent Labs  Lab 05/01/24 0431 05/02/24 0425 05/03/24 0806 05/04/24 0426 05/06/24 0436  NA 144 142 139 138 142  K 2.6* 2.9* 3.3* 3.7 3.3*  CL 104 100 99 98 100  CO2 33* 34* 26 35* 35*  GLUCOSE 98 87 98 86 87  BUN 20 18 16 18  29*  CREATININE 1.10* 1.15* 1.11* 1.14* 1.47*  CALCIUM 8.4* 8.7* 8.7* 8.9 8.5*  MG 2.4 1.9 2.0  --   --     CBG: No results for input(s): GLUCAP in the last 168 hours.  Recent Results (from the past 240 hours)  Resp panel by RT-PCR (RSV, Flu A&B, Covid) Anterior Nasal Swab     Status: None   Collection Time: 04/29/24 12:05 AM   Specimen: Anterior Nasal Swab  Result Value Ref Range Status   SARS Coronavirus 2 by RT PCR NEGATIVE NEGATIVE Final    Comment: (NOTE) SARS-CoV-2 target nucleic acids are NOT DETECTED.  The SARS-CoV-2 RNA is generally detectable in upper respiratory specimens during the acute phase of infection. The lowest concentration of SARS-CoV-2 viral copies this assay can detect is 138 copies/mL. A negative result does not preclude SARS-Cov-2 infection and should not be used as the sole basis for treatment or other patient management decisions. A negative result may occur with  improper specimen collection/handling, submission of specimen other than nasopharyngeal swab, presence of viral mutation(s) within the areas targeted by this assay, and inadequate number of viral copies(<138 copies/mL). A negative result must be combined with clinical observations, patient history, and epidemiological information. The expected result is Negative.  Fact Sheet for Patients:  bloggercourse.com  Fact Sheet for Healthcare Providers:  seriousbroker.it  This test is no t yet approved or cleared by the [place]  FDA and  has been authorized for detection and/or diagnosis of SARS-CoV-2 by FDA under an Emergency Use Authorization (EUA). This EUA will remain  in effect (meaning this test can be used) for the duration of the COVID-19 declaration under Section 564(b)(1) of the Act, 21 U.S.C.section 360bbb-3(b)(1), unless the authorization is terminated  or revoked sooner.       Influenza A by PCR NEGATIVE NEGATIVE Final   Influenza B by PCR NEGATIVE NEGATIVE Final    Comment: (NOTE) The Xpert Xpress SARS-CoV-2/FLU/RSV plus assay  is intended as an aid in the diagnosis of influenza from Nasopharyngeal swab specimens and should not be used as a sole basis for treatment. Nasal washings and aspirates are unacceptable for Xpert Xpress SARS-CoV-2/FLU/RSV testing.  Fact Sheet for Patients: bloggercourse.com  Fact Sheet for Healthcare Providers: seriousbroker.it  This test is not yet approved or cleared by the [place]  FDA and has been authorized for detection and/or diagnosis of SARS-CoV-2 by FDA under an Emergency Use Authorization (EUA). This EUA will remain in effect (meaning this test can be used) for the duration of the COVID-19 declaration under Section 564(b)(1) of the Act, 21 U.S.C. section 360bbb-3(b)(1), unless the authorization is terminated or revoked.     Resp Syncytial Virus by PCR NEGATIVE NEGATIVE Final    Comment: (NOTE) Fact Sheet for Patients: bloggercourse.com  Fact Sheet for Healthcare Providers: seriousbroker.it  This test is not yet approved or cleared by the [place]  FDA and has been authorized for detection and/or  diagnosis of SARS-CoV-2 by FDA under an Emergency Use Authorization (EUA). This EUA will remain in effect (meaning this test can be used) for the duration of the COVID-19 declaration under Section 564(b)(1) of the Act, 21 U.S.C. section 360bbb-3(b)(1), unless the authorization is terminated or revoked.  Performed at [facility], [address] [place], [place] [address]   Blood culture (routine x 2)     Status: None   Collection Time: 04/29/24 12:58 AM   Specimen: Right Antecubital; Blood  Result Value Ref Range Status   Specimen Description RIGHT ANTECUBITAL  Final   Special Requests   Final    BOTTLES DRAWN AEROBIC AND ANAEROBIC Blood Culture adequate volume   Culture   Final    NO GROWTH 5 DAYS Performed at [facility], [address] [place], [place]  [address]    Report Status 05/04/2024 FINAL  Final  Blood culture (routine x 2)     Status: None   Collection Time: 04/29/24  1:01 AM   Specimen: Left Antecubital; Blood  Result Value Ref Range Status   Specimen Description LEFT ANTECUBITAL  Final   Special Requests   Final    BOTTLES DRAWN AEROBIC AND ANAEROBIC Blood Culture adequate volume   Culture   Final    NO GROWTH 5 DAYS Performed at [facility], [address], [place], [place] [address]    Report Status 05/04/2024 FINAL  Final  Resp panel by RT-PCR (RSV, Flu A&B, Covid) Anterior Nasal Swab     Status: None   Collection Time: 05/02/24 12:37 PM   Specimen: Anterior Nasal Swab  Result Value Ref Range Status   SARS Coronavirus 2 by RT PCR NEGATIVE NEGATIVE Final    Comment: (NOTE) SARS-CoV-2 target nucleic acids are NOT DETECTED.  The SARS-CoV-2 RNA is generally detectable in upper respiratory specimens during the acute phase of infection. The lowest concentration of SARS-CoV-2 viral copies this assay can detect is 138 copies/mL. A negative result does not preclude SARS-Cov-2 infection and should not be used as the sole basis for treatment or other patient management decisions. A negative result may occur with  improper specimen collection/handling, submission of specimen other than nasopharyngeal swab, presence of viral mutation(s) within the areas targeted by this assay, and inadequate number of viral copies(<138 copies/mL). A negative result must be combined with clinical observations, patient history, and epidemiological information. The expected result is Negative.  Fact Sheet for Patients:  bloggercourse.com  Fact Sheet for Healthcare Providers:  seriousbroker.it  This test is no t yet approved or cleared by the [place]  FDA and  has been authorized for detection and/or diagnosis of SARS-CoV-2 by FDA under an Emergency Use Authorization (EUA). This EUA will  remain  in effect (meaning this test can be used) for the duration of the COVID-19 declaration under Section 564(b)(1) of the Act, 21 U.S.C.section 360bbb-3(b)(1), unless the authorization is terminated  or revoked sooner.       Influenza A by PCR NEGATIVE NEGATIVE Final   Influenza B by PCR NEGATIVE NEGATIVE Final    Comment: (NOTE) The Xpert Xpress SARS-CoV-2/FLU/RSV plus assay is intended as an aid in the diagnosis of influenza from Nasopharyngeal swab specimens and should not be used as a sole basis for treatment. Nasal washings and aspirates are unacceptable for Xpert Xpress SARS-CoV-2/FLU/RSV testing.  Fact Sheet for Patients: bloggercourse.com  Fact Sheet for Healthcare Providers: seriousbroker.it  This test is not yet approved or cleared by the [place]  FDA and has been  authorized for detection and/or diagnosis of SARS-CoV-2 by FDA under an Emergency Use Authorization (EUA). This EUA will remain in effect (meaning this test can be used) for the duration of the COVID-19 declaration under Section 564(b)(1) of the Act, 21 U.S.C. section 360bbb-3(b)(1), unless the authorization is terminated or revoked.     Resp Syncytial Virus by PCR NEGATIVE NEGATIVE Final    Comment: (NOTE) Fact Sheet for Patients: bloggercourse.com  Fact Sheet for Healthcare Providers: seriousbroker.it  This test is not yet approved or cleared by the [place]  FDA and has been authorized for detection and/or diagnosis of SARS-CoV-2 by FDA under an Emergency Use Authorization (EUA). This EUA will remain in effect (meaning this test can be used) for the duration of the COVID-19 declaration under Section 564(b)(1) of the Act, 21 U.S.C. section 360bbb-3(b)(1), unless the authorization is terminated or revoked.  Performed at [facility], [address] [place], [place] [address]       Radiology Studies: No results found.  Scheduled Meds:  amLODipine   5 mg Oral Daily   busPIRone   5 mg Oral BID   cholecalciferol   2,000 Units Oral Daily   cyanocobalamin   1,000 mcg Oral Daily   famotidine   20 mg Oral Daily   feeding supplement  237 mL Oral BID BM   ferrous sulfate   325 mg Oral Q breakfast   folic acid   1 mg Oral Daily   losartan   100 mg Oral q morning   multivitamin with minerals  1 tablet Oral Daily   polyethylene glycol  17 g Oral Daily   senna-docusate  1 tablet Oral BID   sertraline   100 mg Oral Daily   Continuous Infusions:   LOS: 8 days   Time spent: 55 mins  Alette Kataoka Vicci, MD How to contact the [facility] Attending or Consulting provider 7A - 7P or covering provider during after hours 7P -7A, for this patient?  Check the care team in [facility] and look for a) attending/consulting TR[facility] provider listed and b) the TRH team listed Log into [email or web address] to find provider on call.  Locate the TRH provider you are looking for under [facility] and page to a number that you can be directly reached. If you still have difficulty reaching the provider, please page the [facility] (Director on Call) for the Hospitalists listed on amion for assistance.  05/07/2024, 12:43 PM    "

## 2024-05-07 NOTE — Progress Notes (Signed)
 Physical Therapy Treatment Patient Details Name: Catherine West MRN: [id number] DOB: May 07, 1920 Today's Date: 05/07/2024   History of Present Illness Catherine West is a 89 y.o. female with medical history significant of HFrEF (echo 09/2022 EF 35-40%, poss PFO)hyperlipidemia, hypertension, CKD 3, hypothyroidism, depression,. [name] brought to ED after rolling out of bed and sustaining head trauma.  While in ED patient noted to have cough, elevated white count, negative RSV, covid flu. She was afebrile. While sleeping, sats noted to decrease in the 80s on room air.    Chest xray suspicious for RML pneumonia  UA rare bacteria white cells, no nitrates.  Cratinine at baseline 1.4  Sodium 133  Alkphos 143  She was given 1L ns rocephin  and azithromycin  IV  Head trauma - head and spine CT negative for acute findings    PT Comments  Patient demonstrates slow labored movement for sitting up at bedside with c/o severe pain over buttocks, very unsteady on feet and limited to a few unsteady labored side steps during transfers to [facility] and to chair. Patient had bouts of severe diarrhea when standing and tolerated sitting on BSC to finish and tolerated sitting up in chair after therapy - NT aware. Patient will benefit from continued skilled physical therapy in hospital and recommended venue below to increase strength, balance, endurance for safe ADLs and gait.      If plan is discharge home, recommend the following: A lot of help with bathing/dressing/bathroom;A lot of help with walking and/or transfers;Help with stairs or ramp for entrance;Assist for transportation;Assistance with cooking/housework   Can travel by private vehicle     No  Equipment Recommendations  None recommended by PT    Recommendations for Other Services       Precautions / Restrictions Precautions Precautions: Fall Recall of Precautions/Restrictions: Impaired Restrictions Weight Bearing Restrictions Per Provider Order: No      Mobility  Bed Mobility Overal bed mobility: Needs Assistance Bed Mobility: Supine to Sit     Supine to sit: Max assist     General bed mobility comments: increased time, labored movement, c/o increased pain over buttocks    Transfers Overall transfer level: Needs assistance Equipment used: Rolling walker (2 wheels) Transfers: Sit to/from Stand, Bed to chair/wheelchair/BSC Sit to Stand: Mod assist, Max assist   Step pivot transfers: Mod assist, Max assist       General transfer comment: unsteady labored movement, BLE weakness    Ambulation/Gait Ambulation/Gait assistance: Mod assist, Max assist Gait Distance (Feet): 5 Feet Assistive device: Rolling walker (2 wheels) Gait Pattern/deviations: Decreased step length - left, Decreased stance time - right, Decreased stride length, Shuffle Gait velocity: slow     General Gait Details: limited to a few slow labored shuffling side steps before having to sit due to BLE weakness, fall risk   Stairs             Wheelchair Mobility     Tilt Bed    Modified Rankin (Stroke Patients Only)       Balance Overall balance assessment: Needs assistance Sitting-balance support: Feet supported, No upper extremity supported Sitting balance-Leahy Scale: Poor Sitting balance - Comments: fair/poor seated at EOB   Standing balance support: Reliant on assistive device for balance, During functional activity, Bilateral upper extremity supported Standing balance-Leahy Scale: Poor Standing balance comment: using RW  Communication Communication Communication: Impaired Factors Affecting Communication: Hearing impaired  Cognition Arousal: Alert Behavior During Therapy: Flat affect                             Following commands: Impaired Following commands impaired: Follows one step commands with increased time    Cueing Cueing Techniques: Verbal cues, Tactile cues   Exercises      General Comments        Pertinent Vitals/Pain Pain Assessment Pain Assessment: Faces Faces Pain Scale: Hurts little more Pain Location: over buttocks Pain Descriptors / Indicators: Discomfort, Grimacing, Sharp Pain Intervention(s): Limited activity within patient's tolerance, Monitored during session, Repositioned    Home Living                          Prior Function            PT Goals (current goals can now be found in the care plan section) Acute Rehab PT Goals Patient Stated Goal: return home PT Goal Formulation: With patient Time For Goal Achievement: 05/15/24 Potential to Achieve Goals: Good Progress towards PT goals: Progressing toward goals    Frequency    Min 3X/week      PT Plan      Co-evaluation              AM-PAC PT 6 Clicks Mobility   Outcome Measure  Help needed turning from your back to your side while in a flat bed without using bedrails?: A Lot Help needed moving from lying on your back to sitting on the side of a flat bed without using bedrails?: A Lot Help needed moving to and from a bed to a chair (including a wheelchair)?: A Lot Help needed standing up from a chair using your arms (e.g., wheelchair or bedside chair)?: A Lot Help needed to walk in hospital room?: A Lot Help needed climbing 3-5 steps with a railing? : Total 6 Click Score: 11    End of Session   Activity Tolerance: Patient tolerated treatment well;Patient limited by fatigue Patient left: in chair;with call bell/phone within reach Nurse Communication: Mobility status PT Visit Diagnosis: Other abnormalities of gait and mobility (R26.89);Difficulty in walking, not elsewhere classified (R26.2);Adult, failure to thrive (R62.7);History of falling (Z91.81);Muscle weakness (generalized) (M62.81);Pain     Time: 8946-8874 PT Time Calculation (min) (ACUTE ONLY): 32 min  Charges:    $Therapeutic Activity: 23-37 mins PT General Charges $$  ACUTE PT VISIT: 1 Visit                     3:13 PM, 05/07/2024 Lynwood Music, MPT Physical Therapist with [facility] [phone] office [phone] mobile phone

## 2024-05-07 NOTE — Progress Notes (Signed)
 Speech Language Pathology Treatment: Dysphagia  Patient Details Name: Catherine West MRN: [id number] DOB: 1921/02/13 Today's Date: 05/07/2024 Time: 8849-8795 SLP Time Calculation (min) (ACUTE ONLY): 14 min  Assessment / Plan / Recommendation Clinical Impression  Recommend continue current diet of Dysphagia 2(minced)/thin liquids via small sips either by cup/straw and/or tsp. Medications crushed in puree for ease of transition/safety.  ST will s/o at this time as goals have been met for dysphagia.    Pt seen for dysphagia f/u tx session with limited intake as pt with decreased satiety, but min verbal cues and encouragement required/provided for increased po intake.  Pt consumed thin via tsp and small cup sips, puree and dysphagia 2(minced) consistencies with prolonged mastication and delayed swallow initiation suspect d/t presbyphagia (aging swallow) paired with deconditioning impacting cumulative swallow function.  No overt s/s of aspiration noted during abbreviated trial.  Discussed swallow strategies with nursing tech when she arrived to A with meal prior to SLP departure with acknowledgement noted.  Pt's vocal quality continued to be soft, but clear during all intake.  No further ST needs at this time.     HPI HPI: Catherine West is a 89 y.o. female with medical history significant of HTN, HLD, hypothyroidism, depression, CKD 3,  HFrEF (echo 09/2022 EF 35-40%) who presents to the emergency department due to fall when patient rolled out of bed and sustained a bruise on her forehead causing hematoma.  She complained of several days of cough and was noted with leukocytosis in the ED.  Oxygen  level was also noted to drop into the 80s on room air when she falls asleep.    Chest x-ray was suspicious for RML pneumonia.  She was started on IV ceftriaxone  and azithromycin .  IV hydration was provided in the ED.  Continue with Unasyn  and Zithromax  due to suspicion for aspiration pneumonia.  BSE completed on  05/06/24 recommending Dysphagia 2/thin with small sips; ST f/u for diet tolerance/education.      SLP Plan  Discharge SLP treatment due to (comment) (goals met)        Swallow Evaluation Recommendations   Recommendations: PO diet PO Diet Recommendation: Dysphagia 2 (Finely chopped);Thin liquids (Level 0) Liquid Administration via: Cup;Other (Comment);Spoon;Straw (small sips) Medication Administration: Crushed with puree Supervision: Intermittent supervision/cueing for swallowing strategies;Full assist for feeding Postural changes: Position pt fully upright for meals Oral care recommendations: Oral care BID (2x/day);Staff/trained caregiver to provide oral care     Recommendations   PO diet (Dysphagia 2(minced)/thin via small sips; Meds crushed in puree                  Oral care BID;Staff/trained caregiver to provide oral care   Frequent or constant Supervision/Assistance Dysphagia, oropharyngeal phase (R13.12)     Discharge SLP treatment due to (comment) (goals met)     Pat [name],M.S.,CCC-SLP  05/07/2024, 12:16 PM

## 2024-05-08 DIAGNOSIS — E43 Unspecified severe protein-calorie malnutrition: Secondary | ICD-10-CM

## 2024-05-08 MED ORDER — BUMETANIDE 1 MG PO TABS: 1.0000 mg | ORAL_TABLET | ORAL | Status: AC

## 2024-05-08 MED ORDER — ACETAMINOPHEN 325 MG PO TABS: 325.0000 mg | ORAL_TABLET | Freq: Four times a day (QID) | ORAL | Status: DC | PRN

## 2024-05-08 MED ORDER — ONDANSETRON HCL 4 MG/2ML IJ SOLN: 4.0000 mg | Freq: Four times a day (QID) | INTRAMUSCULAR | Status: DC | PRN

## 2024-05-08 MED ORDER — POLYETHYLENE GLYCOL 3350 17 G PO PACK: 17.0000 g | PACK | Freq: Every day | ORAL | Status: AC | PRN

## 2024-05-08 MED ORDER — ADULT MULTIVITAMIN W/MINERALS CH: 1.0000 | ORAL_TABLET | Freq: Every day | ORAL | Status: AC

## 2024-05-08 MED ORDER — PROCHLORPERAZINE EDISYLATE 10 MG/2ML IJ SOLN
5.0000 mg | Freq: Four times a day (QID) | INTRAMUSCULAR | Status: DC | PRN
  Administered 2024-05-08: 5 mg via INTRAVENOUS
  Filled 2024-05-08: qty 2

## 2024-05-08 MED ORDER — FAMOTIDINE 20 MG PO TABS: 20.0000 mg | ORAL_TABLET | Freq: Every day | ORAL | Status: AC

## 2024-05-08 MED ORDER — POTASSIUM CHLORIDE CRYS ER 10 MEQ PO TBCR: 10.0000 meq | EXTENDED_RELEASE_TABLET | Freq: Every morning | ORAL | Status: AC

## 2024-05-08 NOTE — Progress Notes (Signed)
 Nutrition Follow-up  DOCUMENTATION CODES:  Severe malnutrition in context of chronic illness  INTERVENTION:  Ensure Plus High Protein po BID, each supplement provides 350 kcal and 20 grams of protein Magic cup TID with meals, each supplement provides 290 kcal and 9 grams of protein MVI with minerals daily  NUTRITION DIAGNOSIS:  Severe Malnutrition related to chronic illness (HF, CKD) as evidenced by severe muscle depletion, severe fat depletion.  GOAL:  Patient will meet greater than or equal to 90% of their needs  MONITOR:  PO intake, Supplement acceptance  REASON FOR ASSESSMENT:  Consult Assessment of nutrition requirement/status  ASSESSMENT:  89 yo female admitted with acute respiratory failure, PNA. PMH includes HTN, osteoporosis, HLD, depression, CKD, hypothyroidism, HF.  Patient c/o poor appetite. She was coughing when RD entered room. Visitor says she just had a drink of water. Patient says that she likes Ensure supplements. Per MAR, she was given one this morning, unsure how much she drank.   Patient remains on a dysphagia 2 diet with thin liquids.  Meal intakes: 5% breakfast yesterday and today  Weight history reviewed.  10/29/23: 58.7 kg 05/07/24: 52.1 kg 05/08/24: 52.6 kg 11% weight loss within 6 months is severe.  Patient meets criteria for severe malnutrition, given severe depletion of muscle and subcutaneous fat mass with 11% weight loss x 6 months.  Labs reviewed.  K 3.3 (1/4)  Medications reviewed and include vitamin D3, vitamin B12, pepcid , ferrous sulfate , folic acid , MVI with minerals, miralax , senokot-s.  NUTRITION - FOCUSED PHYSICAL EXAM: Flowsheet Row Most Recent Value  Orbital Region Severe depletion  Upper Arm Region Severe depletion  Thoracic and Lumbar Region Severe depletion  Buccal Region Severe depletion  Temple Region Severe depletion  Clavicle Bone Region Severe depletion  Clavicle and Acromion Bone Region Severe depletion  Scapular  Bone Region Severe depletion  Dorsal Hand Severe depletion  Patellar Region Severe depletion  Anterior Thigh Region Severe depletion  Posterior Calf Region Severe depletion  Edema (RD Assessment) None  Hair Reviewed  Eyes Reviewed  Mouth Reviewed  Skin Reviewed  Nails Reviewed    Diet Order:   Diet Order             DIET DYS 2 Room service appropriate? Yes; Fluid consistency: Thin  Diet effective now                   EDUCATION NEEDS:  Education needs have been addressed  Skin:  Skin Assessment: Skin Integrity Issues: Skin Integrity Issues:: Stage I, Other (Comment) Stage I: sacrum Other: L pretibial skin tear  Last BM:  1/5 type 6  Height:  Ht Readings from Last 1 Encounters:  04/28/24 5' 2 (1.575 m)    Weight:  Wt Readings from Last 1 Encounters:  05/08/24 52.6 kg    Ideal Body Weight:  50 kg  BMI:  Body mass index is 21.21 kg/m.  Estimated Nutritional Needs:  Kcal:  1350-1500 Protein:  65-75 gm Fluid:  1.5 L   Suzen HUNT RD, LDN, CNSC Contact via secure chat. If unavailable, use group chat RD Inpatient.

## 2024-05-08 NOTE — Care Management Important Message (Signed)
 Important Message  Patient Details  Name: Catherine West MRN: [id number] Date of Birth: 1921/01/04   Important Message Given:  Yes - Medicare IM     Earma Nicolaou L Bayan Kushnir 05/08/2024, 2:04 PM

## 2024-05-08 NOTE — Plan of Care (Signed)

## 2024-05-08 NOTE — Discharge Summary (Signed)
 Physician Discharge Summary  WALTER MIN FMW:[id number] DOB: 05/05/1920 DOA: 04/28/2024  PCP: Shona Norleen PEDLAR, MD  Admit date: 04/28/2024 Discharge date: 05/08/2024  Disposition:  Long term Care  Recommendations for Outpatient Follow-up:  Please arrange for hospice services  Please check BMP in 1 week  Discharge Condition: STABLE   CODE STATUS: DNR DIET:  Dysphagia 2, thin liquids, assist with feeding, aspiration precautions   Brief Hospitalization Summary: Please see all hospital notes, images, labs for full details of the hospitalization. Brief Admission History:  89 y.o. female with medical history significant of HTN, HLD, hypothyroidism, depression, CKD 3,  HFrEF (echo 09/2022 EF 35-40%) who presents to the emergency department due to fall when patient rolled out of bed and sustained a bruise on her forehead causing hematoma.  Oxygen  level was also noted to drop into the 80s on room air, chest x-ray was suspicious for RML pneumonia.    Hospital Course by listed problems addressed   Acute hypoxic respiratory failure - Initially requiring 2 L nasal cannula to maintain O2 sats in the 90s.  Intermittently room down to room air.   Acute right middle lobe pneumonia-- COMPLETED TREATMENT - Suspect aspiration pneumonia.  Noted cough and mild hypoxia.  DC unasyn /azithromycin .  Augmentin  x 2 more days to complete course thru 05/05/24    Hypokalemia - TREATED - this was repleted, and it is improved   Acute on chronic HFrEF -she has diuresed well on IV lasix , resume home oral diuretic   Leukocytosis -- secondary to pneumonia -- WBC trended down with treatments   AKI on CKD stage 3b  - renal function stabilized now, she likely has stage 3b CKD  -- creatinine bumped 1/2 likely from dehydration, hold diuretic, -- gently hydrated 1/4.  -- recheck BMP in 1 week recommended   Dysphagia -- requested SLP evaluation -- started dysphagia 2 diet    Hypertension -  losartan  100 mg daily  and amlodipine  5 mg.   Hypothyroidism/hyperlipidemia/depression - resumed home medication regiment.  Discharge Diagnoses:  Principal Problem:   Acute hypoxic respiratory failure (HCC) Active Problems:   Acute on chronic heart failure with reduced ejection fraction (HFrEF, <= 40%) (HCC)   Acute kidney injury superimposed on chronic kidney disease   Aspiration pneumonia (HCC)   Pressure injury of skin   Protein-calorie malnutrition, severe   Discharge Instructions:  Allergies as of 05/08/2024   No Known Allergies      Medication List     STOP taking these medications    furosemide  20 MG tablet Commonly known as: LASIX    Geri-Tussin 100 MG/5ML liquid Generic drug: guaiFENesin        TAKE these medications    acetaminophen  650 MG CR tablet Commonly known as: TYLENOL  Take 650 mg by mouth every 8 (eight) hours as needed for pain.   amLODipine  5 MG tablet Commonly known as: NORVASC  Take 5 mg by mouth every morning.   Baza Protect Moisture Barrier 12 % Crea Generic drug: Zinc  Oxide Apply 1 application  topically 2 (two) times daily. Apply to buttocks topically two times a day for skin integrity. Apply thin layer over affected area, until healed.   bumetanide  1 MG tablet Commonly known as: BUMEX  Take 1 tablet (1 mg total) by mouth every other day. Start taking on: May 10, 2024 What changed: when to take this   busPIRone  5 MG tablet Commonly known as: BUSPAR  Take 5 mg by mouth 2 (two) times daily.   CAMPHOR-MENTHOL  EX Apply 1  application  topically 2 (two) times daily. Sama External Lotion 0.5%-0.5%. Apply to whole body topically two times a day for itching   cyanocobalamin  1000 MCG tablet Commonly known as: VITAMIN B12 Take 1 tablet (1,000 mcg total) by mouth daily.   Ensure Original Liqd Take 1 Bottle by mouth 2 (two) times daily.   famotidine  20 MG tablet Commonly known as: PEPCID  Take 1 tablet (20 mg total) by mouth at bedtime.   ferrous sulfate   325 (65 FE) MG EC tablet Take 1 tablet (325 mg total) by mouth daily with breakfast.   folic acid  1 MG tablet Commonly known as: FOLVITE  Take 1 tablet (1 mg total) by mouth daily. What changed: when to take this   loperamide 2 MG tablet Commonly known as: IMODIUM A-D Take 2 mg by mouth every 6 (six) hours as needed for diarrhea or loose stools.   losartan  100 MG tablet Commonly known as: COZAAR  Take 100 mg by mouth every morning.   multivitamin with minerals Tabs tablet Take 1 tablet by mouth daily. Start taking on: May 09, 2024   OXYGEN  Inhale 2 L/min into the lungs as needed (shortness of breath).   polyethylene glycol 17 g packet Commonly known as: MIRALAX  / GLYCOLAX  Take 17 g by mouth daily as needed.   potassium chloride  10 MEQ tablet Commonly known as: KLOR-CON  M Take 1 tablet (10 mEq total) by mouth every morning. What changed:  medication strength how much to take   Preparation H 0.25-14-74.9 % rectal ointment Generic drug: phenylephrine-shark liver oil-mineral oil-petrolatum Place 1 Application rectally 2 (two) times daily.   sertraline  100 MG tablet Commonly known as: ZOLOFT  Take 100 mg by mouth every morning.   Ventolin  HFA 108 (90 Base) MCG/ACT inhaler Generic drug: albuterol  Inhale 2 puffs into the lungs every 4 (four) hours as needed for wheezing or shortness of breath.   albuterol  (2.5 MG/3ML) 0.083% nebulizer solution Commonly known as: PROVENTIL  Take 3 mLs (2.5 mg total) by nebulization every 4 (four) hours as needed for wheezing or shortness of breath.   Vitamin D  50 MCG (2000 UT) Caps Take 2,000 Units by mouth every morning.        Contact information for after-discharge care     Destination     [facility] .   Service: Skilled Nursing Contact information: [address] [place]  [address] [phone]                    Allergies[1] Allergies as of 05/08/2024   No Known Allergies       Medication List     STOP taking these medications    furosemide  20 MG tablet Commonly known as: LASIX    Geri-Tussin 100 MG/5ML liquid Generic drug: guaiFENesin        TAKE these medications    acetaminophen  650 MG CR tablet Commonly known as: TYLENOL  Take 650 mg by mouth every 8 (eight) hours as needed for pain.   amLODipine  5 MG tablet Commonly known as: NORVASC  Take 5 mg by mouth every morning.   Baza Protect Moisture Barrier 12 % Crea Generic drug: Zinc  Oxide Apply 1 application  topically 2 (two) times daily. Apply to buttocks topically two times a day for skin integrity. Apply thin layer over affected area, until healed.   bumetanide  1 MG tablet Commonly known as: BUMEX  Take 1 tablet (1 mg total) by mouth every other day. Start taking on: May 10, 2024 What changed: when to take  this   busPIRone  5 MG tablet Commonly known as: BUSPAR  Take 5 mg by mouth 2 (two) times daily.   CAMPHOR-MENTHOL  EX Apply 1 application  topically 2 (two) times daily. Sama External Lotion 0.5%-0.5%. Apply to whole body topically two times a day for itching   cyanocobalamin  1000 MCG tablet Commonly known as: VITAMIN B12 Take 1 tablet (1,000 mcg total) by mouth daily.   Ensure Original Liqd Take 1 Bottle by mouth 2 (two) times daily.   famotidine  20 MG tablet Commonly known as: PEPCID  Take 1 tablet (20 mg total) by mouth at bedtime.   ferrous sulfate  325 (65 FE) MG EC tablet Take 1 tablet (325 mg total) by mouth daily with breakfast.   folic acid  1 MG tablet Commonly known as: FOLVITE  Take 1 tablet (1 mg total) by mouth daily. What changed: when to take this   loperamide 2 MG tablet Commonly known as: IMODIUM A-D Take 2 mg by mouth every 6 (six) hours as needed for diarrhea or loose stools.   losartan  100 MG tablet Commonly known as: COZAAR  Take 100 mg by mouth every morning.   multivitamin with minerals Tabs tablet Take 1 tablet by mouth daily. Start taking on:  May 09, 2024   OXYGEN  Inhale 2 L/min into the lungs as needed (shortness of breath).   polyethylene glycol 17 g packet Commonly known as: MIRALAX  / GLYCOLAX  Take 17 g by mouth daily as needed.   potassium chloride  10 MEQ tablet Commonly known as: KLOR-CON  M Take 1 tablet (10 mEq total) by mouth every morning. What changed:  medication strength how much to take   Preparation H 0.25-14-74.9 % rectal ointment Generic drug: phenylephrine-shark liver oil-mineral oil-petrolatum Place 1 Application rectally 2 (two) times daily.   sertraline  100 MG tablet Commonly known as: ZOLOFT  Take 100 mg by mouth every morning.   Ventolin  HFA 108 (90 Base) MCG/ACT inhaler Generic drug: albuterol  Inhale 2 puffs into the lungs every 4 (four) hours as needed for wheezing or shortness of breath.   albuterol  (2.5 MG/3ML) 0.083% nebulizer solution Commonly known as: PROVENTIL  Take 3 mLs (2.5 mg total) by nebulization every 4 (four) hours as needed for wheezing or shortness of breath.   Vitamin D  50 MCG (2000 UT) Caps Take 2,000 Units by mouth every morning.        Procedures/Studies: DG Chest 2 View Result Date: 04/29/2024 EXAM: 2 VIEW(S) XRAY OF THE CHEST 04/28/2024 11:43:27 PM COMPARISON: 10/29/2023 CLINICAL HISTORY: Eval for cough. FINDINGS: LUNGS AND PLEURA: Stable chronic interstitial markings. There are few nodular densities in the lateral right mid lung, slightly increased. No pleural effusion. No pneumothorax. HEART AND MEDIASTINUM: There are atherosclerotic calcifications of the aorta. No acute abnormality of the cardiac and mediastinal silhouettes. BONES AND SOFT TISSUES: Stable compression fracture in upper thoracic spine. IMPRESSION: 1. Patchy/nodular densities in the lateral right mid lung have slightly increased. These are indeterminate and may represent an infectious/inflammatory process, but a pulmonary nodule cannot be excluded. Recommend follow-up chest CT or short-term follow-up  chest x-ray when acute symptoms resolve to reevaluate. Electronically signed by: Greig Pique MD 04/29/2024 12:30 AM EST RP Workstation: [id number]   CT Head Wo Contrast Result Date: 04/28/2024 EXAM: CT HEAD AND CERVICAL SPINE 04/28/2024 10:47:34 PM TECHNIQUE: CT of the head and cervical spine was performed without the administration of intravenous contrast. Multiplanar reformatted images are provided for review. Automated exposure control, iterative reconstruction, and/or weight based adjustment of the mA/kV was utilized to reduce the  radiation dose to as low as reasonably achievable. COMPARISON: None available. CLINICAL HISTORY: Head trauma, minor (Age >= 65y) FINDINGS: CT HEAD BRAIN AND VENTRICLES: No acute intracranial hemorrhage. No mass effect or midline shift. No abnormal extra-axial fluid collection. No evidence of acute infarct. No hydrocephalus. Patchy white matter hypodensities are compatible with chronic microvascular ischemic disease. Cerebral atrophy. Calcific atherosclerosis. ORBITS: No acute abnormality. SINUSES AND MASTOIDS: No acute abnormality. SOFT TISSUES AND SKULL: No acute skull fracture. No acute soft tissue abnormality. CT CERVICAL SPINE BONES AND ALIGNMENT: No acute fracture or traumatic malalignment. Approximately 1-2 mm anterolisthesis of C4 on C5 and retrolisthesis of C5 on C6. DEGENERATIVE CHANGES: C5-C6 degenerative disc disease with disc height loss and endplate spurring. Facet and uncovertebral arthropathy with varying degrees of foraminal stenosis. SOFT TISSUES: No prevertebral soft tissue swelling. IMPRESSION: 1. No acute intracranial abnormality. 2. No acute fracture or traumatic malalignment of the cervical spine. Electronically signed by: Gilmore Molt 04/28/2024 11:21 PM EST RP Workstation: [id number]   CT Cervical Spine Wo Contrast Result Date: 04/28/2024 EXAM: CT HEAD AND CERVICAL SPINE 04/28/2024 10:47:34 PM TECHNIQUE: CT of the head and cervical spine was  performed without the administration of intravenous contrast. Multiplanar reformatted images are provided for review. Automated exposure control, iterative reconstruction, and/or weight based adjustment of the mA/kV was utilized to reduce the radiation dose to as low as reasonably achievable. COMPARISON: None available. CLINICAL HISTORY: Head trauma, minor (Age >= 65y) FINDINGS: CT HEAD BRAIN AND VENTRICLES: No acute intracranial hemorrhage. No mass effect or midline shift. No abnormal extra-axial fluid collection. No evidence of acute infarct. No hydrocephalus. Patchy white matter hypodensities are compatible with chronic microvascular ischemic disease. Cerebral atrophy. Calcific atherosclerosis. ORBITS: No acute abnormality. SINUSES AND MASTOIDS: No acute abnormality. SOFT TISSUES AND SKULL: No acute skull fracture. No acute soft tissue abnormality. CT CERVICAL SPINE BONES AND ALIGNMENT: No acute fracture or traumatic malalignment. Approximately 1-2 mm anterolisthesis of C4 on C5 and retrolisthesis of C5 on C6. DEGENERATIVE CHANGES: C5-C6 degenerative disc disease with disc height loss and endplate spurring. Facet and uncovertebral arthropathy with varying degrees of foraminal stenosis. SOFT TISSUES: No prevertebral soft tissue swelling. IMPRESSION: 1. No acute intracranial abnormality. 2. No acute fracture or traumatic malalignment of the cervical spine. Electronically signed by: Gilmore Molt 04/28/2024 11:21 PM EST RP Workstation: [id number]     Subjective: No complaints.   Discharge Exam: Vitals:   05/08/24 0440 05/08/24 1207  BP: 137/66 (!) 146/58  Pulse: (!) 56 60  Resp: 18   Temp: 98.4 F (36.9 C) 98.2 F (36.8 C)  SpO2: 96% 96%   Vitals:   05/07/24 2018 05/08/24 0440 05/08/24 0500 05/08/24 1207  BP: 135/75 137/66  (!) 146/58  Pulse: 63 (!) 56  60  Resp: 20 18    Temp: 98.4 F (36.9 C) 98.4 F (36.9 C)  98.2 F (36.8 C)  TempSrc:    Oral  SpO2: 95% 96%  96%  Weight:   52.6 kg    Height:        General: Pt is alert, elderly, frail, awake, not in acute distress Cardiovascular: normal S1/S2 +, no rubs, no gallops Respiratory: CTA bilaterally, no wheezing, no rhonchi Abdominal: Soft, NT, ND, bowel sounds + Extremities: no edema, no cyanosis   The results of significant diagnostics from this hospitalization (including imaging, microbiology, ancillary and laboratory) are listed below for reference.     Microbiology: Recent Results (from the past 240 hours)  Resp panel by RT-PCR (RSV,  Flu A&B, Covid) Anterior Nasal Swab     Status: None   Collection Time: 04/29/24 12:05 AM   Specimen: Anterior Nasal Swab  Result Value Ref Range Status   SARS Coronavirus 2 by RT PCR NEGATIVE NEGATIVE Final    Comment: (NOTE) SARS-CoV-2 target nucleic acids are NOT DETECTED.  The SARS-CoV-2 RNA is generally detectable in upper respiratory specimens during the acute phase of infection. The lowest concentration of SARS-CoV-2 viral copies this assay can detect is 138 copies/mL. A negative result does not preclude SARS-Cov-2 infection and should not be used as the sole basis for treatment or other patient management decisions. A negative result may occur with  improper specimen collection/handling, submission of specimen other than nasopharyngeal swab, presence of viral mutation(s) within the areas targeted by this assay, and inadequate number of viral copies(<138 copies/mL). A negative result must be combined with clinical observations, patient history, and epidemiological information. The expected result is Negative.  Fact Sheet for Patients:  bloggercourse.com  Fact Sheet for Healthcare Providers:  seriousbroker.it  This test is no t yet approved or cleared by the [place]  FDA and  has been authorized for detection and/or diagnosis of SARS-CoV-2 by FDA under an Emergency Use Authorization (EUA). This EUA will remain   in effect (meaning this test can be used) for the duration of the COVID-19 declaration under Section 564(b)(1) of the Act, 21 U.S.C.section 360bbb-3(b)(1), unless the authorization is terminated  or revoked sooner.       Influenza A by PCR NEGATIVE NEGATIVE Final   Influenza B by PCR NEGATIVE NEGATIVE Final    Comment: (NOTE) The Xpert Xpress SARS-CoV-2/FLU/RSV plus assay is intended as an aid in the diagnosis of influenza from Nasopharyngeal swab specimens and should not be used as a sole basis for treatment. Nasal washings and aspirates are unacceptable for Xpert Xpress SARS-CoV-2/FLU/RSV testing.  Fact Sheet for Patients: bloggercourse.com  Fact Sheet for Healthcare Providers: seriousbroker.it  This test is not yet approved or cleared by the [place]  FDA and has been authorized for detection and/or diagnosis of SARS-CoV-2 by FDA under an Emergency Use Authorization (EUA). This EUA will remain in effect (meaning this test can be used) for the duration of the COVID-19 declaration under Section 564(b)(1) of the Act, 21 U.S.C. section 360bbb-3(b)(1), unless the authorization is terminated or revoked.     Resp Syncytial Virus by PCR NEGATIVE NEGATIVE Final    Comment: (NOTE) Fact Sheet for Patients: bloggercourse.com  Fact Sheet for Healthcare Providers: seriousbroker.it  This test is not yet approved or cleared by the [place]  FDA and has been authorized for detection and/or diagnosis of SARS-CoV-2 by FDA under an Emergency Use Authorization (EUA). This EUA will remain in effect (meaning this test can be used) for the duration of the COVID-19 declaration under Section 564(b)(1) of the Act, 21 U.S.C. section 360bbb-3(b)(1), unless the authorization is terminated or revoked.  Performed at [facility], [address], [place], [place] [address]   Blood culture  (routine x 2)     Status: None   Collection Time: 04/29/24 12:58 AM   Specimen: Right Antecubital; Blood  Result Value Ref Range Status   Specimen Description RIGHT ANTECUBITAL  Final   Special Requests   Final    BOTTLES DRAWN AEROBIC AND ANAEROBIC Blood Culture adequate volume   Culture   Final    NO GROWTH 5 DAYS Performed at [facility], [address], [place], [place] [address]    Report Status 05/04/2024  FINAL  Final  Blood culture (routine x 2)     Status: None   Collection Time: 04/29/24  1:01 AM   Specimen: Left Antecubital; Blood  Result Value Ref Range Status   Specimen Description LEFT ANTECUBITAL  Final   Special Requests   Final    BOTTLES DRAWN AEROBIC AND ANAEROBIC Blood Culture adequate volume   Culture   Final    NO GROWTH 5 DAYS Performed at [facility], [address] [place], [place] [address]    Report Status 05/04/2024 FINAL  Final  Resp panel by RT-PCR (RSV, Flu A&B, Covid) Anterior Nasal Swab     Status: None   Collection Time: 05/02/24 12:37 PM   Specimen: Anterior Nasal Swab  Result Value Ref Range Status   SARS Coronavirus 2 by RT PCR NEGATIVE NEGATIVE Final    Comment: (NOTE) SARS-CoV-2 target nucleic acids are NOT DETECTED.  The SARS-CoV-2 RNA is generally detectable in upper respiratory specimens during the acute phase of infection. The lowest concentration of SARS-CoV-2 viral copies this assay can detect is 138 copies/mL. A negative result does not preclude SARS-Cov-2 infection and should not be used as the sole basis for treatment or other patient management decisions. A negative result may occur with  improper specimen collection/handling, submission of specimen other than nasopharyngeal swab, presence of viral mutation(s) within the areas targeted by this assay, and inadequate number of viral copies(<138 copies/mL). A negative result must be combined with clinical observations, patient history, and epidemiological information. The  expected result is Negative.  Fact Sheet for Patients:  bloggercourse.com  Fact Sheet for Healthcare Providers:  seriousbroker.it  This test is no t yet approved or cleared by the [place]  FDA and  has been authorized for detection and/or diagnosis of SARS-CoV-2 by FDA under an Emergency Use Authorization (EUA). This EUA will remain  in effect (meaning this test can be used) for the duration of the COVID-19 declaration under Section 564(b)(1) of the Act, 21 U.S.C.section 360bbb-3(b)(1), unless the authorization is terminated  or revoked sooner.       Influenza A by PCR NEGATIVE NEGATIVE Final   Influenza B by PCR NEGATIVE NEGATIVE Final    Comment: (NOTE) The Xpert Xpress SARS-CoV-2/FLU/RSV plus assay is intended as an aid in the diagnosis of influenza from Nasopharyngeal swab specimens and should not be used as a sole basis for treatment. Nasal washings and aspirates are unacceptable for Xpert Xpress SARS-CoV-2/FLU/RSV testing.  Fact Sheet for Patients: bloggercourse.com  Fact Sheet for Healthcare Providers: seriousbroker.it  This test is not yet approved or cleared by the [place]  FDA and has been authorized for detection and/or diagnosis of SARS-CoV-2 by FDA under an Emergency Use Authorization (EUA). This EUA will remain in effect (meaning this test can be used) for the duration of the COVID-19 declaration under Section 564(b)(1) of the Act, 21 U.S.C. section 360bbb-3(b)(1), unless the authorization is terminated or revoked.     Resp Syncytial Virus by PCR NEGATIVE NEGATIVE Final    Comment: (NOTE) Fact Sheet for Patients: bloggercourse.com  Fact Sheet for Healthcare Providers: seriousbroker.it  This test is not yet approved or cleared by the [place]  FDA and has been authorized for detection and/or  diagnosis of SARS-CoV-2 by FDA under an Emergency Use Authorization (EUA). This EUA will remain in effect (meaning this test can be used) for the duration of the COVID-19 declaration under Section 564(b)(1) of the Act, 21 U.S.C. section 360bbb-3(b)(1), unless the authorization is terminated or revoked.  Performed at [facility], [address], [place], [place] [address]      Labs: BNP (last 3 results) No results for input(s): BNP in the last 8760 hours. Basic Metabolic Panel: Recent Labs  Lab 05/02/24 0425 05/03/24 0806 05/04/24 0426 05/06/24 0436  NA 142 139 138 142  K 2.9* 3.3* 3.7 3.3*  CL 100 99 98 100  CO2 34* 26 35* 35*  GLUCOSE 87 98 86 87  BUN 18 16 18  29*  CREATININE 1.15* 1.11* 1.14* 1.47*  CALCIUM 8.7* 8.7* 8.9 8.5*  MG 1.9 2.0  --   --    Liver Function Tests: No results for input(s): AST, ALT, ALKPHOS, BILITOT, PROT, ALBUMIN in the last 168 hours. No results for input(s): LIPASE, AMYLASE in the last 168 hours. No results for input(s): AMMONIA in the last 168 hours. CBC: Recent Labs  Lab 05/02/24 0425  WBC 11.5*  HGB 10.4*  HCT 32.1*  MCV 97.6  PLT 241   Cardiac Enzymes: No results for input(s): CKTOTAL, CKMB, CKMBINDEX, TROPONINI in the last 168 hours. BNP: Invalid input(s): POCBNP CBG: No results for input(s): GLUCAP in the last 168 hours. D-Dimer No results for input(s): DDIMER in the last 72 hours. Hgb A1c No results for input(s): HGBA1C in the last 72 hours. Lipid Profile No results for input(s): CHOL, HDL, LDLCALC, TRIG, CHOLHDL, LDLDIRECT in the last 72 hours. Thyroid  function studies No results for input(s): TSH, T4TOTAL, T3FREE, THYROIDAB in the last 72 hours.  Invalid input(s): FREET3 Anemia work up No results for input(s): VITAMINB12, FOLATE, FERRITIN, TIBC, IRON, RETICCTPCT in the last 72 hours. Urinalysis    Component Value Date/Time   COLORURINE YELLOW  04/29/2024 0144   APPEARANCEUR HAZY (A) 04/29/2024 0144   LABSPEC 1.010 04/29/2024 0144   PHURINE 5.0 04/29/2024 0144   GLUCOSEU NEGATIVE 04/29/2024 0144   HGBUR NEGATIVE 04/29/2024 0144   BILIRUBINUR NEGATIVE 04/29/2024 0144   KETONESUR NEGATIVE 04/29/2024 0144   PROTEINUR NEGATIVE 04/29/2024 0144   NITRITE NEGATIVE 04/29/2024 0144   LEUKOCYTESUR MODERATE (A) 04/29/2024 0144   Sepsis Labs Recent Labs  Lab 05/02/24 0425  WBC 11.5*   Microbiology Recent Results (from the past 240 hours)  Resp panel by RT-PCR (RSV, Flu A&B, Covid) Anterior Nasal Swab     Status: None   Collection Time: 04/29/24 12:05 AM   Specimen: Anterior Nasal Swab  Result Value Ref Range Status   SARS Coronavirus 2 by RT PCR NEGATIVE NEGATIVE Final    Comment: (NOTE) SARS-CoV-2 target nucleic acids are NOT DETECTED.  The SARS-CoV-2 RNA is generally detectable in upper respiratory specimens during the acute phase of infection. The lowest concentration of SARS-CoV-2 viral copies this assay can detect is 138 copies/mL. A negative result does not preclude SARS-Cov-2 infection and should not be used as the sole basis for treatment or other patient management decisions. A negative result may occur with  improper specimen collection/handling, submission of specimen other than nasopharyngeal swab, presence of viral mutation(s) within the areas targeted by this assay, and inadequate number of viral copies(<138 copies/mL). A negative result must be combined with clinical observations, patient history, and epidemiological information. The expected result is Negative.  Fact Sheet for Patients:  bloggercourse.com  Fact Sheet for Healthcare Providers:  seriousbroker.it  This test is no t yet approved or cleared by the [place]  FDA and  has been authorized for detection and/or diagnosis of SARS-CoV-2 by FDA under an Emergency Use Authorization (EUA). This EUA  will remain  in effect (meaning this test can be used) for the duration of the COVID-19 declaration under Section 564(b)(1) of the Act, 21 U.S.C.section 360bbb-3(b)(1), unless the authorization is terminated  or revoked sooner.       Influenza A by PCR NEGATIVE NEGATIVE Final   Influenza B by PCR NEGATIVE NEGATIVE Final    Comment: (NOTE) The Xpert Xpress SARS-CoV-2/FLU/RSV plus assay is intended as an aid in the diagnosis of influenza from Nasopharyngeal swab specimens and should not be used as a sole basis for treatment. Nasal washings and aspirates are unacceptable for Xpert Xpress SARS-CoV-2/FLU/RSV testing.  Fact Sheet for Patients: bloggercourse.com  Fact Sheet for Healthcare Providers: seriousbroker.it  This test is not yet approved or cleared by the [place]  FDA and has been authorized for detection and/or diagnosis of SARS-CoV-2 by FDA under an Emergency Use Authorization (EUA). This EUA will remain in effect (meaning this test can be used) for the duration of the COVID-19 declaration under Section 564(b)(1) of the Act, 21 U.S.C. section 360bbb-3(b)(1), unless the authorization is terminated or revoked.     Resp Syncytial Virus by PCR NEGATIVE NEGATIVE Final    Comment: (NOTE) Fact Sheet for Patients: bloggercourse.com  Fact Sheet for Healthcare Providers: seriousbroker.it  This test is not yet approved or cleared by the [place]  FDA and has been authorized for detection and/or diagnosis of SARS-CoV-2 by FDA under an Emergency Use Authorization (EUA). This EUA will remain in effect (meaning this test can be used) for the duration of the COVID-19 declaration under Section 564(b)(1) of the Act, 21 U.S.C. section 360bbb-3(b)(1), unless the authorization is terminated or revoked.  Performed at [facility], [address], [place], [place] [address]   Blood  culture (routine x 2)     Status: None   Collection Time: 04/29/24 12:58 AM   Specimen: Right Antecubital; Blood  Result Value Ref Range Status   Specimen Description RIGHT ANTECUBITAL  Final   Special Requests   Final    BOTTLES DRAWN AEROBIC AND ANAEROBIC Blood Culture adequate volume   Culture   Final    NO GROWTH 5 DAYS Performed at [facility], [address] [place], [place] [address]    Report Status 05/04/2024 FINAL  Final  Blood culture (routine x 2)     Status: None   Collection Time: 04/29/24  1:01 AM   Specimen: Left Antecubital; Blood  Result Value Ref Range Status   Specimen Description LEFT ANTECUBITAL  Final   Special Requests   Final    BOTTLES DRAWN AEROBIC AND ANAEROBIC Blood Culture adequate volume   Culture   Final    NO GROWTH 5 DAYS Performed at [facility], [address], [place], [place] [address]    Report Status 05/04/2024 FINAL  Final  Resp panel by RT-PCR (RSV, Flu A&B, Covid) Anterior Nasal Swab     Status: None   Collection Time: 05/02/24 12:37 PM   Specimen: Anterior Nasal Swab  Result Value Ref Range Status   SARS Coronavirus 2 by RT PCR NEGATIVE NEGATIVE Final    Comment: (NOTE) SARS-CoV-2 target nucleic acids are NOT DETECTED.  The SARS-CoV-2 RNA is generally detectable in upper respiratory specimens during the acute phase of infection. The lowest concentration of SARS-CoV-2 viral copies this assay can detect is 138 copies/mL. A negative result does not preclude SARS-Cov-2 infection and should not be used as the sole basis for treatment or other patient management decisions. A negative result may occur with  improper  specimen collection/handling, submission of specimen other than nasopharyngeal swab, presence of viral mutation(s) within the areas targeted by this assay, and inadequate number of viral copies(<138 copies/mL). A negative result must be combined with clinical observations, patient history, and epidemiological information.  The expected result is Negative.  Fact Sheet for Patients:  bloggercourse.com  Fact Sheet for Healthcare Providers:  seriousbroker.it  This test is no t yet approved or cleared by the [place]  FDA and  has been authorized for detection and/or diagnosis of SARS-CoV-2 by FDA under an Emergency Use Authorization (EUA). This EUA will remain  in effect (meaning this test can be used) for the duration of the COVID-19 declaration under Section 564(b)(1) of the Act, 21 U.S.C.section 360bbb-3(b)(1), unless the authorization is terminated  or revoked sooner.       Influenza A by PCR NEGATIVE NEGATIVE Final   Influenza B by PCR NEGATIVE NEGATIVE Final    Comment: (NOTE) The Xpert Xpress SARS-CoV-2/FLU/RSV plus assay is intended as an aid in the diagnosis of influenza from Nasopharyngeal swab specimens and should not be used as a sole basis for treatment. Nasal washings and aspirates are unacceptable for Xpert Xpress SARS-CoV-2/FLU/RSV testing.  Fact Sheet for Patients: bloggercourse.com  Fact Sheet for Healthcare Providers: seriousbroker.it  This test is not yet approved or cleared by the [place]  FDA and has been authorized for detection and/or diagnosis of SARS-CoV-2 by FDA under an Emergency Use Authorization (EUA). This EUA will remain in effect (meaning this test can be used) for the duration of the COVID-19 declaration under Section 564(b)(1) of the Act, 21 U.S.C. section 360bbb-3(b)(1), unless the authorization is terminated or revoked.     Resp Syncytial Virus by PCR NEGATIVE NEGATIVE Final    Comment: (NOTE) Fact Sheet for Patients: bloggercourse.com  Fact Sheet for Healthcare Providers: seriousbroker.it  This test is not yet approved or cleared by the [place]  FDA and has been authorized for detection and/or  diagnosis of SARS-CoV-2 by FDA under an Emergency Use Authorization (EUA). This EUA will remain in effect (meaning this test can be used) for the duration of the COVID-19 declaration under Section 564(b)(1) of the Act, 21 U.S.C. section 360bbb-3(b)(1), unless the authorization is terminated or revoked.  Performed at [facility], [address], [place], [place] [address]    Time coordinating discharge: 38 mins  SIGNED:  Afton Louder, MD  [facility] 05/08/2024, 1:23 PM How to contact the [facility] Attending or Consulting provider 7A - 7P or covering provider during after hours 7P -7A, for this patient?  Check the care team in [facility] and look for a) attending/consulting TRH provider listed and b) the TRH team listed Log into [email or web address] and use [place]'s universal password to access. If you do not have the password, please contact the hospital operator. Locate the TRH provider you are looking for under [facility] and page to a number that you can be directly reached. If you still have difficulty reaching the provider, please page the [facility] (Director on Call) for the Hospitalists listed on amion for assistance.     [1] No Known Allergies

## 2024-05-08 NOTE — TOC Transition Note (Signed)
 Transition of Care [facility]) - Discharge Note   Patient Details  Name: Catherine West MRN: [id number] Date of Birth: November 18, 1920  [facility] CM/SW Contact:  Mcarthur Saddie Kim, LCSW Phone Number: 05/08/2024, 3:44 PM   Clinical Narrative: Pt d/c today to [facility]. Pt's daughter and facility aware and agreeable. Pt will transfer with staff. D/C summary sent to SNF. RN given number to call report.       Final next level of care: Skilled Nursing Facility Barriers to Discharge: Barriers Resolved   Patient Goals and CMS Choice Patient states their goals for this hospitalization and ongoing recovery are:: return to ALF CMS Medicare.gov Compare Post Acute Care list provided to:: Patient Represenative (must comment) Choice offered to / list presented to : Patient, Adult Children      Discharge Placement              Patient chooses bed at: [facility] Patient to be transferred to facility by: staff Name of family member notified: daughter Patient and family notified of of transfer: 05/08/24  Discharge Plan and Services Additional resources added to the After Visit Summary for   In-house Referral: Clinical Social Work Discharge Planning Services: CM Consult Post Acute Care Choice: Resumption of Svcs/PTA Provider                               Social Drivers of Health (SDOH) Interventions SDOH Screenings   Food Insecurity: Patient Unable To Answer (04/29/2024)  Housing: Patient Unable To Answer (04/29/2024)  Transportation Needs: Patient Unable To Answer (04/29/2024)  Utilities: Patient Unable To Answer (04/29/2024)  Social Connections: Patient Unable To Answer (04/29/2024)  Tobacco Use: Low Risk (04/28/2024)     Readmission Risk Interventions    05/01/2024    3:20 PM  Readmission Risk Prevention Plan  Transportation Screening Complete  Home Care Screening Complete  Medication Review (RN CM) Complete

## 2024-05-08 NOTE — Progress Notes (Signed)
 Report given to [facility] RN at [facility] Patient going to room 119.

## 2024-05-08 NOTE — Plan of Care (Signed)

## 2024-05-09 ENCOUNTER — Encounter: Payer: Self-pay | Admitting: Internal Medicine

## 2024-05-09 ENCOUNTER — Non-Acute Institutional Stay (SKILLED_NURSING_FACILITY): Payer: Self-pay | Admitting: Internal Medicine

## 2024-05-09 DIAGNOSIS — E039 Hypothyroidism, unspecified: Secondary | ICD-10-CM

## 2024-05-09 DIAGNOSIS — I5023 Acute on chronic systolic (congestive) heart failure: Secondary | ICD-10-CM

## 2024-05-09 DIAGNOSIS — J690 Pneumonitis due to inhalation of food and vomit: Secondary | ICD-10-CM | POA: Diagnosis not present

## 2024-05-09 DIAGNOSIS — N189 Chronic kidney disease, unspecified: Secondary | ICD-10-CM | POA: Diagnosis not present

## 2024-05-09 DIAGNOSIS — R4189 Other symptoms and signs involving cognitive functions and awareness: Secondary | ICD-10-CM | POA: Diagnosis not present

## 2024-05-09 DIAGNOSIS — N179 Acute kidney failure, unspecified: Secondary | ICD-10-CM

## 2024-05-09 DIAGNOSIS — R29818 Other symptoms and signs involving the nervous system: Secondary | ICD-10-CM | POA: Diagnosis not present

## 2024-05-09 DIAGNOSIS — E43 Unspecified severe protein-calorie malnutrition: Secondary | ICD-10-CM

## 2024-05-09 NOTE — Assessment & Plan Note (Addendum)
 Current creatinine 1.47 with GFR of 31 indicating low stage IIIb CKD; peak recorded GFR value was 44.  .  Med list reviewed; no indication change in meds or dosages at this time.  Continue to monitor renal function.

## 2024-05-09 NOTE — Progress Notes (Signed)
 "   NURSING HOME LOCATION:  [facility] ROOM NUMBER: 119P  CODE STATUS: DNR  PCP: Norleen PEDLAR. Shona, MD  This is a comprehensive admission note to this SNFperformed on this date less than 30 days from date of admission. Included are preadmission medical/surgical history; reconciled medication list; family history; social history and comprehensive review of systems.  Corrections and additions to the records were documented. Comprehensive physical exam was also performed. Additionally a clinical summary was entered for each active diagnosis pertinent to this admission in the Problem List to enhance continuity of care.  HPI: She was hospitalized 04/28/2024 - 05/08/2024 presenting to the ED after a fall, having rolled out of bed.  She had struck her head; hematoma was documented over the forehead hypoxia was present with O2 sats in the 80s on room air.Two L nasal cannula was necessary to maintain O2 sats in the 90s. Chest x-ray suggested possible right middle lobe pneumonia.  Leukocytosis was present with peak white count of 22,200. In the context of documented dysphagia , aspiration etiology was suspect & empirically she received Unasyn  and azithromycin .  She was transitioned to Augmentin  for 2 additional days with course completion through 05/05/2024.  Dysphagia 2 diet was continued. Acute on chronic heart failure with reduced ejection fraction was present; pro brain natruretic peptide was markedly elevated at 33,008.0. Clinically this responded to IV diuresis with subsequent transition to oral diuretic. AKI superimposed on CKD stage III was thought to be present; this responded to gentle rehydration.  Initial creatinine was 1.46; it did improve to 1.11 but final value was 1.47.  Initial GFR was 32; peak recorded GFR was 44.  At discharge value was 31 indicating low stage IIIb CKD. Albumin was 3.1; total protein was normal at 7.  Initial glucose was 122 but otherwise all values were less than  100. TSH was therapeutic at 2.540.  Past medical history includes hypothyroidism but no thyroid  supplement is listed on med list. Normochromic normocytic anemia was present with initial H/H of 10.8/33.4 and final H/H of 10.4/32.1. PT/OT consulted and recommended SNF placement for rehab.  Past medical and surgical history: Includes essential hypertension; dyslipidemia; hypothyroidism; osteoporosis; and history of UTIs.  No significant surgeries or procedures are documented in the past medical record.  Family history: reviewed, non contributory due to advanced age.  Social history:non drinker; non smoker.   Review of systems could not be completed due to severe neurocognitive deficits.  When asked why she had been hospitalized, her response was so far so good.  When I asked why she went to the hospital her response was because I was sick.  At this time she states nothing wrong, I am fine no ailments.  At that point she asked me what is next?  Physical exam:  Pertinent or positive findings: She appears her age and suboptimally nourished.  Facies tend to be masked.  Hair is disheveled.  She is wearing nasal oxygen .  Lower lids are puffy.  Dental hygiene is dismal with diffuse plaque and discoloration of all teeth.  They are essentially dark gray in color.  Heart sounds are distant.  The rate appears to be slow.  She has bibasilar rales, coarse at the right base and fine at the left base.  Abdomen is protuberant.  Pedal pulses are not palpable.  There is a large old operative scar over the right shin.  The superior left shin is dressed with visible extensive ecchymosis extending out from either edge  of the dressing .  Severe limb atrophy is noted.  She has interosseous wasting.  The fingernails are extremely long.  There is a faint diffuse ecchymosis of the left forehead.  General appearance: no acute distress, increased work of breathing is present.   Lymphatic: No lymphadenopathy about the  head, neck, axilla. Eyes: No conjunctival inflammation or lid edema is present. There is no scleral icterus. Ears:  External ear exam shows no significant lesions or deformities.   Nose:  External nasal examination shows no deformity or inflammation. Nasal mucosa are pink and moist without lesions, exudates Neck:  No thyromegaly, masses, tenderness noted.    Heart:  No gallop, murmur, click, rub.  Lungs:  without wheezes, rhonchi, rubs. Abdomen: Bowel sounds are normal.  Abdomen is soft and nontender with no organomegaly, hernias, masses. GU: Deferred  Extremities:  No cyanosis, clubbing, edema. Neurologic exam:  Balance, Rhomberg, finger to nose testing could not be completed due to clinical state Skin: Warm & dry w/o tenting. No significant rash.  See clinical summary under each active problem in the Problem List with associated updated therapeutic plan :  Acute on chronic heart failure with reduced ejection fraction (HFrEF, <= 40%) (HCC) CHF clinically compensated; no NVD or peripheral edema. No change in present cardiac regimen indicated.   Aspiration pneumonia (HCC) Speech Therapy to evaluate at SNF.  Continue dysphagia 2 diet.   Residual rales at the bases on exam.  Adequate oxygenation on 2 L/min  nasal flow oxygen .  Acute kidney injury superimposed on chronic kidney disease Current creatinine 1.47 with GFR of 31 indicating low stage IIIb CKD; peak recorded GFR value was 44.  .  Med list reviewed; no indication change in meds or dosages at this time.  Continue to monitor renal function.  Protein-calorie malnutrition, severe Total protein normal at 7 and albumin 3.1.  She exhibits limb atrophy and interosseous wasting on exam.  Nutritionist to assess at the SNF.  Neurocognitive deficits She has no comprehension of why she was hospitalized.  She denies any active symptoms.  MMSE will be performed at the SNF.  Hypothyroidism Current TSH therapeutic at 2.540.  Record contains no  thyroid  supplement on med list.   Karyn Meeks LPN called her former ALF; it was verified that she had not been on thyroid  supplement.   "

## 2024-05-09 NOTE — Assessment & Plan Note (Signed)
 She has no comprehension of why she was hospitalized.  She denies any active symptoms.  MMSE will be performed at the SNF.

## 2024-05-09 NOTE — Assessment & Plan Note (Signed)
 Total protein normal at 7 and albumin 3.1.  She exhibits limb atrophy and interosseous wasting on exam.  Nutritionist to assess at the SNF.

## 2024-05-09 NOTE — Patient Instructions (Signed)
 See assessment and plan under each diagnosis in the problem list and acutely for this visit

## 2024-05-09 NOTE — Assessment & Plan Note (Addendum)
 Current TSH therapeutic at 2.540.  Record contains no thyroid  supplement on med list.   Karyn Meeks LPN called her former ALF; it was verified that she had not been on thyroid  supplement.

## 2024-05-09 NOTE — Assessment & Plan Note (Addendum)
 Speech Therapy to evaluate at SNF.  Continue dysphagia 2 diet.   Residual rales at the bases on exam.  Adequate oxygenation on 2 L/min  nasal flow oxygen .

## 2024-05-09 NOTE — Assessment & Plan Note (Signed)
CHF clinically compensated; no NVD or peripheral edema. No change in present cardiac regimen indicated.  

## 2024-06-01 ENCOUNTER — Non-Acute Institutional Stay (SKILLED_NURSING_FACILITY): Payer: Self-pay | Admitting: Adult Health

## 2024-06-01 DIAGNOSIS — R29818 Other symptoms and signs involving the nervous system: Secondary | ICD-10-CM | POA: Diagnosis not present

## 2024-06-01 DIAGNOSIS — E43 Unspecified severe protein-calorie malnutrition: Secondary | ICD-10-CM | POA: Diagnosis not present

## 2024-06-01 DIAGNOSIS — I129 Hypertensive chronic kidney disease with stage 1 through stage 4 chronic kidney disease, or unspecified chronic kidney disease: Secondary | ICD-10-CM

## 2024-06-01 DIAGNOSIS — R4189 Other symptoms and signs involving cognitive functions and awareness: Secondary | ICD-10-CM

## 2024-06-01 DIAGNOSIS — N1832 Chronic kidney disease, stage 3b: Secondary | ICD-10-CM | POA: Diagnosis not present
